# Patient Record
Sex: Male | Born: 1945 | Race: White | Hispanic: No | Marital: Married | Smoking: Former smoker
Health system: Southern US, Community
[De-identification: ages and names within clinical notes are randomized; demographics above are authoritative.]

## PROBLEM LIST (undated history)

## (undated) DIAGNOSIS — N529 Male erectile dysfunction, unspecified: Secondary | ICD-10-CM

## (undated) DIAGNOSIS — G4730 Sleep apnea, unspecified: Secondary | ICD-10-CM

## (undated) DIAGNOSIS — F329 Major depressive disorder, single episode, unspecified: Secondary | ICD-10-CM

## (undated) DIAGNOSIS — C44300 Unspecified malignant neoplasm of skin of unspecified part of face: Secondary | ICD-10-CM

## (undated) DIAGNOSIS — R972 Elevated prostate specific antigen [PSA]: Secondary | ICD-10-CM

## (undated) DIAGNOSIS — F32A Depression, unspecified: Secondary | ICD-10-CM

## (undated) DIAGNOSIS — C801 Malignant (primary) neoplasm, unspecified: Secondary | ICD-10-CM

## (undated) DIAGNOSIS — E119 Type 2 diabetes mellitus without complications: Secondary | ICD-10-CM

## (undated) DIAGNOSIS — E7800 Pure hypercholesterolemia, unspecified: Secondary | ICD-10-CM

## (undated) DIAGNOSIS — F419 Anxiety disorder, unspecified: Secondary | ICD-10-CM

## (undated) DIAGNOSIS — M1990 Unspecified osteoarthritis, unspecified site: Secondary | ICD-10-CM

## (undated) HISTORY — PX: COLONOSCOPY: SHX174

## (undated) HISTORY — DX: Sleep apnea, unspecified: G47.30

## (undated) HISTORY — PX: CHOLECYSTECTOMY: SHX55

## (undated) HISTORY — PX: EYE SURGERY: SHX253

## (undated) HISTORY — PX: APPENDECTOMY: SHX54

## (undated) HISTORY — PX: ROTATOR CUFF REPAIR: SHX139

## (undated) HISTORY — PX: TONSILLECTOMY: SUR1361

---

## 1998-04-24 ENCOUNTER — Encounter: Admission: RE | Admit: 1998-04-24 | Discharge: 1998-07-23 | Payer: Self-pay | Admitting: Endocrinology

## 2000-07-27 ENCOUNTER — Ambulatory Visit (HOSPITAL_BASED_OUTPATIENT_CLINIC_OR_DEPARTMENT_OTHER): Admission: RE | Admit: 2000-07-27 | Discharge: 2000-07-27 | Payer: Self-pay | Admitting: Surgical Oncology

## 2000-07-27 ENCOUNTER — Encounter: Payer: Self-pay | Admitting: Pulmonary Disease

## 2000-09-07 ENCOUNTER — Encounter: Payer: Self-pay | Admitting: Pulmonary Disease

## 2000-09-07 ENCOUNTER — Ambulatory Visit (HOSPITAL_BASED_OUTPATIENT_CLINIC_OR_DEPARTMENT_OTHER): Admission: RE | Admit: 2000-09-07 | Discharge: 2000-09-07 | Payer: Self-pay | Admitting: Pulmonary Disease

## 2000-09-08 ENCOUNTER — Encounter: Payer: Self-pay | Admitting: Pulmonary Disease

## 2000-10-14 ENCOUNTER — Encounter: Payer: Self-pay | Admitting: Pulmonary Disease

## 2001-01-12 ENCOUNTER — Encounter: Payer: Self-pay | Admitting: Pulmonary Disease

## 2001-07-05 ENCOUNTER — Encounter: Payer: Self-pay | Admitting: Pulmonary Disease

## 2008-06-19 ENCOUNTER — Ambulatory Visit: Payer: Self-pay | Admitting: Pulmonary Disease

## 2008-06-19 DIAGNOSIS — E119 Type 2 diabetes mellitus without complications: Secondary | ICD-10-CM

## 2008-06-19 DIAGNOSIS — G4733 Obstructive sleep apnea (adult) (pediatric): Secondary | ICD-10-CM

## 2008-07-21 ENCOUNTER — Encounter: Payer: Self-pay | Admitting: Pulmonary Disease

## 2009-06-19 ENCOUNTER — Ambulatory Visit: Payer: Self-pay | Admitting: Internal Medicine

## 2009-06-29 ENCOUNTER — Encounter: Payer: Self-pay | Admitting: Pulmonary Disease

## 2010-05-15 ENCOUNTER — Encounter (INDEPENDENT_AMBULATORY_CARE_PROVIDER_SITE_OTHER): Payer: Self-pay | Admitting: *Deleted

## 2010-06-18 ENCOUNTER — Ambulatory Visit: Payer: Self-pay | Admitting: Internal Medicine

## 2011-01-05 NOTE — Letter (Signed)
Summary: Colonoscopy Date Change Letter  [facility]  [address] [place], [place] [address]   Phone: [phone]  Fax: [phone]      May 15, 2010 MRN: [id number]   Jermaine Waters [address] [place], [place]  [address]   Dear [name],   Previously you were recommended to have a repeat colonoscopy around this time. Your chart was recently reviewed by Dr. Judie Petit T. Russella Dar of [facility]. Follow up colonoscopy is now recommended in July 2014. This revised recommendation is based on current, nationally recognized guidelines for colorectal cancer screening and polyp surveillance. These guidelines are endorsed by the American Cancer Society, [employer] on Colorectal Cancer as well as numerous other major medical organizations.  Please understand that our recommendation assumes that you do not have any new symptoms such as bleeding, a change in bowel habits, anemia, or significant abdominal discomfort. If you do have any concerning GI symptoms or want to discuss the guideline recommendations, please call to arrange an office visit at your earliest convenience. Otherwise we will keep you in our reminder system and contact you 1-2 months prior to the date listed above to schedule your next colonoscopy.  Thank you,  Judie Petit T. Russella Dar, [name]  [facility] [phone]

## 2011-01-05 NOTE — Assessment & Plan Note (Signed)
Summary: f/u 1 yr///kp   Primary Provider/Referring Provider:  Eloise Harman  CC:  1 year follow up visit-sleep.  History of Present Illness:  06-24-09- OSA- for Dr Shelle Iron Replacement machine was noisier- bothers wife and also more dry mouth. nasal pilows. Same mask on old machine is less associated with open dry mouth. Humidifiers work. We believe pressure is 8 cwp/ [place]. Feels he needs ritalin 10mg  qid every day for residual daytime somnolence.  June 18, 2010- OSA- Again seen in Dr Teddy Spike absence Med list gives 2 different pressures and DME companies so I will have this checked. He says his wife is upset if he takes the mask off- a sign that it makes a difference. He would like not to need it, but does use his CPAP all night every night and denies significant daytime sleepiness. Denies discomfort, but it does restrict his movement some. Pressure is at 9 cwp.     Preventive Screening-Counseling & Management  Alcohol-Tobacco     Smoking Status: quit     Year Quit: 1981     Pack years: 1965  Allergies (verified): No Known Drug Allergies  Past History:  Past Medical History: Last updated: 06-24-09 DM (ICD-250.00) SLEEP APNEA (ICD-780.57)    Past Surgical History: Last updated: 06-24-09  appendectomy  cholecystectomy Tonsillectomy  Family History: Last updated: 2009-06-24 Father- died esophageal/ gastric cancer Mother- died metastatic cancer  Social History: Last updated: June 24, 2009 Patient states former smoker.  Married Real estate for [employer]  Risk Factors: Smoking Status: quit (06/18/2010)  Social History: Pack years:  1965  Review of Systems      See HPI  The patient denies shortness of breath with activity, shortness of breath at rest, productive cough, non-productive cough, coughing up blood, chest pain, irregular heartbeats, acid heartburn, indigestion, loss of appetite, weight change, abdominal pain, difficulty swallowing, sore throat,  tooth/dental problems, headaches, nasal congestion/difficulty breathing through nose, and sneezing.    Vital Signs:  Patient profile:   65 year old male Height:      65 inches Weight:      176.38 pounds BMI:     29.46 O2 Sat:      96 % on Room air Pulse rate:   90 / minute BP sitting:   160 / 52  (right arm) Cuff size:   regular  Vitals Entered By: Reynaldo Minium CMA (June 18, 2010 9:04 AM)  O2 Flow:  Room air CC: 1 year follow up visit-sleep   Physical Exam  Additional Exam:  General: A/Ox3; pleasant and cooperative, NAD, , calm, pleasant affect SKIN: no rash, lesions NODES: no lymphadenopathy HEENT: [place]/AT, EOM- WNL, Conjuctivae- clear, PERRLA, TM-WNL, Nose- clear, Throat- clear and wnl, Mallampati III NECK: Supple w/ fair ROM, JVD- none, normal carotid impulses w/o bruits Thyroid- normal to palpation, no stridor CHEST: Clear to P&A HEART: RRR, no m/g/r heard ABDOMEN: Soft and nl;  UJW:JXBJ, nl pulses, no edema  NEURO: Grossly intact to observation      Impression & Recommendations:  Problem # 1:  HYPERSOMNIA, ASSOCIATED WITH SLEEP APNEA (ICD-780.53)  Good control clinically, and continued good compliance with CPAP. He has 2 pressures and DME companies- I will have that cleared up. We discussed alternative treatments, but will stick with CPAP now. Verified DME + HCS ([facility] plus [facility])  Medications Added to Medication List This Visit: 1)  Humalog 100 Unit/ml Soln (Insulin lispro (human)) .... Sliding scale 2)  Cpap 9 Cwp [facility]   Other Orders: Est.  Patient Level III ([address]) DME Referral (DME)  Patient Instructions: 1)  Please schedule a follow-up appointment in 1 year. 2)  Continue CPAP    Appended Document: f/u 1 yr///kp-med list update-faxed from pharmacy    Clinical Lists Changes  Medications: Changed medication from * VYTORIN (unknown dosage) Take 1 tablet by mouth once a day to VYTORIN 10-20 MG TABS (EZETIMIBE-SIMVASTATIN) take 1  by mouth once daily Removed medication of * FLEXERIL (unknown dosage) Take 1 tablet by mouth once a day Changed medication from * METHYLPHENIDATE  10 MG Take 1 tablet by mouth four times a day to METHYLIN 10 MG TABS (METHYLPHENIDATE HCL) take 4 by mouth once daily as needed Changed medication from * ALTACE (unknown dosage) Take 1 tablet by mouth once a day to RAMIPRIL 2.5 MG CAPS (RAMIPRIL) take 1 by mouth once daily Added new medication of WELLBUTRIN SR 100 MG XR12H-TAB (BUPROPION HCL) take 1 by mouth once daily Added new medication of NEURONTIN 300 MG CAPS (GABAPENTIN) take 1 by mouth two times a day Added new medication of CIALIS 5 MG TABS (TADALAFIL) Use as directed as needed

## 2011-02-11 ENCOUNTER — Ambulatory Visit: Payer: Self-pay | Admitting: Sports Medicine

## 2011-03-02 ENCOUNTER — Ambulatory Visit (INDEPENDENT_AMBULATORY_CARE_PROVIDER_SITE_OTHER): Payer: BC Managed Care – PPO | Admitting: Sports Medicine

## 2011-03-02 ENCOUNTER — Encounter: Payer: Self-pay | Admitting: Sports Medicine

## 2011-03-02 VITALS — BP 130/60 | Ht 66.0 in | Wt 175.0 lb

## 2011-03-02 DIAGNOSIS — M1990 Unspecified osteoarthritis, unspecified site: Secondary | ICD-10-CM | POA: Insufficient documentation

## 2011-03-02 DIAGNOSIS — M25519 Pain in unspecified shoulder: Secondary | ICD-10-CM | POA: Insufficient documentation

## 2011-03-02 DIAGNOSIS — M19019 Primary osteoarthritis, unspecified shoulder: Secondary | ICD-10-CM

## 2011-03-02 MED ORDER — TRAMADOL HCL 50 MG PO TABS: 50.0000 mg | ORAL_TABLET | Freq: Two times a day (BID) | ORAL | Status: DC | PRN

## 2011-03-02 NOTE — Assessment & Plan Note (Addendum)
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 He is to work on his posture. He should do these exercises and try using tramadol 50 mg as needed for pain.   I would like to recheck him in 6 weeks.

## 2011-03-02 NOTE — Progress Notes (Signed)
  Subjective:    Patient ID: Jermaine Waters, male    DOB: 10/19/1946, 65 y.o.   MRN: [id number]  HPI Initially had difficulty with shoulder 30 years ago unsure of particular injury        Recently in past 6 months has noticed increased pain , pain described as burning sensation and sharp pain when his shoulder catches     No swelling,no brusing         Aggravated by with lateral elevation and and internal rotation         Used NSAIDS, now taking 650 mg of aspirin at bedtime for pain        Seen by Dr. Amanda Pea initially in Jan 2012- told he had spurs and inflammation in the shoulder- given CSI, Voltaren gel, given home exercises did not do them often. Has some improvement with injection for approx 30 days          No specific exercise routine currently      Review of Systems Denies weakness in arms, no tingling or numbness in arms/hands      Objective:   Physical Exam   GEN- NAD, alert and oriented  Shoulder: Right Inspection reveals no abnormalities, atrophy or asymmetry. Note mild winging of scapula with elevation to 90 degrees Palpation is normal with no tenderness over AC joint or bicipital groove.  TTP posterior aspect of deltoid ROM is full in all planes. Rotator cuff strength normal throughout. negative Neer , positive  Hawkin's tests, positive  empty can. Speeds and Yergason's tests normal. No painful arc and no drop arm sign.      MSK Ultrasound- No impingment noted, normal supraspinatus, subscap, infraspinatus, biceps tendon in groove, DJD with spurring noted on inferior aspect of glenohumerol joint, no effusion noted  Assessment & Plan:

## 2011-03-02 NOTE — Assessment & Plan Note (Signed)
See instructions above under DJD. Note that at present time we have only really evaluated his right shoulder.

## 2011-03-02 NOTE — Patient Instructions (Signed)
Use tramadol for pain twice a day as needed Shoulder blade touches: start with 3lb weights  waist level   (3 sets 15 ) Fly level- 45 degrees  (3 sets 15 ) Arm straight- back to 90 degrees  (3 sets 15) We see spurs in the posterior shoulder which catches and causes pain Your rotator cuff is normal Follow up in 6 weeks

## 2011-03-02 NOTE — Assessment & Plan Note (Signed)
DJD right shoulder Start ultram Exercises given RTC 6 weeks   Milinda Antis MD, PGY-3

## 2011-03-30 ENCOUNTER — Other Ambulatory Visit: Payer: Self-pay | Admitting: Dermatology

## 2011-04-19 ENCOUNTER — Ambulatory Visit (INDEPENDENT_AMBULATORY_CARE_PROVIDER_SITE_OTHER): Payer: BC Managed Care – PPO | Admitting: Sports Medicine

## 2011-04-19 ENCOUNTER — Encounter: Payer: Self-pay | Admitting: Sports Medicine

## 2011-04-19 VITALS — BP 137/72

## 2011-04-19 DIAGNOSIS — M25569 Pain in unspecified knee: Secondary | ICD-10-CM

## 2011-04-19 DIAGNOSIS — M25562 Pain in left knee: Secondary | ICD-10-CM | POA: Insufficient documentation

## 2011-04-19 DIAGNOSIS — M19019 Primary osteoarthritis, unspecified shoulder: Secondary | ICD-10-CM

## 2011-04-19 NOTE — Progress Notes (Signed)
  Subjective:    Patient ID: Jermaine Waters, male    DOB: [birth year], 65 y.o.   MRN: [id number]  HPI Pt is here today to F/U his R shoulder pain which he feels is about 30% better than before. He feels the exercises have helped him a lot but has noticed the pain he does, from time-to-time, experience is more anteriorly and near the subscap/pec area.   Has done exercises as suggested.  Has done one to two times daily.  Tramadol - takes once daily. Not clear if very helpful.  Pain is bad in morning and improves with activity.  Left Knee pain After sitting; hurts going down stairs. No acute injury Does not get swelling locking or giving way.   Review of Systems     Objective:   Physical Exam pleasant, NAD   Shoulder: Inspection reveals no abnormalities, atrophy or asymmetry. Palpation is normal with no tenderness over AC joint or bicipital groove. ROM is full in all planes. Rotator cuff strength normal throughout except in positions of impingement  + signs of impingement with Neer and Hawkin's tests, empty can is weak Speeds and Yergason's tests normal. No labral pathology noted with negative Obrien's, negative clunk and good stability. Normal scapular function observed. No painful arc and no drop arm sign. No apprehension sign  Knee: Normal to inspection with no erythema or effusion or obvious bony abnormalities. Palpation normal with no warmth or joint line tenderness or patellar tenderness or condyle tenderness. ROM normal in flexion and extension and lower leg rotation. Ligaments with solid consistent endpoints including ACL, PCL, LCL, MCL. Negative Mcmurray's and provocative meniscal tests.  He has painful patellar compression. Moderate crepitation in superior out quad patella Weak VMO and mildly weak at hip abduction  Patellar and quadriceps tendons unremarkable. Hamstring and quadriceps strength is normal.      Assessment & Plan:

## 2011-04-19 NOTE — Assessment & Plan Note (Signed)
He continues to make progress with his shoulder. I asked him to continue the exercises at least once daily. He can continue to use the tramadol and in fact could take more than one a day he currently uses. He did not need injection today based on his lower level of pain. He can recheck regarding this problem in 3-4 months

## 2011-04-19 NOTE — Assessment & Plan Note (Signed)
I reassured him that I did not find significant arthritis in the knee. I do think he needs to work on quadriceps strengthening. He was given some rehabilitation exercises to use for the knee and encouraged to do some biking. This seems like classic patellofemoral pain but if no resolution after 3 months I would be happy to recheck and see if he needs other treatment.

## 2011-06-15 ENCOUNTER — Other Ambulatory Visit: Payer: Self-pay | Admitting: Family Medicine

## 2011-06-16 ENCOUNTER — Encounter: Payer: Self-pay | Admitting: *Deleted

## 2011-06-16 ENCOUNTER — Encounter: Payer: Self-pay | Admitting: Internal Medicine

## 2011-06-18 ENCOUNTER — Encounter: Payer: Self-pay | Admitting: Internal Medicine

## 2011-06-18 ENCOUNTER — Ambulatory Visit (INDEPENDENT_AMBULATORY_CARE_PROVIDER_SITE_OTHER): Payer: BC Managed Care – PPO | Admitting: Internal Medicine

## 2011-06-18 VITALS — BP 114/54 | HR 80 | Ht 66.0 in | Wt 175.2 lb

## 2011-06-18 DIAGNOSIS — G4730 Sleep apnea, unspecified: Secondary | ICD-10-CM

## 2011-06-18 DIAGNOSIS — G4710 Hypersomnia, unspecified: Secondary | ICD-10-CM

## 2011-06-18 DIAGNOSIS — G4733 Obstructive sleep apnea (adult) (pediatric): Secondary | ICD-10-CM

## 2011-06-18 NOTE — Progress Notes (Signed)
  Subjective:    Patient ID: Jermaine Waters, male    DOB: [birth year], 65 y.o.   MRN: [id number]  HPI 06/18/11- 64 yoM followed for OSA complicated by DM and arthritis Last here 06/19/09- note reviewed Was fully compliant with CPAP 9 at last visit.  Now says CPAP not being used regularly. He got tired of not being able to sleep unencumbered. He tried an oral appliance for awhile, then tried just sleeping off flat of back. Wife wants him in the other room- snoring. He notes he stops breathing if he sits quietly. Admits daytime drowsiness.   Aware of deviated septum, but not bothered by it and not considering surgery.  Review of Systems Constitutional:   No weight loss, night sweats,  Fevers, chills, fatigue, lassitude. HEENT:   No headaches,  Difficulty swallowing,  Tooth/dental problems,  Sore throat,                No sneezing, itching, ear ache, nasal congestion, post nasal drip,   CV:  No chest pain,  Orthopnea, PND, swelling in lower extremities, anasarca, dizziness, palpitations  GI  No heartburn, indigestion, abdominal pain, nausea, vomiting, diarrhea, change in bowel habits, loss of appetite  Resp: No shortness of breath with exertion or at rest.  No excess mucus, no productive cough,  No non-productive cough,  No coughing up of blood.  No change in color of mucus.  No wheezing.    Skin: no rash or lesions.  GU: no dysuria, change in color of urine, no urgency or frequency.  No flank pain.  MS:  No joint pain or swelling.  No decreased range of motion.  No back pain.  Psych:  No change in mood or affect. No depression or anxiety.  No memory loss.      Objective:   Physical Exam General- Alert, Oriented, Affect-appropriate, Distress- none acute   Handlebar moustache  Skin- rash-none, lesions- none, excoriation- none  Lymphadenopathy- none  Head- atraumatic  Eyes- Gross vision intact, PERRLA, conjunctivae clear, secretions  Ears- Hearing, canals, Tm- normal  Nose- Clear,  No- , mucus, polyps, erosion, perforation    Septal deviation, narrower on left  Throat- Mallampati III , mucosa clear , drainage- none, tonsils- atrophic  Neck- flexible , trachea midline, no stridor , thyroid nl, carotid no bruit  Chest - symmetrical excursion , unlabored     Heart/CV- RRR , no murmur , no gallop  , no rub, nl s1 s2                     - JVD- none , edema- none, stasis changes- none, varices- none     Lung- clear to P&A, wheeze- none, cough- none , dullness-none, rub- none     Chest wall-   Abd- tender-no, distended-no, bowel sounds-present, HSM- no  Br/ Gen/ Rectal- Not done, not indicated  Extrem- cyanosis- none, clubbing, none, atrophy- none, strength- nl  Neuro- grossly intact to observation          Assessment & Plan:

## 2011-06-18 NOTE — Patient Instructions (Signed)
Order- [facility] schedule split protocol NPSG    Dx OSA

## 2011-06-18 NOTE — Assessment & Plan Note (Addendum)
He is intelligent and making his own choices. CPAP worked well, he just tired of using it.  We discussed the medical issues of untreated OSA and  his awareness of what may be central respiratory events at times, rather than obstructive.. We are going to update his sleep study.

## 2011-06-22 ENCOUNTER — Encounter: Payer: Self-pay | Admitting: Internal Medicine

## 2011-06-30 ENCOUNTER — Encounter: Payer: Self-pay | Admitting: Internal Medicine

## 2011-07-01 ENCOUNTER — Ambulatory Visit (HOSPITAL_BASED_OUTPATIENT_CLINIC_OR_DEPARTMENT_OTHER): Payer: BC Managed Care – PPO | Attending: Internal Medicine

## 2011-07-01 DIAGNOSIS — G4737 Central sleep apnea in conditions classified elsewhere: Secondary | ICD-10-CM | POA: Insufficient documentation

## 2011-07-01 DIAGNOSIS — G4761 Periodic limb movement disorder: Secondary | ICD-10-CM | POA: Insufficient documentation

## 2011-07-01 DIAGNOSIS — G4733 Obstructive sleep apnea (adult) (pediatric): Secondary | ICD-10-CM | POA: Insufficient documentation

## 2011-07-04 DIAGNOSIS — G4761 Periodic limb movement disorder: Secondary | ICD-10-CM

## 2011-07-04 DIAGNOSIS — G4737 Central sleep apnea in conditions classified elsewhere: Secondary | ICD-10-CM

## 2011-07-04 DIAGNOSIS — G4733 Obstructive sleep apnea (adult) (pediatric): Secondary | ICD-10-CM

## 2011-07-04 NOTE — Procedures (Signed)
Jermaine Waters, Jermaine Waters              ACCOUNT NO.:  [id number]  MEDICAL RECORD NO.:  [id number]          PATIENT TYPE:  OUT  LOCATION:  SLEEP CENTER                 FACILITY:  [facility], [place]  PHYSICIAN:  Terez Freimark D. Maple Hudson, MD, FCCP, FACPDATE OF BIRTH:  Mar 22, 1946  DATE OF STUDY:  07/01/2011                           NOCTURNAL POLYSOMNOGRAM  REFERRING PHYSICIAN:  Garhett Bernhard D Miyana Mordecai  REFERRING PHYSICIAN:  Berkeley Veldman D. Dayle Mcnerney, MD, FCCP, FACP  INDICATIONS FOR STUDY:  Hypersomnia with sleep apnea.  EPWORTH SLEEPINESS SCORE:  18/24, BMI 28.2.  Weight 175 pounds, height 66 inches.  Neck 16 inches.  Home medications are charted and reviewed.  SLEEP ARCHITECTURE:  Split study protocol.  During the diagnostic phase, total sleep time 137 minutes with sleep efficiency 89.3%.  Stage I was 12%, stage II 81.4%, stage III absent, REM 6.6% of total sleep time. Sleep latency 2.5 minutes, REM latency 140.5 minutes, awake after sleep onset 14 minutes, arousal index 70.9.  BEDTIME MEDICATION:  None.  RESPIRATORY DATA:  Split study protocol.  Apnea/hypopnea index (AHI) 48.6 per hour.  A total of 111 events was scored including 8 obstructive apneas, 6 central apneas, 1 mixed apnea, 96 hypopneas.  Events were recorded with supine sleep position.  REM AHI 46.7 per hour.  CPAP was then titrated to 7 cwp, AHI 8.1 per hour.  Residual events were central apneas.  He wore a medium ResMed Mirage FX mask with heated humidifier.  OXYGEN DATA:  Moderately loud snoring before CPAP with oxygen desaturation to a nadir of 80% on room air.  With CPAP titration, mean oxygen saturation held 96% on room air and snoring was prevented.  CARDIAC DATA:  Normal sinus rhythm.  MOVEMENT/PARASOMNIA:  Periodic limb movement with arousal.  During the diagnostic phase, 24 limb jerks were associated with 10 arousal events for an index of 4.4 per hour.  During the titration phase, 117 limb jerks were associated with 41 arousals for an index of 12  per hour.  No bathroom trips.  IMPRESSION/RECOMMENDATIONS: 1. 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 2. CPAP titration to 7 cwp, AHI 8.1 per hour.  Residual events were     central apneas.  He wore a medium ResMed Mirage FX mask with heated     humidifier.  Snoring was eliminated and oxygen saturation improved. 3. Periodic limb movement with arousal.  Limb movement with arousal     index of 4.4 per hour during the     diagnostic phase and 12 per hour during the titration phase.     Consider whether this is clinically significant in the home     environment.     Jermaine Waters D. Maple Hudson, MD, [facility], FACP Diplomate, Biomedical engineer of Sleep Medicine Electronically Signed    CDY/MEDQ  D:  07/04/2011 09:56:21  T:  07/04/2011 20:22:43  Job:  [id number]

## 2011-07-10 ENCOUNTER — Other Ambulatory Visit: Payer: Self-pay | Admitting: Family Medicine

## 2011-07-12 NOTE — Telephone Encounter (Signed)
Wrong provider. Please send to [facility]	[place]

## 2011-07-13 ENCOUNTER — Other Ambulatory Visit: Payer: Self-pay | Admitting: *Deleted

## 2011-07-13 MED ORDER — TRAMADOL HCL 50 MG PO TABS: 50.0000 mg | ORAL_TABLET | Freq: Two times a day (BID) | ORAL | Status: DC | PRN

## 2011-07-19 ENCOUNTER — Encounter: Payer: Self-pay | Admitting: Internal Medicine

## 2011-07-19 ENCOUNTER — Ambulatory Visit (INDEPENDENT_AMBULATORY_CARE_PROVIDER_SITE_OTHER): Payer: BC Managed Care – PPO | Admitting: Internal Medicine

## 2011-07-19 VITALS — BP 110/60 | HR 85 | Ht 66.0 in | Wt 178.2 lb

## 2011-07-19 DIAGNOSIS — G4710 Hypersomnia, unspecified: Secondary | ICD-10-CM

## 2011-07-19 DIAGNOSIS — G4733 Obstructive sleep apnea (adult) (pediatric): Secondary | ICD-10-CM

## 2011-07-19 NOTE — Assessment & Plan Note (Signed)
Obstructive and central apnea. The CPAP control for OSA seemed adequate at 5. For the comfort of lower pressure, we will try that, down from his current 11.  He used CPAP for 8 years and disliked the fact he couldn't sleep on stomach as preferred.  He tried oral appliance and abandoned that when it got uncomfortable  Considered and discussed Vivacvtil He chooses to try CPAP again

## 2011-07-19 NOTE — Progress Notes (Signed)
Subjective:    Patient ID: Jermaine Waters, male    DOB: 1946-11-11, 65 y.o.   MRN: [id number]  HPI  Subjective:    Patient ID: Jermaine Waters, male    DOB: [birth year], 65 y.o.   MRN: [id number]  HPI 06/18/11- 64 yoM followed for OSA complicated by DM and arthritis Last here 06/19/09- note reviewed Was fully compliant with CPAP 9 at last visit.  Now says CPAP not being used regularly. He got tired of not being able to sleep unencumbered. He tried an oral appliance for awhile, then tried just sleeping off flat of back. Wife wants him in the other room- snoring. He notes he stops breathing if he sits quietly. Admits daytime drowsiness.   Aware of deviated septum, but not bothered by it and not considering surgery.   07/19/11-  64 yoM followed for OSA complicated by DM and arthritis NPSG 07/01/11 with residual apneas. 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 CPAP titration to 7 gave an AHI of 8.1 per hour -report reviewed with him in detail.  Extra time was spent discussing the medical implications and available treatments. We discussed his experience with CPAP masks and oral appliances previously.  Review of Systems Constitutional:   No weight loss, night sweats,  Fevers, chills, fatigue, lassitude. HEENT:   No headaches,  Difficulty swallowing,  Tooth/dental problems,  Sore throat,                No sneezing, itching, ear ache, nasal congestion, post nasal drip,  CV:  No chest pain,  Orthopnea, PND, swelling in lower extremities, anasarca, dizziness, palpitations GI  No heartburn, indigestion, abdominal pain, nausea, vomiting, diarrhea, change in bowel habits, loss of appetite Resp: No shortness of breath with exertion or at rest.  No excess mucus, no productive cough,  No non-productive cough,  No coughing up of blood.  No change in color of mucus.  No wheezing.   Skin: no rash or lesions. GU: no dysuria, change in color of urine, no urgency or frequency.   No flank pain. MS:  No joint pain or swelling.  No decreased range of motion.  No back pain. Psych:  No change in mood or affect. No depression or anxiety.  No memory loss.   Objective:   Physical Exam General- Alert, Oriented, Affect-appropriate, Distress- none acute   handlebar mustache Skin- rash-none, lesions- none, excoriation- none Lymphadenopathy- none Head- atraumatic            Eyes- Gross vision intact, PERRLA, conjunctivae clear secretions            Ears- Hearing, canals normal            Nose- Clear, no-Septal dev, mucus, polyps, erosion, perforation             Throat- Mallampati III , mucosa clear , drainage- none, tonsils- atrophic Neck- flexible , trachea midline, no stridor , thyroid nl, carotid no bruit Chest - symmetrical excursion , unlabored           Heart/CV- RRR , no murmur , no gallop  , no rub, nl s1 s2                           - JVD- none , edema- none, stasis changes- none, varices- none           Lung- clear to P&A, wheeze- none, cough- none , dullness-none, rub- none  Chest wall-  Abd- tender-no, distended-no, bowel sounds-present, HSM- no Br/ Gen/ Rectal- Not done, not indicated Extrem- cyanosis- none, clubbing, none, atrophy- none, strength- nl Neuro- grossly intact to observation           Assessment & Plan:     Review of Systems     Objective:   Physical Exam        Assessment & Plan:

## 2011-07-19 NOTE — Patient Instructions (Signed)
Order- [facility]- new CPAP 5 cwp, mask of choice, humidifier dx OSA                                          (Look at nasal pillow masks as one option)

## 2011-08-25 ENCOUNTER — Ambulatory Visit (INDEPENDENT_AMBULATORY_CARE_PROVIDER_SITE_OTHER): Payer: Self-pay | Admitting: Ophthalmology

## 2011-08-25 DIAGNOSIS — E11319 Type 2 diabetes mellitus with unspecified diabetic retinopathy without macular edema: Secondary | ICD-10-CM

## 2011-08-25 DIAGNOSIS — H2510 Age-related nuclear cataract, unspecified eye: Secondary | ICD-10-CM

## 2011-08-25 DIAGNOSIS — E11359 Type 2 diabetes mellitus with proliferative diabetic retinopathy without macular edema: Secondary | ICD-10-CM

## 2011-08-25 DIAGNOSIS — H43819 Vitreous degeneration, unspecified eye: Secondary | ICD-10-CM

## 2011-08-27 ENCOUNTER — Other Ambulatory Visit: Payer: Self-pay | Admitting: *Deleted

## 2011-08-27 DIAGNOSIS — M25562 Pain in left knee: Secondary | ICD-10-CM

## 2011-08-31 ENCOUNTER — Encounter: Payer: Self-pay | Admitting: *Deleted

## 2011-08-31 ENCOUNTER — Telehealth: Payer: Self-pay | Admitting: *Deleted

## 2011-08-31 NOTE — Telephone Encounter (Signed)
Message copied by Mora Bellman on Tue Aug 31, 2011  4:58 PM ------      Message from: Enid Baas      Created: Mon Aug 30, 2011  8:47 AM      Regarding: RE: patient is requesting getting an mri before his appt here 10/1      Contact: [phone]       If he really isn't much better I think the next step may be to refer him to see a shoulder surgeon who may want to repeat plain Xrays or do special views before an MRI.  We found an area of DJD that MRI does not show this that well.  So maybe we should refer rather than do MRI and follow up visit on Oct 1      ----- Message -----         From: Resa Miner, RN         Sent: 08/27/2011  10:49 AM           To: Enid Baas, MD      Subject: FW: patient is requesting getting an mri bef#            Pt states his shoulder has gotten any better, and at last visit you said next step was MRI.  If you want him to have the MRI, he wants to have it before his appt Oct. 1.             ----- Message -----         From: Lizbeth Bark         Sent: 08/26/2011   4:39 PM           To: Rolfe Hartsell Phillis Haggis, RN      Subject: patient is requesting getting an mri before #

## 2011-08-31 NOTE — Telephone Encounter (Signed)
Pt discussed with Dr. Darrick Penna and decided he wants to try acupuncture before seeing the surgeon.  He had already seen Dr. Amanda Pea before coming to our office for 2nd opinion.  Referral [facility] faxed to [facility].

## 2011-09-06 ENCOUNTER — Ambulatory Visit: Payer: BC Managed Care – PPO | Admitting: Sports Medicine

## 2011-09-08 ENCOUNTER — Encounter (INDEPENDENT_AMBULATORY_CARE_PROVIDER_SITE_OTHER): Payer: BC Managed Care – PPO | Admitting: Ophthalmology

## 2011-09-08 DIAGNOSIS — E11359 Type 2 diabetes mellitus with proliferative diabetic retinopathy without macular edema: Secondary | ICD-10-CM

## 2011-09-09 ENCOUNTER — Ambulatory Visit: Payer: BC Managed Care – PPO | Admitting: Internal Medicine

## 2011-10-12 ENCOUNTER — Ambulatory Visit: Payer: BC Managed Care – PPO | Admitting: Sports Medicine

## 2011-10-13 ENCOUNTER — Telehealth: Payer: Self-pay | Admitting: *Deleted

## 2011-10-13 NOTE — Telephone Encounter (Signed)
Dr. Darrick Penna reviewed pt's shoulder x-ray and said he had spurring, and next step for treatment would be surgery consultation.

## 2011-10-13 NOTE — Telephone Encounter (Signed)
Message copied by Mora Bellman on Wed Oct 13, 2011 12:14 PM ------      Message from: Enid Baas      Created: Tue Oct 12, 2011 10:09 AM       This pt cancelled w ? Of MRI.      We need to do plane XRay of shoulder first and then recheck.  If diagnosis not confirmed on plane XRay the MRI would probably get approved.      However since we last saw him in May he would have to be rechecked before we could order MRI but not plain Xray

## 2011-10-13 NOTE — Telephone Encounter (Signed)
Pt states he would still like to try accupuncture before going back to surgeon.  Referral has already been faxed to [facility].  Also, asked pt if he was using tramadol for pain- he states he takes it usually in the mornings.  Advised this is a good medication for arthritis, and to call if he needs a refill.  Pt agreeable.

## 2011-10-13 NOTE — Telephone Encounter (Signed)
Message copied by Mora Bellman on Wed Oct 13, 2011 12:28 PM ------      Message from: Enid Baas      Created: Tue Oct 12, 2011 10:09 AM       This pt cancelled w ? Of MRI.      We need to do plane XRay of shoulder first and then recheck.  If diagnosis not confirmed on plane XRay the MRI would probably get approved.      However since we last saw him in May he would have to be rechecked before we could order MRI but not plain Xray

## 2011-12-21 ENCOUNTER — Other Ambulatory Visit: Payer: Self-pay | Admitting: *Deleted

## 2011-12-21 MED ORDER — TRAMADOL HCL 50 MG PO TABS: 50.0000 mg | ORAL_TABLET | Freq: Two times a day (BID) | ORAL | Status: AC | PRN

## 2012-01-10 ENCOUNTER — Ambulatory Visit (INDEPENDENT_AMBULATORY_CARE_PROVIDER_SITE_OTHER): Payer: BC Managed Care – PPO | Admitting: Ophthalmology

## 2012-01-10 DIAGNOSIS — E11359 Type 2 diabetes mellitus with proliferative diabetic retinopathy without macular edema: Secondary | ICD-10-CM

## 2012-01-10 DIAGNOSIS — H2510 Age-related nuclear cataract, unspecified eye: Secondary | ICD-10-CM

## 2012-01-10 DIAGNOSIS — H43819 Vitreous degeneration, unspecified eye: Secondary | ICD-10-CM

## 2012-01-10 DIAGNOSIS — E1065 Type 1 diabetes mellitus with hyperglycemia: Secondary | ICD-10-CM

## 2012-06-09 ENCOUNTER — Telehealth: Payer: Self-pay | Admitting: Internal Medicine

## 2012-06-09 DIAGNOSIS — G4733 Obstructive sleep apnea (adult) (pediatric): Secondary | ICD-10-CM

## 2012-06-09 NOTE — Telephone Encounter (Signed)
Called, spoke with pt.  I informed him of below per Dr. Maple Hudson.  Pt states he had a sleep study done last year and wants to know if this data would be sufficient.  Dr. Maple Hudson, pls advise.  Thank you.

## 2012-06-09 NOTE — Telephone Encounter (Signed)
Per CY-order ONOX on ROOM AIR and his CPAP dx OSA.

## 2012-06-09 NOTE — Telephone Encounter (Signed)
Per CY-no this is not sufficient enough for the issue at hand; patient needs to have ONOX of ROOM AIR and his CPAP dx OSA.

## 2012-06-09 NOTE — Telephone Encounter (Signed)
Called, spoke with pt.  Informed him of below per Dr. Maple Hudson.  He verbalized understanding of this and is aware order will be sent to [facility] who will contact him to set this up.  He verbalized understanding of this and voiced no further questions/concerns at this time.

## 2012-06-09 NOTE — Telephone Encounter (Signed)
Spoke with pt. He states that he has recently had multiple laser surgeries to help fix his retina problems and after consulting with ophthalmologist and doing research on retinal problems, he feels that maybe he is not getting enough o2 and this is the cause of retinal problems. He wants to know if he needs to add o2 to CPAP. Please advise recs thanks!

## 2012-06-13 ENCOUNTER — Ambulatory Visit: Payer: BC Managed Care – PPO | Admitting: Internal Medicine

## 2012-07-06 ENCOUNTER — Encounter: Payer: Self-pay | Admitting: Internal Medicine

## 2012-07-10 ENCOUNTER — Ambulatory Visit (INDEPENDENT_AMBULATORY_CARE_PROVIDER_SITE_OTHER): Payer: BC Managed Care – PPO | Admitting: Ophthalmology

## 2012-07-26 ENCOUNTER — Encounter (INDEPENDENT_AMBULATORY_CARE_PROVIDER_SITE_OTHER): Payer: BC Managed Care – PPO | Admitting: Ophthalmology

## 2012-07-26 DIAGNOSIS — H2510 Age-related nuclear cataract, unspecified eye: Secondary | ICD-10-CM

## 2012-07-26 DIAGNOSIS — E11359 Type 2 diabetes mellitus with proliferative diabetic retinopathy without macular edema: Secondary | ICD-10-CM

## 2012-07-26 DIAGNOSIS — H43819 Vitreous degeneration, unspecified eye: Secondary | ICD-10-CM

## 2012-07-26 DIAGNOSIS — E1065 Type 1 diabetes mellitus with hyperglycemia: Secondary | ICD-10-CM

## 2013-01-26 ENCOUNTER — Ambulatory Visit (INDEPENDENT_AMBULATORY_CARE_PROVIDER_SITE_OTHER): Payer: BC Managed Care – PPO | Admitting: Ophthalmology

## 2013-01-26 DIAGNOSIS — H43819 Vitreous degeneration, unspecified eye: Secondary | ICD-10-CM

## 2013-01-26 DIAGNOSIS — H2510 Age-related nuclear cataract, unspecified eye: Secondary | ICD-10-CM

## 2013-01-26 DIAGNOSIS — E1039 Type 1 diabetes mellitus with other diabetic ophthalmic complication: Secondary | ICD-10-CM

## 2013-01-26 DIAGNOSIS — E11359 Type 2 diabetes mellitus with proliferative diabetic retinopathy without macular edema: Secondary | ICD-10-CM

## 2013-03-27 ENCOUNTER — Encounter (HOSPITAL_COMMUNITY): Payer: Self-pay | Admitting: Pharmacy Technician

## 2013-03-28 NOTE — Pre-Procedure Instructions (Signed)
Jermaine Waters  03/28/2013   Your procedure is scheduled on:  Thursday Apr 05, 2013.  Report to [facility] at 10:30 AM.  Call this number if you have problems the morning of surgery: [phone]   Remember:   Do not eat food or drink liquids after midnight.   Take these medicines the morning of surgery with A SIP OF WATER: Bupropion (Wellbutrin), Methylphenidate (Ritalin)  Do not take any diabetic medications the morning of surgery   Do not wear jewelry  Do not wear lotions or colognes.  Men may shave face and neck.  Do not bring valuables to the hospital.  Contacts, dentures or bridgework may not be worn into surgery.  Leave suitcase in the car. After surgery it may be brought to your room.  For patients admitted to the hospital, checkout time is 11:00 AM the day of  discharge.   Patients discharged the day of surgery will not be allowed to drive  home.  Name and phone number of your driver: Family/Friend  Special Instructions: Shower using CHG 2 nights before surgery and the night before surgery.  If you shower the day of surgery use CHG.  Use special wash - you have one bottle of CHG for all showers.  You should use approximately 1/3 of the bottle for each shower.   Please read over the following fact sheets that you were given: Pain Booklet, Coughing and Deep Breathing, MRSA Information and Surgical Site Infection Prevention

## 2013-03-29 ENCOUNTER — Encounter (HOSPITAL_COMMUNITY)
Admission: RE | Admit: 2013-03-29 | Discharge: 2013-03-29 | Disposition: A | Discharge status: Home / Self Care | Payer: Federal, State, Local not specified - PPO | Source: Ambulatory Visit | Attending: Orthopedic Surgery | Admitting: Orthopedic Surgery

## 2013-03-29 ENCOUNTER — Ambulatory Visit (HOSPITAL_COMMUNITY)
Admission: RE | Admit: 2013-03-29 | Discharge: 2013-03-29 | Disposition: A | Discharge status: Home / Self Care | Payer: Federal, State, Local not specified - PPO | Source: Ambulatory Visit | Attending: Orthopedic Surgery | Admitting: Orthopedic Surgery

## 2013-03-29 ENCOUNTER — Encounter (HOSPITAL_COMMUNITY): Payer: Self-pay

## 2013-03-29 DIAGNOSIS — E119 Type 2 diabetes mellitus without complications: Secondary | ICD-10-CM | POA: Insufficient documentation

## 2013-03-29 DIAGNOSIS — Z01812 Encounter for preprocedural laboratory examination: Secondary | ICD-10-CM | POA: Insufficient documentation

## 2013-03-29 DIAGNOSIS — G4733 Obstructive sleep apnea (adult) (pediatric): Secondary | ICD-10-CM | POA: Insufficient documentation

## 2013-03-29 DIAGNOSIS — Z01818 Encounter for other preprocedural examination: Secondary | ICD-10-CM | POA: Insufficient documentation

## 2013-03-29 DIAGNOSIS — M129 Arthropathy, unspecified: Secondary | ICD-10-CM | POA: Insufficient documentation

## 2013-03-29 DIAGNOSIS — F3289 Other specified depressive episodes: Secondary | ICD-10-CM | POA: Insufficient documentation

## 2013-03-29 DIAGNOSIS — Z01810 Encounter for preprocedural cardiovascular examination: Secondary | ICD-10-CM | POA: Insufficient documentation

## 2013-03-29 DIAGNOSIS — F329 Major depressive disorder, single episode, unspecified: Secondary | ICD-10-CM | POA: Insufficient documentation

## 2013-03-29 DIAGNOSIS — Z87891 Personal history of nicotine dependence: Secondary | ICD-10-CM | POA: Insufficient documentation

## 2013-03-29 DIAGNOSIS — Z85828 Personal history of other malignant neoplasm of skin: Secondary | ICD-10-CM | POA: Insufficient documentation

## 2013-03-29 DIAGNOSIS — E7800 Pure hypercholesterolemia, unspecified: Secondary | ICD-10-CM | POA: Insufficient documentation

## 2013-03-29 HISTORY — DX: Malignant (primary) neoplasm, unspecified: C80.1

## 2013-03-29 HISTORY — DX: Major depressive disorder, single episode, unspecified: F32.9

## 2013-03-29 HISTORY — DX: Unspecified osteoarthritis, unspecified site: M19.90

## 2013-03-29 HISTORY — DX: Depression, unspecified: F32.A

## 2013-03-29 HISTORY — DX: Pure hypercholesterolemia, unspecified: E78.00

## 2013-03-29 HISTORY — DX: Male erectile dysfunction, unspecified: N52.9

## 2013-03-29 LAB — BASIC METABOLIC PANEL
Calcium: 8.8 mg/dL (ref 8.4–10.5)
GFR calc Af Amer: >90 mL/min (ref >90)
GFR calc non Af Amer: >90 mL/min (ref >90)
Glucose, Bld: 289 mg/dL — ABNORMAL HIGH (ref 70–99)
Sodium: 138 mEq/L (ref 135–145)

## 2013-03-29 LAB — CBC
MCH: 30.5 pg (ref 26.0–34.0)
MCHC: 34.9 g/dL (ref 30.0–36.0)
Platelets: 216 10*3/uL (ref 150–400)
RBC: 5.05 MIL/uL (ref 4.22–5.81)

## 2013-03-29 LAB — SURGICAL PCR SCREEN: MRSA, PCR: NEGATIVE

## 2013-03-29 NOTE — Progress Notes (Signed)
Patient denied having any cardiac or pulmonary problems. PCP is Dr. Brunilda Payor at [facility]. Patient instructed not to take any diabetic medications the morning of surgery. Patient verbalized understanding.

## 2013-03-30 NOTE — Progress Notes (Signed)
Chart review: Patient is a 67 year old male scheduled for right shoulder arthroscopy with subacromial decompression, distal clavicle resection, and rotator cuff repair by Dr. Rennis Chris on 04/05/2013. History includes diabetes mellitus type 2, obstructive sleep apnea with CPAP use, hypercholesterolemia, former smoker, ED, depression, arthritis , skin cancer, tonsillectomy, appendectomy, cholecystectomy. PCP is listed as Dr. Jarome Matin.  EKG on 03/29/2013 showed sinus rhythm with PACs.  Chest x-ray on 03/29/2013 showed no edema or consolidation.  Preoperative labs noted. Nonfasting glucose of 289. He will get a fasting CBG on arrival.  If his fasting glucose is not significantly elevated then would anticipate he could proceed as planned.  Velna Ochs [facility]/Anesthesiology Phone [phone] 03/30/2013 3:16 PM

## 2013-04-04 MED ORDER — DEXTROSE 5 % IV SOLN
3.0000 g | INTRAVENOUS | Status: AC
  Administered 2013-04-05: 3 g via INTRAVENOUS
  Filled 2013-04-04: qty 3000

## 2013-04-05 ENCOUNTER — Ambulatory Visit (HOSPITAL_COMMUNITY)
Admission: RE | Admit: 2013-04-05 | Discharge: 2013-04-05 | Disposition: A | Discharge status: Home / Self Care | Payer: Federal, State, Local not specified - PPO | Source: Ambulatory Visit | Attending: Orthopedic Surgery | Admitting: Orthopedic Surgery

## 2013-04-05 ENCOUNTER — Encounter (HOSPITAL_COMMUNITY)
Admission: RE | Disposition: A | Discharge status: Home / Self Care | Payer: Self-pay | Source: Ambulatory Visit | Attending: Orthopedic Surgery

## 2013-04-05 ENCOUNTER — Encounter (HOSPITAL_COMMUNITY): Payer: Self-pay

## 2013-04-05 ENCOUNTER — Encounter (HOSPITAL_COMMUNITY): Payer: Self-pay | Admitting: Vascular Surgery

## 2013-04-05 ENCOUNTER — Ambulatory Visit (HOSPITAL_COMMUNITY): Payer: Federal, State, Local not specified - PPO | Admitting: Anesthesiology

## 2013-04-05 DIAGNOSIS — M19019 Primary osteoarthritis, unspecified shoulder: Secondary | ICD-10-CM | POA: Insufficient documentation

## 2013-04-05 DIAGNOSIS — E119 Type 2 diabetes mellitus without complications: Secondary | ICD-10-CM | POA: Insufficient documentation

## 2013-04-05 DIAGNOSIS — F3289 Other specified depressive episodes: Secondary | ICD-10-CM | POA: Insufficient documentation

## 2013-04-05 DIAGNOSIS — M25819 Other specified joint disorders, unspecified shoulder: Secondary | ICD-10-CM | POA: Insufficient documentation

## 2013-04-05 DIAGNOSIS — M129 Arthropathy, unspecified: Secondary | ICD-10-CM | POA: Insufficient documentation

## 2013-04-05 DIAGNOSIS — F329 Major depressive disorder, single episode, unspecified: Secondary | ICD-10-CM | POA: Insufficient documentation

## 2013-04-05 DIAGNOSIS — E7800 Pure hypercholesterolemia, unspecified: Secondary | ICD-10-CM | POA: Insufficient documentation

## 2013-04-05 DIAGNOSIS — Z794 Long term (current) use of insulin: Secondary | ICD-10-CM | POA: Insufficient documentation

## 2013-04-05 DIAGNOSIS — M719 Bursopathy, unspecified: Secondary | ICD-10-CM | POA: Insufficient documentation

## 2013-04-05 DIAGNOSIS — M67919 Unspecified disorder of synovium and tendon, unspecified shoulder: Secondary | ICD-10-CM | POA: Insufficient documentation

## 2013-04-05 DIAGNOSIS — M24819 Other specific joint derangements of unspecified shoulder, not elsewhere classified: Secondary | ICD-10-CM | POA: Insufficient documentation

## 2013-04-05 DIAGNOSIS — G4730 Sleep apnea, unspecified: Secondary | ICD-10-CM | POA: Insufficient documentation

## 2013-04-05 HISTORY — PX: SHOULDER ARTHROSCOPY WITH ROTATOR CUFF REPAIR AND SUBACROMIAL DECOMPRESSION: SHX5686

## 2013-04-05 LAB — GLUCOSE, CAPILLARY: Glucose-Capillary: 96 mg/dL (ref 70–99)

## 2013-04-05 SURGERY — SHOULDER ARTHROSCOPY WITH ROTATOR CUFF REPAIR AND SUBACROMIAL DECOMPRESSION
Anesthesia: Regional | Site: Shoulder | Laterality: Right | Wound class: Clean

## 2013-04-05 MED ORDER — OXYCODONE HCL 5 MG/5ML PO SOLN: 5.0000 mg | Freq: Once | ORAL | Status: DC | PRN

## 2013-04-05 MED ORDER — PROPOFOL 10 MG/ML IV BOLUS
INTRAVENOUS | Status: DC | PRN
  Administered 2013-04-05: 200 mg via INTRAVENOUS

## 2013-04-05 MED ORDER — OXYCODONE-ACETAMINOPHEN 5-325 MG PO TABS: 1.0000 | ORAL_TABLET | ORAL | Status: DC | PRN

## 2013-04-05 MED ORDER — SODIUM CHLORIDE 0.9 % IR SOLN
Status: DC | PRN
  Administered 2013-04-05: 3000 mL

## 2013-04-05 MED ORDER — FENTANYL CITRATE 0.05 MG/ML IJ SOLN
INTRAMUSCULAR | Status: DC | PRN
  Administered 2013-04-05: 50 ug via INTRAVENOUS

## 2013-04-05 MED ORDER — FENTANYL CITRATE 0.05 MG/ML IJ SOLN: 100.0000 ug | Freq: Once | INTRAMUSCULAR | Status: AC

## 2013-04-05 MED ORDER — ONDANSETRON HCL 4 MG/2ML IJ SOLN
INTRAMUSCULAR | Status: DC | PRN
  Administered 2013-04-05: 4 mg via INTRAVENOUS

## 2013-04-05 MED ORDER — OXYCODONE HCL 5 MG PO TABS: 5.0000 mg | ORAL_TABLET | Freq: Once | ORAL | Status: DC | PRN

## 2013-04-05 MED ORDER — NAPROXEN 500 MG PO TABS: 500.0000 mg | ORAL_TABLET | Freq: Two times a day (BID) | ORAL | Status: DC

## 2013-04-05 MED ORDER — TEMAZEPAM 30 MG PO CAPS: 30.0000 mg | ORAL_CAPSULE | Freq: Every evening | ORAL | Status: DC | PRN

## 2013-04-05 MED ORDER — GLYCOPYRROLATE 0.2 MG/ML IJ SOLN
INTRAMUSCULAR | Status: DC | PRN
  Administered 2013-04-05: 0.4 mg via INTRAVENOUS

## 2013-04-05 MED ORDER — ROCURONIUM BROMIDE 100 MG/10ML IV SOLN
INTRAVENOUS | Status: DC | PRN
  Administered 2013-04-05: 50 mg via INTRAVENOUS

## 2013-04-05 MED ORDER — SODIUM CHLORIDE 0.9 % IV SOLN
10.0000 mg | INTRAVENOUS | Status: DC | PRN
  Administered 2013-04-05: 20 ug/min via INTRAVENOUS

## 2013-04-05 MED ORDER — LACTATED RINGERS IV SOLN
INTRAVENOUS | Status: DC
  Administered 2013-04-05 (×2): via INTRAVENOUS

## 2013-04-05 MED ORDER — BUPIVACAINE-EPINEPHRINE PF 0.5-1:200000 % IJ SOLN
INTRAMUSCULAR | Status: DC | PRN
  Administered 2013-04-05: 30 mL

## 2013-04-05 MED ORDER — FENTANYL CITRATE 0.05 MG/ML IJ SOLN
INTRAMUSCULAR | Status: AC
  Administered 2013-04-05: 100 ug via INTRAVENOUS
  Filled 2013-04-05: qty 2

## 2013-04-05 MED ORDER — MIDAZOLAM HCL 2 MG/2ML IJ SOLN
INTRAMUSCULAR | Status: AC
  Administered 2013-04-05: 2 mg
  Filled 2013-04-05: qty 2

## 2013-04-05 MED ORDER — PHENYLEPHRINE HCL 10 MG/ML IJ SOLN
INTRAMUSCULAR | Status: DC | PRN
  Administered 2013-04-05 (×3): 80 ug via INTRAVENOUS

## 2013-04-05 MED ORDER — CYCLOBENZAPRINE HCL 10 MG PO TABS
10.0000 mg | ORAL_TABLET | Freq: Three times a day (TID) | ORAL | Status: DC | PRN
Start: 2013-04-05 — End: 2014-02-27

## 2013-04-05 MED ORDER — FENTANYL CITRATE 0.05 MG/ML IJ SOLN: 50.0000 ug | INTRAMUSCULAR | Status: DC | PRN

## 2013-04-05 MED ORDER — ACETAMINOPHEN 10 MG/ML IV SOLN
1000.0000 mg | Freq: Four times a day (QID) | INTRAVENOUS | Status: DC
  Administered 2013-04-05: 1000 mg via INTRAVENOUS

## 2013-04-05 MED ORDER — HYDROMORPHONE HCL PF 1 MG/ML IJ SOLN: 0.2500 mg | INTRAMUSCULAR | Status: DC | PRN

## 2013-04-05 MED ORDER — MIDAZOLAM HCL 2 MG/2ML IJ SOLN: 1.0000 mg | INTRAMUSCULAR | Status: DC | PRN

## 2013-04-05 MED ORDER — NEOSTIGMINE METHYLSULFATE 1 MG/ML IJ SOLN
INTRAMUSCULAR | Status: DC | PRN
  Administered 2013-04-05: 3 mg via INTRAVENOUS

## 2013-04-05 MED ORDER — CHLORHEXIDINE GLUCONATE 4 % EX LIQD: 60.0000 mL | Freq: Once | CUTANEOUS | Status: DC

## 2013-04-05 MED ORDER — ACETAMINOPHEN 10 MG/ML IV SOLN
INTRAVENOUS | Status: AC
  Filled 2013-04-05: qty 100

## 2013-04-05 MED ORDER — LIDOCAINE HCL (CARDIAC) 20 MG/ML IV SOLN
INTRAVENOUS | Status: DC | PRN
  Administered 2013-04-05: 75 mg via INTRAVENOUS

## 2013-04-05 MED ORDER — ONDANSETRON HCL 4 MG/2ML IJ SOLN: 4.0000 mg | Freq: Four times a day (QID) | INTRAMUSCULAR | Status: DC | PRN

## 2013-04-05 SURGICAL SUPPLY — 64 items
ANCH SUT 2 CRKSRW FT 14X4.5 (Anchor) ×1 IMPLANT
ANCH SUT SWLK 19.1X4.75 VT (Anchor) ×2 IMPLANT
ANCHOR PEEK 4.75X19.1 SWLK C (Anchor) ×2 IMPLANT
ANCHOR PEEK CORKSCREW 4.5 (Anchor) ×1 IMPLANT
BLADE CUTTER GATOR 3.5 (BLADE) ×2 IMPLANT
BLADE GREAT WHITE 4.2 (BLADE) ×2 IMPLANT
BLADE SURG 11 STRL SS (BLADE) ×2 IMPLANT
BOOTCOVER CLEANROOM LRG (PROTECTIVE WEAR) ×4 IMPLANT
BUR 3.5 LG SPHERICAL (BURR) IMPLANT
BUR OVAL 4.0 (BURR) ×2 IMPLANT
BURR 3.5 LG SPHERICAL (BURR)
CANISTER SUCT LVC 12 LTR MEDI- (MISCELLANEOUS) ×2 IMPLANT
CANNULA ACUFLEX KIT 5X76 (CANNULA) ×2 IMPLANT
CANNULA DRILOCK 5.0X75 (CANNULA) ×3 IMPLANT
CLOTH BEACON ORANGE TIMEOUT ST (SAFETY) ×2 IMPLANT
CLSR STERI-STRIP ANTIMIC 1/2X4 (GAUZE/BANDAGES/DRESSINGS) ×1 IMPLANT
CONNECTOR 5 IN 1 STRAIGHT STRL (MISCELLANEOUS) ×2 IMPLANT
DRAPE INCISE 23X17 IOBAN STRL (DRAPES) ×1
DRAPE INCISE 23X17 STRL (DRAPES) ×1 IMPLANT
DRAPE INCISE IOBAN 23X17 STRL (DRAPES) ×1 IMPLANT
DRAPE INCISE IOBAN 66X45 STRL (DRAPES) ×2 IMPLANT
DRAPE STERI 35X30 U-POUCH (DRAPES) ×2 IMPLANT
DRAPE SURG 17X11 SM STRL (DRAPES) ×2 IMPLANT
DRAPE U-SHAPE 47X51 STRL (DRAPES) ×2 IMPLANT
DRSG PAD ABDOMINAL 8X10 ST (GAUZE/BANDAGES/DRESSINGS) ×3 IMPLANT
DURAPREP 26ML APPLICATOR (WOUND CARE) ×4 IMPLANT
ELECT REM PT RETURN 9FT ADLT (ELECTROSURGICAL)
ELECTRODE REM PT RTRN 9FT ADLT (ELECTROSURGICAL) ×1 IMPLANT
GLOVE BIO SURGEON STRL SZ7.5 (GLOVE) ×2 IMPLANT
GLOVE BIO SURGEON STRL SZ8 (GLOVE) ×2 IMPLANT
GLOVE EUDERMIC 7 POWDERFREE (GLOVE) ×2 IMPLANT
GLOVE SS BIOGEL STRL SZ 7.5 (GLOVE) ×1 IMPLANT
GLOVE SUPERSENSE BIOGEL SZ 7.5 (GLOVE) ×1
GOWN STRL NON-REIN LRG LVL3 (GOWN DISPOSABLE) ×2 IMPLANT
GOWN STRL REIN XL XLG (GOWN DISPOSABLE) ×8 IMPLANT
KIT BASIN OR (CUSTOM PROCEDURE TRAY) ×2 IMPLANT
KIT ROOM TURNOVER OR (KITS) ×2 IMPLANT
KIT SHOULDER TRACTION (DRAPES) ×2 IMPLANT
MANIFOLD NEPTUNE II (INSTRUMENTS) ×2 IMPLANT
NDL SPNL 18GX3.5 QUINCKE PK (NEEDLE) ×1 IMPLANT
NDL SUT 6 .5 CRC .975X.05 MAYO (NEEDLE) IMPLANT
NEEDLE MAYO TAPER (NEEDLE) ×2
NEEDLE SPNL 18GX3.5 QUINCKE PK (NEEDLE) ×2 IMPLANT
NS IRRIG 1000ML POUR BTL (IV SOLUTION) ×2 IMPLANT
PACK SHOULDER (CUSTOM PROCEDURE TRAY) ×2 IMPLANT
PAD ARMBOARD 7.5X6 YLW CONV (MISCELLANEOUS) ×4 IMPLANT
SET ARTHROSCOPY TUBING (MISCELLANEOUS) ×2
SET ARTHROSCOPY TUBING LN (MISCELLANEOUS) ×1 IMPLANT
SLING ARM FOAM STRAP LRG (SOFTGOODS) ×1 IMPLANT
SLING ARM FOAM STRAP MED (SOFTGOODS) ×1 IMPLANT
SLING SWATHE LARGE (SOFTGOODS) ×1 IMPLANT
SPONGE GAUZE 4X4 12PLY (GAUZE/BANDAGES/DRESSINGS) ×2 IMPLANT
SPONGE LAP 4X18 X RAY DECT (DISPOSABLE) ×2 IMPLANT
STRIP CLOSURE SKIN 1/2X4 (GAUZE/BANDAGES/DRESSINGS) ×2 IMPLANT
SUT MNCRL AB 3-0 PS2 18 (SUTURE) ×2 IMPLANT
SUT PDS AB 0 CT 36 (SUTURE) IMPLANT
SUT RETRIEVER GRASP 30 DEG (SUTURE) ×1 IMPLANT
SYR 20CC LL (SYRINGE) ×2 IMPLANT
TAPE PAPER 2X10 WHT MICROPORE (GAUZE/BANDAGES/DRESSINGS) ×1 IMPLANT
TAPE PAPER 3X10 WHT MICROPORE (GAUZE/BANDAGES/DRESSINGS) ×2 IMPLANT
TOWEL OR 17X24 6PK STRL BLUE (TOWEL DISPOSABLE) ×2 IMPLANT
TOWEL OR 17X26 10 PK STRL BLUE (TOWEL DISPOSABLE) ×2 IMPLANT
WAND SUCTION MAX 4MM 90S (SURGICAL WAND) ×2 IMPLANT
WATER STERILE IRR 1000ML POUR (IV SOLUTION) ×2 IMPLANT

## 2013-04-05 NOTE — Anesthesia Postprocedure Evaluation (Signed)
  Anesthesia Post-op Note  Patient: Jermaine Waters  Procedure(s) Performed: Procedure(s): RIGHT SHOULDER ARTHROSCOPY SUBACROMIAL DECOMPRESSION DISTAL CLAVICLE RESECTION AND ROTATOR CUFF REPAIR  (Right)  Patient Location: PACU  Anesthesia Type:GA combined with regional for post-op pain  Level of Consciousness: awake, alert , oriented and patient cooperative  Airway and Oxygen Therapy: Patient Spontanous Breathing  Post-op Pain: none  Post-op Assessment: Post-op Vital signs reviewed, Patient's Cardiovascular Status Stable, Respiratory Function Stable, Patent Airway, No signs of Nausea or vomiting and Pain level controlled  Post-op Vital Signs: stable  Complications: No apparent anesthesia complications

## 2013-04-05 NOTE — H&P (Signed)
Jermaine Waters    Chief Complaint: RIGHT SHOULDER IMPINGEMENT  HPI: The patient is a 67 y.o. male with chronic right shoulder pain with impingement and MRI evidence of rotator cuff tear  Past Medical History  Diagnosis Date  . Diabetes mellitus   . Depression   . Arthritis   . Cancer     skin CA removed  . Sleep apnea     wears CPAP nightly  . Hypercholesteremia   . Erectile dysfunction     Past Surgical History  Procedure Laterality Date  . Appendectomy    . Cholecystectomy    . Tonsillectomy    . Colonoscopy      Family History  Problem Relation Age of Onset  . Cancer Father     Esophageal/gastric cancer  . Cancer Mother     Metastatic cancer    Social History:  reports that he quit smoking about 33 years ago. His smoking use included Cigarettes. He has a 36 pack-year smoking history. He does not have any smokeless tobacco history on file. He reports that he does not drink alcohol or use illicit drugs.  Allergies: No Known Allergies  Medications Prior to Admission  Medication Sig Dispense Refill  . BD INSULIN SYRINGE ULTRAFINE 31G X 5/16" 0.3 ML MISC       . buPROPion (WELLBUTRIN SR) 100 MG 12 hr tablet Take 100 mg by mouth every other day.      . ezetimibe-simvastatin (VYTORIN) 10-20 MG per tablet Take 1 tablet by mouth daily.       . insulin glargine (LANTUS) 100 UNIT/ML injection Inject 24 Units into the skin at bedtime.        . insulin lispro (HUMALOG) 100 UNIT/ML injection Inject 6-16 Units into the skin 3 (three) times daily before meals. Dose varies per PCP      . methylphenidate (RITALIN) 10 MG tablet Take 10 mg by mouth. 1 tab four times a day      . naproxen sodium (ANAPROX) 220 MG tablet Take 440 mg by mouth daily.      . ramipril (ALTACE) 5 MG tablet Take 5 mg by mouth daily.      . tadalafil (CIALIS) 5 MG tablet Take 5 mg by mouth daily as needed.           Physical Exam: right shoulder with painful and restricted motion as noted at recent office  visit  Vitals  Temp:  [98.3 F (36.8 C)] 98.3 F (36.8 C) (05/01 0915) Pulse Rate:  [69-84] 73 (05/01 1125) Resp:  [8-25] 11 (05/01 1125) BP: (117-158)/(52-79) 124/53 mmHg (05/01 1125) SpO2:  [95 %-100 %] 99 % (05/01 1125)  Assessment/Plan  Impression: RIGHT SHOULDER IMPINGEMENT WITH ROTATOR CUFF TEAR  Plan of Action: Procedure(s): RIGHT SHOULDER ARTHROSCOPY SUBACROMIAL DECOMPRESSION DISTAL CLAVICLE RESECTION AND ROTATOR CUFF REPAIR   Sakura Denis M 04/05/2013, 12:19 PM

## 2013-04-05 NOTE — Preoperative (Signed)
Beta Blockers   Reason not to administer Beta Blockers:Not Applicable 

## 2013-04-05 NOTE — Anesthesia Procedure Notes (Addendum)
Anesthesia Regional Block:  Interscalene brachial plexus block  Pre-Anesthetic Checklist: ,, timeout performed, Correct Patient, Correct Site, Correct Laterality, Correct Procedure, Correct Position, site marked, Risks and benefits discussed,  Surgical consent,  Pre-op evaluation,  At surgeon's request and post-op pain management  Laterality: Right  Prep: chloraprep       Needles:  Injection technique: Single-shot  Needle Type: Echogenic Stimulator Needle     Needle Length: 5cm 5 cm Needle Gauge: 22 and 22 G    Additional Needles:  Procedures: ultrasound guided (picture in chart) and nerve stimulator Interscalene brachial plexus block  Nerve Stimulator or Paresthesia:  Response: biceps flexion, 0.45 mA,   Additional Responses:   Narrative:  Start time: 04/05/2013 11:01 AM End time: 04/05/2013 11:13 AM Injection made incrementally with aspirations every 5 mL.  Performed by: Personally  Anesthesiologist: Dr Chaney Malling  Additional Notes: Functioning IV was confirmed and monitors were applied.  A 50mm 22ga Arrow echogenic stimulator needle was used. Sterile prep and drape,hand hygiene and sterile gloves were used.  Negative aspiration and negative test dose prior to incremental administration of local anesthetic. The patient tolerated the procedure well.  Ultrasound guidance: relevent anatomy identified, needle position confirmed, local anesthetic spread visualized around nerve(s), vascular puncture avoided.  Image printed for medical record.   Interscalene brachial plexus block Procedure Name: Intubation Date/Time: 04/05/2013 12:44 PM Performed by: Sharlene Dory E Pre-anesthesia Checklist: Patient identified, Emergency Drugs available, Suction available, Patient being monitored and Timeout performed Patient Re-evaluated:Patient Re-evaluated prior to inductionOxygen Delivery Method: Circle system utilized Preoxygenation: Pre-oxygenation with 100% oxygen Intubation Type: IV  induction Ventilation: Mask ventilation without difficulty Laryngoscope Size: Mac and 3 Grade View: Grade II Tube type: Oral Tube size: 7.5 mm Number of attempts: 1 Airway Equipment and Method: Stylet Placement Confirmation: ETT inserted through vocal cords under direct vision,  positive ETCO2 and breath sounds checked- equal and bilateral Secured at: 22 cm Tube secured with: Tape Dental Injury: Teeth and Oropharynx as per pre-operative assessment

## 2013-04-05 NOTE — Anesthesia Preprocedure Evaluation (Signed)
Anesthesia Evaluation  Patient identified by MRN, date of birth, ID band Patient awake    Reviewed: Allergy & Precautions, H&P , NPO status , Patient's Chart, lab work & pertinent test results  Airway Mallampati: II  Neck ROM: full    Dental   Pulmonary sleep apnea and Continuous Positive Airway Pressure Ventilation , former smoker,          Cardiovascular     Neuro/Psych Depression    GI/Hepatic   Endo/Other  diabetes, Type 2  Renal/GU      Musculoskeletal  (+) Arthritis -,   Abdominal   Peds  Hematology   Anesthesia Other Findings   Reproductive/Obstetrics                           Anesthesia Physical Anesthesia Plan  ASA: II  Anesthesia Plan: General and Regional   Post-op Pain Management: MAC Combined w/ Regional for Post-op pain   Induction: Intravenous  Airway Management Planned: Oral ETT  Additional Equipment:   Intra-op Plan:   Post-operative Plan: Extubation in OR  Informed Consent: I have reviewed the patients History and Physical, chart, labs and discussed the procedure including the risks, benefits and alternatives for the proposed anesthesia with the patient or authorized representative who has indicated his/her understanding and acceptance.     Plan Discussed with: CRNA, Anesthesiologist and Surgeon  Anesthesia Plan Comments:         Anesthesia Quick Evaluation

## 2013-04-05 NOTE — Op Note (Signed)
04/05/2013  2:25 PM  PATIENT:   Jermaine Waters  67 y.o. male  PRE-OPERATIVE DIAGNOSIS:  RIGHT SHOULDER IMPINGEMENT   POST-OPERATIVE DIAGNOSIS:  Same with AC joint OA and labral tear and rotator cuff tear  PROCEDURE:  RSA, labral debridement, SAD, DCR, RCR  SURGEON:  Tashiana Lamarca, Vania Rea [name]  ASSISTANTS: Shuford pac   ANESTHESIA:   GET + ISB  EBL: min  SPECIMEN:  none  Drains: none   PATIENT DISPOSITION:  PACU - hemodynamically stable.    PLAN OF CARE: Discharge to home after PACU  Dictation# [id number]

## 2013-04-05 NOTE — Transfer of Care (Signed)
Immediate Anesthesia Transfer of Care Note  Patient: Jermaine Waters  Procedure(s) Performed: Procedure(s): RIGHT SHOULDER ARTHROSCOPY SUBACROMIAL DECOMPRESSION DISTAL CLAVICLE RESECTION AND ROTATOR CUFF REPAIR  (Right)  Patient Location: PACU  Anesthesia Type:General  Level of Consciousness: awake, alert  and oriented  Airway & Oxygen Therapy: Patient Spontanous Breathing and Patient connected to nasal cannula oxygen  Post-op Assessment: Report given to PACU RN, Post -op Vital signs reviewed and stable and Patient moving all extremities  Post vital signs: Reviewed and stable  Complications: No apparent anesthesia complications

## 2013-04-06 NOTE — Op Note (Signed)
NAMEMAYANK, TEUSCHER              ACCOUNT NO.:  [id number]  MEDICAL RECORD NO.:  [id number]  LOCATION:  [facility]                         FACILITY:  [facility]  PHYSICIAN:  Vania Rea. Guila Owensby, [name]  DATE OF BIRTH:  [birth year]  DATE OF PROCEDURE:  04/05/2013 DATE OF DISCHARGE:  04/05/2013                              OPERATIVE REPORT   PREOPERATIVE DIAGNOSES: 1. Chronic right shoulder impingement syndrome 2. Right shoulder MRI evidence for rotator cuff tear. 3. Right shoulder acromioclavicular joint arthropathy.  POSTOPERATIVE DIAGNOSES: 1. Chronic right shoulder impingement syndrome. 2. Right shoulder rotator cuff tear. 3. Right shoulder labral tear. 4. Right shoulder acromioclavicular joint arthrosis.  PROCEDURE: 1. Right shoulder examination under anesthesia. 2. Right shoulder diagnostic arthroscopy. 3. Debridement of degenerative labral tear. 4. Arthroscopic subacromial decompression bursectomy. 5. Arthroscopic distal clavicle resection. 6. Arthroscopic rotator cuff repair using a double row suture bridge     repair construct.  SURGEON:  Vania Rea. Tailer Volkert, MD  ASSISTANT:  Lucita Lora. Shuford, PA-C  ANESTHESIA:  General endotracheal as well as an interscalene block.  ESTIMATED BLOOD LOSS:  Minimal.  DRAINS:  None.  HISTORY:  Mr. Spragg is a 67 year old gentleman, who has had chronic and progressive increasing right shoulder pain with functional limitations related to an impingement syndrome with MRI scan evidence for full- thickness rotator cuff tear.  Due to his ongoing pain and functional limitations, he is brought to the operating room at this time for planned right shoulder arthroscopy as described below.  Preoperatively, counseled Mr. Kollmann on treatment options as well as risks versus benefits thereof.  Possible surgical complications were reviewed including potential for bleeding, infection, neurovascular injury, persistent pain, loss of motion, anesthetic  complication, recurrent rotator cuff, possible need for additional surgery.  He understands and accepts and agrees with our planned procedure.  PROCEDURE IN DETAIL:  After undergoing routine preop evaluation, the patient received prophylactic antibiotics and an interscalene block was established in the holding area by the Anesthesia Department.  Placed supine on the operating table, underwent smooth induction of a general endotracheal anesthesia.  Turned to the left lateral decubitus position on a beanbag and appropriately padded and protected.  Right shoulder examination under anesthesia revealed some mild restriction mobility and gentle manipulation was performed with palpable __________ release of adhesions achieving 170 degrees for elevation and full rotation.  Right arm was then suspended at 70 degrees of abduction with 10 pounds of traction.  The right shoulder girdle region was sterilely prepped and draped in standard fashion.  Time-out was called.  The glenohumeral joint and anterior portal was established under direct visualization. The glenohumeral articular surfaces were all in excellent condition. There was extensive degenerative tearing of the superior labrum, which was debrided with shaver back to stable margin.  Biceps tendon was of normal caliber with no proximal or distal instability.  Rotator cuff showed obvious tear involving the distal supraspinatus, approximately 3 cm in width.  No instability patterns were noted.  Moderate synovitis. At this point, fluid and instruments were then removed from the glenohumeral joint and the arm dropped down to 30 degrees of abduction with the arthroscope introduced in the subacromial space of the posterior portal and  a direct lateral portal established in the subacromial space.  Abundant dense bursal tissue and multiple adhesions were encountered and these were all divided and excised with a combination of shaver and Stryker wand.   The wand was then used to remove the periosteum from the undersurface of the anterior half of the acromion.  There was a subacromial space that was markedly stenotic. Bur was introduced, used to perform subacromial decompression, creating a type 1 morphology, nicely decompressing the subacromial space.  Portal was then established directly anterior to the distal clavicle and distal clavicle resection was performed with a bur.  Care was taken to confirm visualization of the entire circumference of the distal clavicle to ensure adequate removal of bone.  We then completed the subacromial/subdeltoid bursectomy.  The rotator cuff tear was readily identified.  The rotator cuff was debrided back to healthy tissue.  The greater tuberosity was prepared removing soft tissue, gently abrading the bone __________ defect approximately 3 cm in width.  Accessory portal device was established and through a stab wound of lateral margin of the acromion, we placed an Arthrex peek corkscrew suture anchor.  The limbs of the suture anchor were then passed equidistant across the width of the rotator cuff tear in a horizontal mattress pattern.  These were then tied with sliding locking knots followed by multiple overhand throws and alternating posts.  We then created "suture bridge" with 2 swivel lock suture anchors.  Compression of the inferior margins, __________ tuberosity and overall construct was much to our satisfaction.  Suture limbs were clipped.  The bursectomy was completed. Hemostasis was obtained.  Fluid and instruments were removed.  Ralene Bathe, PA-C was used as an Geophysicist/field seismologist throughout this case essential for help with positioning extremity, management of the arthroscopic equipment, tissue manipulation, suture management, wound closure and intraoperative decision making.  The portals were closed with Monocryl and Steri-Strips.  Dry dressing taped at the right shoulder.  Right arm was placed in a  sling.  The patient was awakened, extubated, and taken to recovery room in stable condition.     Vania Rea. Tarry Fountain, [name]     KMS/MEDQ  D:  04/05/2013  T:  04/06/2013  Job:  [id number]

## 2013-04-09 ENCOUNTER — Encounter (HOSPITAL_COMMUNITY): Payer: Self-pay | Admitting: Orthopedic Surgery

## 2013-06-28 ENCOUNTER — Encounter: Payer: Self-pay | Admitting: Gastroenterology

## 2013-07-27 ENCOUNTER — Ambulatory Visit (INDEPENDENT_AMBULATORY_CARE_PROVIDER_SITE_OTHER): Payer: BC Managed Care – PPO | Admitting: Ophthalmology

## 2013-08-23 ENCOUNTER — Ambulatory Visit (INDEPENDENT_AMBULATORY_CARE_PROVIDER_SITE_OTHER): Payer: Self-pay | Admitting: Ophthalmology

## 2013-09-12 ENCOUNTER — Ambulatory Visit (INDEPENDENT_AMBULATORY_CARE_PROVIDER_SITE_OTHER): Payer: Federal, State, Local not specified - PPO | Admitting: Ophthalmology

## 2013-09-12 DIAGNOSIS — H43819 Vitreous degeneration, unspecified eye: Secondary | ICD-10-CM

## 2013-09-12 DIAGNOSIS — E1139 Type 2 diabetes mellitus with other diabetic ophthalmic complication: Secondary | ICD-10-CM

## 2013-09-12 DIAGNOSIS — E11359 Type 2 diabetes mellitus with proliferative diabetic retinopathy without macular edema: Secondary | ICD-10-CM

## 2013-09-12 DIAGNOSIS — H2510 Age-related nuclear cataract, unspecified eye: Secondary | ICD-10-CM

## 2013-09-12 DIAGNOSIS — I10 Essential (primary) hypertension: Secondary | ICD-10-CM

## 2013-09-12 DIAGNOSIS — H35039 Hypertensive retinopathy, unspecified eye: Secondary | ICD-10-CM

## 2013-09-12 DIAGNOSIS — H35379 Puckering of macula, unspecified eye: Secondary | ICD-10-CM

## 2013-10-11 ENCOUNTER — Other Ambulatory Visit: Payer: Self-pay

## 2013-10-15 ENCOUNTER — Encounter: Payer: Self-pay | Admitting: Gastroenterology

## 2014-01-14 ENCOUNTER — Encounter: Payer: Self-pay | Admitting: Gastroenterology

## 2014-02-27 ENCOUNTER — Ambulatory Visit (AMBULATORY_SURGERY_CENTER): Payer: Self-pay

## 2014-02-27 VITALS — Ht 65.0 in | Wt 163.8 lb

## 2014-02-27 DIAGNOSIS — Z860100 Personal history of colon polyps, unspecified: Secondary | ICD-10-CM

## 2014-02-27 MED ORDER — SOD PICOSULFATE-MAG OX-CIT ACD 10-3.5-12 MG-GM-GM PO PACK: 1.0000 | PACK | Freq: Once | ORAL | Status: DC

## 2014-03-12 ENCOUNTER — Encounter: Payer: Federal, State, Local not specified - PPO | Admitting: Gastroenterology

## 2014-04-01 ENCOUNTER — Ambulatory Visit (INDEPENDENT_AMBULATORY_CARE_PROVIDER_SITE_OTHER): Payer: Federal, State, Local not specified - PPO | Admitting: Ophthalmology

## 2014-04-16 ENCOUNTER — Ambulatory Visit (INDEPENDENT_AMBULATORY_CARE_PROVIDER_SITE_OTHER): Payer: Federal, State, Local not specified - PPO | Admitting: Ophthalmology

## 2014-04-16 DIAGNOSIS — E1139 Type 2 diabetes mellitus with other diabetic ophthalmic complication: Secondary | ICD-10-CM

## 2014-04-16 DIAGNOSIS — E11359 Type 2 diabetes mellitus with proliferative diabetic retinopathy without macular edema: Secondary | ICD-10-CM

## 2014-04-16 DIAGNOSIS — H43819 Vitreous degeneration, unspecified eye: Secondary | ICD-10-CM

## 2014-04-16 DIAGNOSIS — H35379 Puckering of macula, unspecified eye: Secondary | ICD-10-CM

## 2014-04-16 DIAGNOSIS — H2510 Age-related nuclear cataract, unspecified eye: Secondary | ICD-10-CM

## 2014-04-16 DIAGNOSIS — E1165 Type 2 diabetes mellitus with hyperglycemia: Secondary | ICD-10-CM

## 2014-05-10 ENCOUNTER — Encounter: Payer: Self-pay | Admitting: Gastroenterology

## 2014-05-10 ENCOUNTER — Ambulatory Visit (AMBULATORY_SURGERY_CENTER): Payer: Federal, State, Local not specified - PPO | Admitting: Gastroenterology

## 2014-05-10 VITALS — BP 121/67 | HR 62 | Temp 98.8°F | Resp 21 | Ht 65.5 in | Wt 163.0 lb

## 2014-05-10 DIAGNOSIS — D126 Benign neoplasm of colon, unspecified: Secondary | ICD-10-CM

## 2014-05-10 DIAGNOSIS — Z1211 Encounter for screening for malignant neoplasm of colon: Secondary | ICD-10-CM

## 2014-05-10 LAB — GLUCOSE, CAPILLARY
GLUCOSE-CAPILLARY: 185 mg/dL — AB (ref 70–99)
GLUCOSE-CAPILLARY: 147 mg/dL — AB (ref 70–99)

## 2014-05-10 MED ORDER — SODIUM CHLORIDE 0.9 % IV SOLN: 500.0000 mL | INTRAVENOUS | Status: DC

## 2014-05-10 NOTE — Progress Notes (Signed)
Procedure ends, to recovery, report given and VSS. 

## 2014-05-10 NOTE — Patient Instructions (Signed)
YOU HAD AN ENDOSCOPIC PROCEDURE TODAY AT THE [facility]: Refer to the procedure report that was given to you for any specific questions about what was found during the examination.  If the procedure report does not answer your questions, please call your gastroenterologist to clarify.  If you requested that your care partner not be given the details of your procedure findings, then the procedure report has been included in a sealed envelope for you to review at your convenience later.  YOU SHOULD EXPECT: Some feelings of bloating in the abdomen. Passage of more gas than usual.  Walking can help get rid of the air that was put into your GI tract during the procedure and reduce the bloating. If you had a lower endoscopy (such as a colonoscopy or flexible sigmoidoscopy) you may notice spotting of blood in your stool or on the toilet paper. If you underwent a bowel prep for your procedure, then you may not have a normal bowel movement for a few days.  DIET: Your first meal following the procedure should be a light meal and then it is ok to progress to your normal diet.  A half-sandwich or bowl of soup is an example of a good first meal.  Heavy or fried foods are harder to digest and may make you feel nauseous or bloated.  Likewise meals heavy in dairy and vegetables can cause extra gas to form and this can also increase the bloating.  Drink plenty of fluids but you should avoid alcoholic beverages for 24 hours.  ACTIVITY: Your care partner should take you home directly after the procedure.  You should plan to take it easy, moving slowly for the rest of the day.  You can resume normal activity the day after the procedure however you should NOT DRIVE or use heavy machinery for 24 hours (because of the sedation medicines used during the test).    SYMPTOMS TO REPORT IMMEDIATELY: A gastroenterologist can be reached at any hour.  During normal business hours, 8:30 AM to 5:00 PM Monday through Friday,  call [phone].  After hours and on weekends, please call the GI answering service at [phone] who will take a message and have the physician on call contact you.   Following lower endoscopy (colonoscopy or flexible sigmoidoscopy):  Excessive amounts of blood in the stool  Significant tenderness or worsening of abdominal pains  Swelling of the abdomen that is new, acute  Fever of 100F or higher  FOLLOW UP: If any biopsies were taken you will be contacted by phone or by letter within the next 1-3 weeks.  Call your gastroenterologist if you have not heard about the biopsies in 3 weeks.  Our staff will call the home number listed on your records the next business day following your procedure to check on you and address any questions or concerns that you may have at that time regarding the information given to you following your procedure. This is a courtesy call and so if there is no answer at the home number and we have not heard from you through the emergency physician on call, we will assume that you have returned to your regular daily activities without incident.  SIGNATURES/CONFIDENTIALITY: You and/or your care partner have signed paperwork which will be entered into your electronic medical record.  These signatures attest to the fact that that the information above on your After Visit Summary has been reviewed and is understood.  Full responsibility of the confidentiality of this   discharge information lies with you and/or your care-partner.  Recommendations Next colonoscopy determined by pathology results, 5 or 10 years.

## 2014-05-10 NOTE — Op Note (Signed)
[place]  [address] [place], [id number]   COLONOSCOPY PROCEDURE REPORT  PATIENT: Jermaine Waters, Jermaine Waters  MR#: [id number] BIRTHDATE: [birth year] , 89  yrs. old GENDER: Male ENDOSCOPIST: Ladene Artist, MD, [facility] REFERRED BY: PROCEDURE DATE:  05/10/2014 PROCEDURE:   Colonoscopy with biopsy First Screening Colonoscopy - Avg.  risk and is 50 yrs.  old or older - No.  Prior Negative Screening - Now for repeat screening. 10 or more years since last screening  History of Adenoma - Now for follow-up colonoscopy & has been > or = to 3 yrs.  N/A  Polyps Removed Today? Yes. ASA CLASS:   Class II INDICATIONS:average risk screening. MEDICATIONS: MAC sedation, administered by CRNA and propofol (Diprivan) 230mg  IV DESCRIPTION OF PROCEDURE:   After the risks benefits and alternatives of the procedure were thoroughly explained, informed consent was obtained.  A digital rectal exam revealed no abnormalities of the rectum.   The LB [id number]  endoscope was introduced through the anus and advanced to the cecum, which was identified by both the appendix and ileocecal valve. No adverse events experienced.   The quality of the prep was [place] good The instrument was then slowly withdrawn as the colon was fully examined.  COLON FINDINGS: A sessile polyp measuring 5 mm in size was found in the transverse colon.  A polypectomy was performed with cold forceps.  The resection was complete and the polyp tissue was completely retrieved.   The colon was otherwise normal.  There was no diverticulosis, inflammation, polyps or cancers unless previously stated.  Retroflexed views revealed no abnormalities. The time to cecum=3 minutes 15 seconds.  Withdrawal time=10 minutes 45 seconds.  The scope was withdrawn and the procedure completed. COMPLICATIONS: There were no complications.  ENDOSCOPIC IMPRESSION: 1.   Sessile polyp measuring 5 mm in the transverse colon; polypectomy performed  with cold forceps 2.   The colon was otherwise normal  RECOMMENDATIONS: 1.  Await pathology results 2.  Repeat colonoscopy in 5 years if polyp adenomatous; otherwise 10 years  eSigned:  Ladene Artist, MD, [facility] 05/10/2014 8:27 AM   Tomma Lightning Copy]

## 2014-05-10 NOTE — Progress Notes (Signed)
Called to room to assist during endoscopic procedure.  Patient ID and intended procedure confirmed with present staff. Received instructions for my participation in the procedure from the performing physician.  

## 2014-05-13 ENCOUNTER — Telehealth: Payer: Self-pay | Admitting: *Deleted

## 2014-05-13 NOTE — Telephone Encounter (Signed)
  Follow up Call-  Call back number 05/10/2014  Post procedure Call Back phone  # [phone]  Permission to leave phone message Yes     Patient questions:  Do you have a fever, pain , or abdominal swelling? no Pain Score  0 *  Have you tolerated food without any problems? yes  Have you been able to return to your normal activities? yes  Do you have any questions about your discharge instructions: Diet   no Medications  no Follow up visit  no  Do you have questions or concerns about your Care? no  Actions: * If pain score is 4 or above: No action needed, pain <4.

## 2014-05-15 ENCOUNTER — Encounter: Payer: Self-pay | Admitting: Gastroenterology

## 2014-09-24 ENCOUNTER — Encounter: Payer: Self-pay | Admitting: Internal Medicine

## 2014-09-24 ENCOUNTER — Ambulatory Visit (INDEPENDENT_AMBULATORY_CARE_PROVIDER_SITE_OTHER): Payer: Federal, State, Local not specified - PPO | Admitting: Internal Medicine

## 2014-09-24 VITALS — BP 118/64 | HR 74 | Ht 65.0 in | Wt 167.2 lb

## 2014-09-24 DIAGNOSIS — J342 Deviated nasal septum: Secondary | ICD-10-CM

## 2014-09-24 DIAGNOSIS — G4733 Obstructive sleep apnea (adult) (pediatric): Secondary | ICD-10-CM

## 2014-09-24 NOTE — Assessment & Plan Note (Signed)
He says he's been told in the past by an ENT that he has a deviated septum. He notices difficulty breathing comfortably through his left nostril. I think he is also interested in what surgery could do for his sleep apnea. Plan-ENT referral

## 2014-09-24 NOTE — Assessment & Plan Note (Signed)
He has been compliant with CPAP and has lost a few pounds. He wants to change home care companies. Plan-work with him to change DME company, update CPAP machine if eligible, Autotitrate for pressure and compliance assessment

## 2014-09-24 NOTE — Progress Notes (Signed)
HPI  06/18/11- 64 yoM followed for OSA complicated by DM and arthritis  Last here 06/19/09- note reviewed  Was fully compliant with CPAP 9 at last visit. Now says CPAP not being used regularly. He got tired of not being able to sleep unencumbered. He tried an oral appliance for awhile, then tried just sleeping off flat of back. Wife wants him in the other room- snoring. He notes he stops breathing if he sits quietly. Admits daytime drowsiness.  Aware of deviated septum, but not bothered by it and not considering surgery.  07/19/11- 43 yoM followed for OSA complicated by DM and arthritis  NPSG 07/01/11 with residual apneas. 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 CPAP titration to 7 gave an AHI of 8.1 per hour -report reviewed with him in detail.  Extra time was spent discussing the medical implications and available treatments. We discussed his experience with CPAP masks and oral appliances previously.  09/24/14- 68 yoM former smoker coming to re-establish for obstructive sleep apnea Former patient-on CPAP 7/ through [place] He has continued using CPAP every night. He is not happy with his home care company. He is interested in trying autotitration which he read about. Bedtime between 10 and 11 PM, latency 2 minutes, waking only once or twice before up at 5:45 AM. Has lost 10 pounds in the last 2 years. History tonsillectomy. He cannot breathe easily through his left nostril and has been told he has a deviated septum.  Prior to Admission medications   Medication Sig Start Date End Date Taking? Authorizing Provider  BD INSULIN SYRINGE ULTRAFINE 31G X 5/16" 0.3 ML MISC  06/20/11  Yes Historical Provider, MD  escitalopram (LEXAPRO) 20 MG tablet  04/08/14  Yes Historical Provider, MD  insulin glargine (LANTUS) 100 UNIT/ML injection Inject 24 Units into the skin at bedtime.     Yes Historical Provider, MD  insulin lispro (HUMALOG) 100 UNIT/ML injection Inject 6-16  Units into the skin 3 (three) times daily before meals. Dose varies per PCP   Yes Historical Provider, MD  INVOKANA 100 MG TABS  04/30/14  Yes Historical Provider, MD  methylphenidate (RITALIN) 10 MG tablet Take 10 mg by mouth. 1 tab four times a day   Yes Historical Provider, MD  ONE TOUCH ULTRA TEST test strip  05/07/14  Yes Historical Provider, MD  ramipril (ALTACE) 5 MG tablet Take 5 mg by mouth daily.   Yes Historical Provider, MD  tadalafil (CIALIS) 5 MG tablet Take 5 mg by mouth daily as needed.     Yes Historical Provider, MD   Past Medical History  Diagnosis Date  . Diabetes mellitus   . Depression   . Arthritis   . Cancer     skin CA removed  . Sleep apnea     wears CPAP nightly  . Hypercholesteremia   . Erectile dysfunction    Past Surgical History  Procedure Laterality Date  . Appendectomy    . Cholecystectomy    . Tonsillectomy    . Colonoscopy    . Shoulder arthroscopy with rotator cuff repair and subacromial decompression Right 04/05/2013    Procedure: RIGHT SHOULDER ARTHROSCOPY SUBACROMIAL DECOMPRESSION DISTAL CLAVICLE RESECTION AND ROTATOR CUFF REPAIR ;  Surgeon: Marin Shutter, MD;  Location: [facility];  Service: Orthopedics;  Laterality: Right;   Family History  Problem Relation Age of Onset  . Cancer Father     Esophageal/gastric cancer  . Cancer Mother     Metastatic  cancer  . Colon cancer Neg Hx   . Pancreatic cancer Neg Hx   . Stomach cancer Neg Hx   . Rheum arthritis Mother    History   Social History  . Marital Status: Married    Spouse Name: N/A    Number of Children: 2  . Years of Education: N/A   Occupational History  . Retired [employer]   Social History Main Topics  . Smoking status: Former Smoker -- 2.00 packs/day for 18 years    Types: Cigarettes    Quit date: 12/18/1978  . Smokeless tobacco: Not on file  . Alcohol Use: No     Comment: 1986  . Drug Use: No  . Sexual Activity: Not on file   Other Topics Concern  . Not on file    Social History Narrative  . No narrative on file   ROS-see HPI Constitutional:   No-   weight loss, night sweats, fevers, chills, fatigue, lassitude. HEENT:   No-  headaches, difficulty swallowing, tooth/dental problems, sore throat,       No-  sneezing, itching, ear ache, nasal congestion, post nasal drip,  CV:  No-   chest pain, orthopnea, PND, swelling in lower extremities, anasarca,                                  dizziness, palpitations Resp: No-   shortness of breath with exertion or at rest.              No-   productive cough,  No non-productive cough,  No- coughing up of blood.              No-   change in color of mucus.  No- wheezing.   Skin: No-   rash or lesions. GI:  No-   heartburn, indigestion, abdominal pain, nausea, vomiting, diarrhea,                 change in bowel habits, loss of appetite GU: No-   dysuria, change in color of urine, no urgency or frequency.  No- flank pain. MS:  No-   joint pain or swelling.  No- decreased range of motion.  No- back pain. Neuro-     nothing unusual Psych:  No- change in mood or affect. No depression or anxiety.  No memory loss.  OBJ- Physical Exam General- Alert, Oriented, Affect-appropriate, Distress- none acute, Medium build Skin- rash-none, lesions- none, excoriation- none Lymphadenopathy- none Head- atraumatic            Eyes- Gross vision intact, PERRLA, conjunctivae and secretions clear            Ears- Hearing, canals-normal            Nose- Clear, Septal dev -not obvious anteriorly, no-mucus, polyps, erosion, perforation             Throat- Mallampati II , mucosa clear , drainage- none, tonsils- atrophic Neck- flexible , trachea midline, no stridor , thyroid nl, carotid no bruit Chest - symmetrical excursion , unlabored           Heart/CV- RRR , no murmur , no gallop  , no rub, nl s1 s2                           - JVD- none , edema- none, stasis changes- none, varices- none  Lung- clear to P&A, wheeze- none,  cough- none , dullness-none, rub- none           Chest wall-  Abd- tender-no, distended-no, bowel sounds-present, HSM- no Br/ Gen/ Rectal- Not done, not indicated Extrem- cyanosis- none, clubbing, none, atrophy- none, strength- nl Neuro- grossly intact to observation

## 2014-09-24 NOTE — Patient Instructions (Signed)
Order- referral to ENT [facility]   Dx deviated septum, obstructive sleep apnea  Order- [facility]- Patient would like to change from [place] to another DME.  Dx OSA                  Autotitrate CPAP 5-20 for pressure recommendation, mask of choice, humidifier, supplies

## 2014-10-01 ENCOUNTER — Other Ambulatory Visit: Payer: Self-pay | Admitting: Dermatology

## 2014-10-21 ENCOUNTER — Ambulatory Visit (INDEPENDENT_AMBULATORY_CARE_PROVIDER_SITE_OTHER): Payer: Federal, State, Local not specified - PPO | Admitting: Ophthalmology

## 2014-11-05 DIAGNOSIS — R972 Elevated prostate specific antigen [PSA]: Secondary | ICD-10-CM

## 2014-11-05 HISTORY — DX: Elevated prostate specific antigen (PSA): R97.20

## 2014-11-07 ENCOUNTER — Telehealth: Payer: Self-pay | Admitting: *Deleted

## 2014-11-07 ENCOUNTER — Encounter: Payer: Self-pay | Admitting: Internal Medicine

## 2014-11-07 DIAGNOSIS — G4733 Obstructive sleep apnea (adult) (pediatric): Secondary | ICD-10-CM

## 2014-11-07 NOTE — Telephone Encounter (Signed)
Order- DME [facility]- replacement CPAP machine, autotitrate 5-20 x 7 days for pressure recommendation, mask of choice, humidifier, supplies    Dx OSA

## 2014-11-07 NOTE — Telephone Encounter (Signed)
Order has been placed and nothing further is needed.  

## 2014-11-07 NOTE — Telephone Encounter (Signed)
Per pt email sent 11/07/14:  After our last visit, I spoke with Butch Penny (?) about getting a new CPAP. I mistakenly told her that [facility] was my equipment supplier. In fact, my current machine came through [place]. Please ask her to try to get the history from them, to see if my insurance will cover a new auto-titration machine.  Sorry for sending you down the wrong path.    Thanks,  Jermaine Waters  Please advise PCC's thanks

## 2014-11-07 NOTE — Telephone Encounter (Signed)
Called & spoke with Rodena Piety @[facility] .  She states that it looks like pt would be eligible for a new machine.  I called pt and let him know.  He would like to proceed with an Order being sent to [facility] for him to get a new CPAP.  Will send to Dr. Vassie Loll

## 2014-11-25 ENCOUNTER — Ambulatory Visit (INDEPENDENT_AMBULATORY_CARE_PROVIDER_SITE_OTHER): Payer: Federal, State, Local not specified - PPO | Admitting: Internal Medicine

## 2014-11-25 ENCOUNTER — Encounter: Payer: Self-pay | Admitting: Internal Medicine

## 2014-11-25 VITALS — BP 120/58 | HR 64 | Ht 65.0 in | Wt 166.4 lb

## 2014-11-25 DIAGNOSIS — G4733 Obstructive sleep apnea (adult) (pediatric): Secondary | ICD-10-CM

## 2014-11-25 NOTE — Patient Instructions (Signed)
Order- [facility] change CPAP to auto 8-15 cwp, dx OSA  Please call as needed

## 2014-11-25 NOTE — Progress Notes (Signed)
HPI  06/18/11- 64 yoM followed for OSA complicated by DM and arthritis  Last here 06/19/09- note reviewed  Was fully compliant with CPAP 9 at last visit. Now says CPAP not being used regularly. He got tired of not being able to sleep unencumbered. He tried an oral appliance for awhile, then tried just sleeping off flat of back. Wife wants him in the other room- snoring. He notes he stops breathing if he sits quietly. Admits daytime drowsiness.  Aware of deviated septum, but not bothered by it and not considering surgery.  07/19/11- 37 yoM followed for OSA complicated by DM and arthritis  NPSG 07/01/11 with residual apneas. 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 CPAP titration to 7 gave an AHI of 8.1 per hour -report reviewed with him in detail.  Extra time was spent discussing the medical implications and available treatments. We discussed his experience with CPAP masks and oral appliances previously.  09/24/14- 68 yoM former smoker coming to re-establish for obstructive sleep apnea Former patient-on CPAP 7/ through [place] He has continued using CPAP every night. He is not happy with his home care company. He is interested in trying autotitration which he read about. Bedtime between 10 and 11 PM, latency 2 minutes, waking only once or twice before up at 5:45 AM. Has lost 10 pounds in the last 2 years. History tonsillectomy. He cannot breathe easily through his left nostril and has been told he has a deviated septum.  11/25/14- 68 yoM former smoker followed for obstructive sleep apnea  CPAP 9/ through  Place changed to auto for titration FOLLOWS FOR: Wears CPAP auto 5-20 every night for about 7 hours. DME is [facility]. Pending septoplasty by Dr. Beecher Mcardle HPI Constitutional:   No-   weight loss, night sweats, fevers, chills, fatigue, lassitude. HEENT:   No-  headaches, difficulty swallowing, tooth/dental problems, sore throat,       No-  sneezing,  itching, ear ache, nasal congestion, post nasal drip,  CV:  No-   chest pain, orthopnea, PND, swelling in lower extremities, anasarca,                                  dizziness, palpitations Resp: No-   shortness of breath with exertion or at rest.              No-   productive cough,  No non-productive cough,  No- coughing up of blood.              No-   change in color of mucus.  No- wheezing.   Skin: No-   rash or lesions. GI:  No-   heartburn, indigestion, abdominal pain, nausea, vomiting,  GU:  MS:  No-   joint pain or swelling.   Neuro-     nothing unusual Psych:  No- change in mood or affect. No depression or anxiety.  No memory loss.  OBJ- Physical Exam General- Alert, Oriented, Affect-appropriate, Distress- none acute, Medium build Skin- rash-none, lesions- none, excoriation- none Lymphadenopathy- none Head- atraumatic            Eyes- Gross vision intact, PERRLA, conjunctivae and secretions clear            Ears- Hearing, canals-normal            Nose- Clear, Septal dev -not obvious anteriorly, no-mucus, polyps, erosion, perforation  Throat- Mallampati II-III , mucosa clear , drainage- none, tonsils- atrophic Neck- flexible , trachea midline, no stridor , thyroid nl, carotid no bruit Chest - symmetrical excursion , unlabored           Heart/CV- RRR , no murmur , no gallop  , no rub, nl s1 s2                           - JVD- none , edema- none, stasis changes- none, varices- none           Lung- clear to P&A, wheeze- none, cough- none , dullness-none, rub- none           Chest wall-  Abd-  Br/ Gen/ Rectal- Not done, not indicated Extrem- cyanosis- none, clubbing, none, atrophy- none, strength- nl Neuro- grossly intact to observation

## 2014-11-26 NOTE — Assessment & Plan Note (Signed)
We discussed potential impact of septoplasty surgery.Discussed optional settings for CPAP pressure. Compliance and control has been very good and he is comfortable with CPAP. Plan-Change AutoSet pressure range to 8-15/ [facility]

## 2014-11-28 NOTE — Pre-Procedure Instructions (Signed)
Jermaine Waters  11/28/2014   Your procedure is scheduled on:  Wednesday December 11, 2014 at 9:45 AM.  Report to [facility] Admitting at 7:45 AM.  Call this number if you have problems the morning of surgery: [phone]   For any other questions Monday-Friday from 8am-4pm call: [phone]   Remember:   Do not eat food or drink liquids after midnight.   Take these medicines the morning of surgery with A SIP OF WATER: Lexapro   Do not take any diabetic medication the morning of your surgery   Do not wear jewelry.  Do not wear lotions, powders, or cologne.   Men may shave face and neck.  Do not bring valuables to the hospital.  [facility] is not responsible for any belongings or valuables.               Contacts, dentures or bridgework may not be worn into surgery.  Leave suitcase in the car. After surgery it may be brought to your room.  For patients admitted to the hospital, discharge time is determined by your treatment team.               Patients discharged the day of surgery will not be allowed to drive home.  Name and phone number of your driver:   Special Instructions: Shower using CHG soap the night before and the morning of your surgery   Please read over the following fact sheets that you were given: Pain Booklet, Coughing and Deep Breathing and Surgical Site Infection Prevention

## 2014-12-02 ENCOUNTER — Encounter (HOSPITAL_COMMUNITY): Payer: Self-pay

## 2014-12-02 ENCOUNTER — Encounter (HOSPITAL_COMMUNITY)
Admission: RE | Admit: 2014-12-02 | Discharge: 2014-12-02 | Disposition: A | Discharge status: Home / Self Care | Payer: Federal, State, Local not specified - PPO | Source: Ambulatory Visit | Attending: Otolaryngology | Admitting: Otolaryngology

## 2014-12-02 DIAGNOSIS — E109 Type 1 diabetes mellitus without complications: Secondary | ICD-10-CM | POA: Insufficient documentation

## 2014-12-02 DIAGNOSIS — Z01818 Encounter for other preprocedural examination: Secondary | ICD-10-CM | POA: Insufficient documentation

## 2014-12-02 DIAGNOSIS — Z794 Long term (current) use of insulin: Secondary | ICD-10-CM | POA: Diagnosis not present

## 2014-12-02 HISTORY — DX: Type 2 diabetes mellitus without complications: E11.9

## 2014-12-02 HISTORY — DX: Elevated prostate specific antigen (PSA): R97.20

## 2014-12-02 HISTORY — DX: Unspecified malignant neoplasm of skin of unspecified part of face: C44.300

## 2014-12-02 HISTORY — DX: Anxiety disorder, unspecified: F41.9

## 2014-12-02 LAB — CBC
HEMATOCRIT: 44.1 % (ref 39.0–52.0)
Hemoglobin: 14.8 g/dL (ref 13.0–17.0)
MCH: 30.2 pg (ref 26.0–34.0)
MCHC: 33.6 g/dL (ref 30.0–36.0)
MCV: 90 fL (ref 78.0–100.0)
Platelets: 226 10*3/uL (ref 150–400)
RBC: 4.9 MIL/uL (ref 4.22–5.81)
RDW: 13.1 % (ref 11.5–15.5)
WBC: 4.9 10*3/uL (ref 4.0–10.5)

## 2014-12-02 LAB — BASIC METABOLIC PANEL
Anion gap: 8 (ref 5–15)
BUN: 24 mg/dL — ABNORMAL HIGH (ref 6–23)
CO2: 25 mmol/L (ref 19–32)
Calcium: 8.8 mg/dL (ref 8.4–10.5)
Chloride: 106 mEq/L (ref 96–112)
Creatinine, Ser: 0.87 mg/dL (ref 0.50–1.35)
GFR calc Af Amer: >90 mL/min (ref >90)
GFR calc non Af Amer: 88 mL/min — ABNORMAL LOW (ref >90)
Glucose, Bld: 223 mg/dL — ABNORMAL HIGH (ref 70–99)
Potassium: 4.3 mmol/L (ref 3.5–5.1)
SODIUM: 139 mmol/L (ref 135–145)

## 2014-12-02 NOTE — Progress Notes (Signed)
Patient still had not called Nurse to confirm he received the prior message about patient taking 19 units of Lantus the night before surgery, therefore Nurse called and left another voicemail for patient. Call back number and hours of operation for PAT were also left on voicemail.

## 2014-12-02 NOTE — Progress Notes (Signed)
Nurse called and left a voicemail instructing patient to take only 19 units of Lantus insulin the night before surgery (per Type one diabetic protocol, confirmed by Allison,PA). Direct callback number left for patient to return call to ensure he received voicemail.

## 2014-12-02 NOTE — Progress Notes (Signed)
PCP is [name]. Patient informed Nurse that he had a stress test within the last 5 years, but was unsure as to where or with whom it was performed. Will request records from PCP. Patient is a Type one diabetic and stated his fasting glucose this morning was 156. Patient denied having any cardiac or pulmonary issues.

## 2014-12-03 ENCOUNTER — Encounter: Payer: Self-pay | Admitting: Internal Medicine

## 2014-12-10 NOTE — Anesthesia Preprocedure Evaluation (Addendum)
Anesthesia Evaluation  Patient identified by MRN, date of birth, ID band Patient awake    Reviewed: Allergy & Precautions, NPO status , Patient's Chart, lab work & pertinent test results, reviewed documented beta blocker date and time   Airway Mallampati: II   Neck ROM: Full    Dental  (+) Teeth Intact   Pulmonary sleep apnea , former smoker,          Cardiovascular negative cardio ROS  Rhythm:Regular     Neuro/Psych Anxiety Depression    GI/Hepatic   Endo/Other  diabetes, Poorly Controlled, Insulin Dependent  Renal/GU GFR 90     Musculoskeletal   Abdominal   Peds  Hematology   Anesthesia Other Findings   Reproductive/Obstetrics                            Anesthesia Physical Anesthesia Plan  ASA: II  Anesthesia Plan: General   Post-op Pain Management:    Induction: Intravenous  Airway Management Planned: Oral ETT  Additional Equipment:   Intra-op Plan:   Post-operative Plan: Extubation in OR  Informed Consent: I have reviewed the patients History and Physical, chart, labs and discussed the procedure including the risks, benefits and alternatives for the proposed anesthesia with the patient or authorized representative who has indicated his/her understanding and acceptance.     Plan Discussed with:   Anesthesia Plan Comments:         Anesthesia Quick Evaluation

## 2014-12-11 ENCOUNTER — Ambulatory Visit (HOSPITAL_COMMUNITY): Payer: Federal, State, Local not specified - PPO | Admitting: Certified Registered Nurse Anesthetist

## 2014-12-11 ENCOUNTER — Encounter (HOSPITAL_COMMUNITY)
Admission: RE | Disposition: A | Discharge status: Home / Self Care | Payer: Self-pay | Source: Ambulatory Visit | Attending: Otolaryngology

## 2014-12-11 ENCOUNTER — Ambulatory Visit (HOSPITAL_COMMUNITY)
Admission: RE | Admit: 2014-12-11 | Discharge: 2014-12-11 | Disposition: A | Discharge status: Home / Self Care | Payer: Federal, State, Local not specified - PPO | Source: Ambulatory Visit | Attending: Otolaryngology | Admitting: Otolaryngology

## 2014-12-11 ENCOUNTER — Ambulatory Visit (HOSPITAL_COMMUNITY): Payer: Federal, State, Local not specified - PPO | Admitting: Vascular Surgery

## 2014-12-11 ENCOUNTER — Encounter (HOSPITAL_COMMUNITY): Payer: Self-pay | Admitting: *Deleted

## 2014-12-11 DIAGNOSIS — F329 Major depressive disorder, single episode, unspecified: Secondary | ICD-10-CM | POA: Diagnosis not present

## 2014-12-11 DIAGNOSIS — G4733 Obstructive sleep apnea (adult) (pediatric): Secondary | ICD-10-CM | POA: Diagnosis not present

## 2014-12-11 DIAGNOSIS — E780 Pure hypercholesterolemia: Secondary | ICD-10-CM | POA: Diagnosis not present

## 2014-12-11 DIAGNOSIS — F419 Anxiety disorder, unspecified: Secondary | ICD-10-CM | POA: Insufficient documentation

## 2014-12-11 DIAGNOSIS — E119 Type 2 diabetes mellitus without complications: Secondary | ICD-10-CM | POA: Diagnosis not present

## 2014-12-11 DIAGNOSIS — Z85828 Personal history of other malignant neoplasm of skin: Secondary | ICD-10-CM | POA: Insufficient documentation

## 2014-12-11 DIAGNOSIS — J342 Deviated nasal septum: Secondary | ICD-10-CM | POA: Diagnosis present

## 2014-12-11 DIAGNOSIS — Z79899 Other long term (current) drug therapy: Secondary | ICD-10-CM | POA: Insufficient documentation

## 2014-12-11 DIAGNOSIS — N529 Male erectile dysfunction, unspecified: Secondary | ICD-10-CM | POA: Insufficient documentation

## 2014-12-11 DIAGNOSIS — Z794 Long term (current) use of insulin: Secondary | ICD-10-CM | POA: Diagnosis not present

## 2014-12-11 DIAGNOSIS — M1990 Unspecified osteoarthritis, unspecified site: Secondary | ICD-10-CM | POA: Diagnosis not present

## 2014-12-11 HISTORY — PX: SEPTOPLASTY: SHX2393

## 2014-12-11 HISTORY — PX: TURBINATE REDUCTION: SHX6157

## 2014-12-11 LAB — GLUCOSE, CAPILLARY
GLUCOSE-CAPILLARY: 157 mg/dL — AB (ref 70–99)
Glucose-Capillary: 133 mg/dL — ABNORMAL HIGH (ref 70–99)

## 2014-12-11 SURGERY — SEPTOPLASTY, NOSE
Anesthesia: General | Site: Nose

## 2014-12-11 MED ORDER — 0.9 % SODIUM CHLORIDE (POUR BTL) OPTIME
TOPICAL | Status: DC | PRN
  Administered 2014-12-11: 1000 mL

## 2014-12-11 MED ORDER — DEXAMETHASONE SODIUM PHOSPHATE 10 MG/ML IJ SOLN
INTRAMUSCULAR | Status: DC | PRN
  Administered 2014-12-11: 4 mg via INTRAVENOUS

## 2014-12-11 MED ORDER — PROPOFOL 10 MG/ML IV BOLUS
INTRAVENOUS | Status: DC | PRN
  Administered 2014-12-11: 150 mg via INTRAVENOUS

## 2014-12-11 MED ORDER — MIDAZOLAM HCL 5 MG/5ML IJ SOLN
INTRAMUSCULAR | Status: DC | PRN
  Administered 2014-12-11: 2 mg via INTRAVENOUS

## 2014-12-11 MED ORDER — MUPIROCIN CALCIUM 2 % EX CREA
TOPICAL_CREAM | CUTANEOUS | Status: DC | PRN
  Administered 2014-12-11: 1 via TOPICAL

## 2014-12-11 MED ORDER — AMOXICILLIN-POT CLAVULANATE 500-125 MG PO TABS
1.0000 | ORAL_TABLET | Freq: Two times a day (BID) | ORAL | Status: DC
Start: 2014-12-11 — End: 2015-02-24

## 2014-12-11 MED ORDER — PROMETHAZINE HCL 25 MG/ML IJ SOLN: 6.2500 mg | INTRAMUSCULAR | Status: DC | PRN

## 2014-12-11 MED ORDER — ONDANSETRON HCL 4 MG/2ML IJ SOLN
INTRAMUSCULAR | Status: DC | PRN
  Administered 2014-12-11: 4 mg via INTRAVENOUS

## 2014-12-11 MED ORDER — LIDOCAINE HCL (CARDIAC) 20 MG/ML IV SOLN
INTRAVENOUS | Status: DC | PRN
  Administered 2014-12-11: 100 mg via INTRAVENOUS

## 2014-12-11 MED ORDER — LACTATED RINGERS IV SOLN
INTRAVENOUS | Status: DC | PRN
  Administered 2014-12-11 (×2): via INTRAVENOUS

## 2014-12-11 MED ORDER — PHENYLEPHRINE HCL 10 MG/ML IJ SOLN
INTRAMUSCULAR | Status: DC | PRN
  Administered 2014-12-11: 40 ug via INTRAVENOUS
  Administered 2014-12-11 (×4): 80 ug via INTRAVENOUS

## 2014-12-11 MED ORDER — FENTANYL CITRATE 0.05 MG/ML IJ SOLN
INTRAMUSCULAR | Status: DC | PRN
  Administered 2014-12-11: 100 ug via INTRAVENOUS

## 2014-12-11 MED ORDER — CEFAZOLIN SODIUM-DEXTROSE 2-3 GM-% IV SOLR
INTRAVENOUS | Status: DC | PRN
  Administered 2014-12-11: 2 g via INTRAVENOUS

## 2014-12-11 MED ORDER — GLYCOPYRROLATE 0.2 MG/ML IJ SOLN
INTRAMUSCULAR | Status: DC | PRN
  Administered 2014-12-11: 0.6 mg via INTRAVENOUS

## 2014-12-11 MED ORDER — FENTANYL CITRATE 0.05 MG/ML IJ SOLN: 25.0000 ug | INTRAMUSCULAR | Status: DC | PRN

## 2014-12-11 MED ORDER — LIDOCAINE-EPINEPHRINE 1 %-1:100000 IJ SOLN
INTRAMUSCULAR | Status: AC
  Filled 2014-12-11: qty 1

## 2014-12-11 MED ORDER — OXYMETAZOLINE HCL 0.05 % NA SOLN
NASAL | Status: DC | PRN
  Administered 2014-12-11: 1 via NASAL

## 2014-12-11 MED ORDER — LIDOCAINE-EPINEPHRINE 1 %-1:100000 IJ SOLN
INTRAMUSCULAR | Status: DC | PRN
  Administered 2014-12-11: 20 mL

## 2014-12-11 MED ORDER — MUPIROCIN CALCIUM 2 % EX CREA
TOPICAL_CREAM | CUTANEOUS | Status: AC
  Filled 2014-12-11: qty 15

## 2014-12-11 MED ORDER — NEOSTIGMINE METHYLSULFATE 10 MG/10ML IV SOLN
INTRAVENOUS | Status: DC | PRN
  Administered 2014-12-11: 4 mg via INTRAVENOUS

## 2014-12-11 MED ORDER — MEPERIDINE HCL 25 MG/ML IJ SOLN: 6.2500 mg | INTRAMUSCULAR | Status: DC | PRN

## 2014-12-11 MED ORDER — HYDROCODONE-ACETAMINOPHEN 5-325 MG PO TABS: 1.0000 | ORAL_TABLET | Freq: Four times a day (QID) | ORAL | Status: DC | PRN

## 2014-12-11 MED ORDER — LACTATED RINGERS IV SOLN
INTRAVENOUS | Status: DC
  Administered 2014-12-11: 08:00:00 via INTRAVENOUS

## 2014-12-11 MED ORDER — OXYMETAZOLINE HCL 0.05 % NA SOLN
NASAL | Status: AC
  Filled 2014-12-11: qty 15

## 2014-12-11 MED ORDER — ROCURONIUM BROMIDE 100 MG/10ML IV SOLN
INTRAVENOUS | Status: DC | PRN
  Administered 2014-12-11: 40 mg via INTRAVENOUS

## 2014-12-11 SURGICAL SUPPLY — 26 items
CANISTER SUCTION 2500CC (MISCELLANEOUS) ×4 IMPLANT
COAGULATOR SUCT 6 FR SWTCH (ELECTROSURGICAL)
COAGULATOR SUCT SWTCH 10FR 6 (ELECTROSURGICAL) IMPLANT
ELECT REM PT RETURN 9FT ADLT (ELECTROSURGICAL) ×4
ELECTRODE REM PT RTRN 9FT ADLT (ELECTROSURGICAL) IMPLANT
GAUZE SPONGE 2X2 8PLY STRL LF (GAUZE/BANDAGES/DRESSINGS) ×2 IMPLANT
GLOVE BIOGEL M 7.0 STRL (GLOVE) ×8 IMPLANT
GLOVE BIOGEL PI IND STRL 7.0 (GLOVE) IMPLANT
GLOVE BIOGEL PI INDICATOR 7.0 (GLOVE) ×2
GLOVE SURG SS PI 7.0 STRL IVOR (GLOVE) ×2 IMPLANT
GOWN STRL REUS W/ TWL LRG LVL3 (GOWN DISPOSABLE) ×4 IMPLANT
GOWN STRL REUS W/TWL LRG LVL3 (GOWN DISPOSABLE) ×8
KIT BASIN OR (CUSTOM PROCEDURE TRAY) ×4 IMPLANT
KIT ROOM TURNOVER OR (KITS) ×4 IMPLANT
NS IRRIG 1000ML POUR BTL (IV SOLUTION) ×4 IMPLANT
PAD ARMBOARD 7.5X6 YLW CONV (MISCELLANEOUS) ×8 IMPLANT
SPLINT NASAL DOYLE BI-VL (GAUZE/BANDAGES/DRESSINGS) ×4 IMPLANT
SPONGE GAUZE 2X2 STER 10/PKG (GAUZE/BANDAGES/DRESSINGS) ×2
SPONGE NEURO XRAY DETECT 1X3 (DISPOSABLE) ×4 IMPLANT
SUT ETHILON 3 0 PS 1 (SUTURE) ×4 IMPLANT
SUT PLAIN 4 0 ~~LOC~~ 1 (SUTURE) ×4 IMPLANT
TOWEL OR 17X24 6PK STRL BLUE (TOWEL DISPOSABLE) ×4 IMPLANT
TOWEL OR 17X26 10 PK STRL BLUE (TOWEL DISPOSABLE) ×4 IMPLANT
TRAY ENT MC OR (CUSTOM PROCEDURE TRAY) ×4 IMPLANT
TUBE SALEM SUMP 16 FR W/ARV (TUBING) ×6 IMPLANT
TUBING EXTENTION W/L.L. (IV SETS) ×4 IMPLANT

## 2014-12-11 NOTE — H&P (Signed)
Jermaine Waters is an 69 y.o. male.   Chief Complaint: Nasal airway obstruction HPI: Hx of progressive nasal obstruction, hx of OSA  Past Medical History  Diagnosis Date  . Diabetes mellitus   . Cancer     skin CA removed  . Hypercholesteremia   . Erectile dysfunction   . Diabetes mellitus without complication     Type 1  . Sleep apnea     wears CPAP nightly  . Depression   . Anxiety   . Arthritis   . Skin cancer of face   . Elevated PSA 11/2014    PCP ordered for patient to take Cipro for 21 days    Past Surgical History  Procedure Laterality Date  . Shoulder arthroscopy with rotator cuff repair and subacromial decompression Right 04/05/2013    Procedure: RIGHT SHOULDER ARTHROSCOPY SUBACROMIAL DECOMPRESSION DISTAL CLAVICLE RESECTION AND ROTATOR CUFF REPAIR ;  Surgeon: Marin Shutter, MD;  Location: [facility];  Service: Orthopedics;  Laterality: Right;  . Rotator cuff repair Right   . Appendectomy    . Cholecystectomy    . Tonsillectomy    . Eye surgery Bilateral     laser  . Colonoscopy      Family History  Problem Relation Age of Onset  . Cancer Father     Esophageal/gastric cancer  . Cancer Mother     Metastatic cancer  . Colon cancer Neg Hx   . Pancreatic cancer Neg Hx   . Stomach cancer Neg Hx   . Rheum arthritis Mother    Social History:  reports that he has quit smoking. His smoking use included Cigarettes. He has a 30 pack-year smoking history. He does not have any smokeless tobacco history on file. He reports that he does not drink alcohol or use illicit drugs.  Allergies: No Known Allergies  Medications Prior to Admission  Medication Sig Dispense Refill  . canagliflozin (INVOKANA) 100 MG TABS tablet Take 100 mg by mouth.    . ciprofloxacin (CIPRO) 500 MG tablet Take as directed    . escitalopram (LEXAPRO) 20 MG tablet     . insulin glargine (LANTUS) 100 UNIT/ML injection Inject 24 Units into the skin at bedtime.      . insulin lispro (HUMALOG) 100 UNIT/ML  injection Inject 6-16 Units into the skin 3 (three) times daily before meals. Dose varies per PCP    . methylphenidate (RITALIN) 10 MG tablet 1 tab four times a day    . ramipril (ALTACE) 5 MG capsule Take 5 mg by mouth daily.    . tadalafil (CIALIS) 5 MG tablet Take 5 mg by mouth daily as needed.      . BD INSULIN SYRINGE ULTRAFINE 31G X 5/16" 0.3 ML MISC     . ciprofloxacin (CIPRO) 500 MG tablet Take 500 mg by mouth 2 (two) times daily. Takes for 21 days.    Marland Kitchen escitalopram (LEXAPRO) 20 MG tablet Take 20 mg by mouth daily.    . insulin glargine (LANTUS) 100 UNIT/ML injection Inject 24 Units into the skin at bedtime.    . insulin lispro (HUMALOG) 100 UNIT/ML injection Inject 6-16 Units into the skin 3 (three) times daily before meals. Per sliding scale.    . INVOKANA 100 MG TABS     . methylphenidate (RITALIN) 10 MG tablet Take 10 mg by mouth 4 (four) times daily.    . ONE TOUCH ULTRA TEST test strip     . ramipril (ALTACE) 5 MG tablet Take 5  mg by mouth daily.    . tadalafil (CIALIS) 5 MG tablet Take 5 mg by mouth daily as needed for erectile dysfunction.      Results for orders placed or performed during the hospital encounter of 12/11/14 (from the past 48 hour(s))  Glucose, capillary     Status: Abnormal   Collection Time: 12/11/14  8:24 AM  Result Value Ref Range   Glucose-Capillary 157 (H) 70 - 99 mg/dL   No results found.  Review of Systems  Constitutional: Negative.   HENT: Negative.   Respiratory: Negative.   Cardiovascular: Negative.   Gastrointestinal: Negative.     Blood pressure 132/49, pulse 72, temperature 97.5 F (36.4 C), temperature source Oral, resp. rate 18, height 5\' 5"  (1.651 m), weight 74.844 kg (165 lb), SpO2 99 %. Physical Exam  Constitutional: He is oriented to person, place, and time. He appears well-developed.  HENT:  Deviated nasal septum and IT hypertrophy  Neck: Normal range of motion. Neck supple.  Cardiovascular: Normal rate.   Respiratory:  Effort normal.  GI: Soft.  Musculoskeletal: Normal range of motion.  Neurological: He is alert and oriented to person, place, and time.     Assessment/Plan Adm for Septoplasty and IT Reduction for airway obstruction.  [place], [place]am Caetano 12/11/2014, 9:39 AM

## 2014-12-11 NOTE — Brief Op Note (Signed)
12/11/2014  11:02 AM  PATIENT:  Jermaine Waters  69 y.o. male  PRE-OPERATIVE DIAGNOSIS:  DEVIATED SEPTUM,OBSTUCTIVE SLEEP APNEA  POST-OPERATIVE DIAGNOSIS:  DEVIATED SEPTUM,OBSTUCTIVE SLEEP APNEA  PROCEDURE:  Procedure(s): NASAL BILATERAL SEPTOPLASTY (Bilateral) INFERIOR TURBINATE REDUCTION (N/A)  SURGEON:  Surgeon(s) and Role:    * Jerrell Belfast, MD - Primary  PHYSICIAN ASSISTANT:   ASSISTANTS: none   ANESTHESIA:   general  EBL:   <50cc  BLOOD ADMINISTERED:none  DRAINS: none   LOCAL MEDICATIONS USED:  Amount: 8 ml and NONE  SPECIMEN:  No Specimen  DISPOSITION OF SPECIMEN:  N/A  COUNTS:  YES  TOURNIQUET:  * No tourniquets in log *  DICTATION: .Other Dictation: Dictation Number [id number]  PLAN OF CARE: Discharge to home after PACU  PATIENT DISPOSITION:  PACU - hemodynamically stable.   Delay start of Pharmacological VTE agent (>24hrs) due to surgical blood loss or risk of bleeding: not applicable

## 2014-12-11 NOTE — Transfer of Care (Signed)
Immediate Anesthesia Transfer of Care Note  Patient: Jermaine Waters  Procedure(s) Performed: Procedure(s): NASAL BILATERAL SEPTOPLASTY (Bilateral) INFERIOR TURBINATE REDUCTION (N/A)  Patient Location: PACU    Anesthesia Type:General  Level of Consciousness: awake, alert , oriented and patient cooperative  Airway & Oxygen Therapy: Patient Spontanous Breathing and Patient connected to face mask oxygen  Post-op Assessment: Report given to PACU RN, Post -op Vital signs reviewed and stable, Patient moving all extremities and Patient moving all extremities X 4  Post vital signs: Reviewed and stable  Complications: No apparent anesthesia complications

## 2014-12-11 NOTE — Anesthesia Postprocedure Evaluation (Signed)
  Anesthesia Post-op Note  Patient: Jermaine Waters  Procedure(s) Performed: Procedure(s): NASAL BILATERAL SEPTOPLASTY (Bilateral) INFERIOR TURBINATE REDUCTION (N/A)  Patient Location: PACU  Anesthesia Type:General  Level of Consciousness: awake, alert  and oriented  Airway and Oxygen Therapy: Patient Spontanous Breathing and Patient connected to nasal cannula oxygen  Post-op Pain: mild  Post-op Assessment: Post-op Vital signs reviewed, Patient's Cardiovascular Status Stable, Respiratory Function Stable, Patent Airway and No signs of Nausea or vomiting  Post-op Vital Signs: Reviewed and stable  Last Vitals:  Filed Vitals:   12/11/14 1115  BP:   Pulse: 72  Temp:   Resp: 15    Complications: No apparent anesthesia complications

## 2014-12-11 NOTE — Progress Notes (Signed)
Pt. Reported he has a small york mint this am because his blood sugar was 96 and he felt that it would drop. Dr. Carrie Mew notified. No new orders.

## 2014-12-11 NOTE — Anesthesia Procedure Notes (Signed)
Procedure Name: Intubation Date/Time: 12/11/2014 10:06 AM Performed by: Shirlyn Goltz Pre-anesthesia Checklist: Patient identified, Emergency Drugs available, Suction available and Patient being monitored Patient Re-evaluated:Patient Re-evaluated prior to inductionOxygen Delivery Method: Circle system utilized Preoxygenation: Pre-oxygenation with 100% oxygen Intubation Type: IV induction Ventilation: Mask ventilation without difficulty and Oral airway inserted - appropriate to patient size Laryngoscope size: grade 4 view with miller 3; grade 3 view with mac 4 - tube placed. Grade View: Grade III Tube type: Oral Tube size: 7.5 mm Number of attempts: 1 Airway Equipment and Method: Stylet Placement Confirmation: ETT inserted through vocal cords under direct vision,  positive ETCO2 and breath sounds checked- equal and bilateral Secured at: 21 cm Tube secured with: Tape Dental Injury: Teeth and Oropharynx as per pre-operative assessment

## 2014-12-12 ENCOUNTER — Encounter (HOSPITAL_COMMUNITY): Payer: Self-pay | Admitting: Otolaryngology

## 2014-12-12 NOTE — Op Note (Signed)
Jermaine Waters, GREENOUGH              ACCOUNT NO.:  [id number]  MEDICAL RECORD NO.:  [id number]  LOCATION:  [facility]                         FACILITY:  [facility]  PHYSICIAN:  Early Chars. Wilburn [name], M.D.DATE OF BIRTH:  1946-02-07  DATE OF PROCEDURE:  12/11/2014 DATE OF DISCHARGE:  12/11/2014                              OPERATIVE REPORT   PREOPERATIVE DIAGNOSES: 1. Nasal septal deviation with airway obstruction. 2. Bilateral turbinate hypertrophy. 3. Obstructive sleep apnea.  POSTOPERATIVE DIAGNOSES: 1. Nasal septal deviation with airway obstruction. 2. Bilateral turbinate hypertrophy. 3. Obstructive sleep apnea.  INDICATION FOR SURGERY: 1. Nasal septal deviation with airway obstruction. 2. Bilateral turbinate hypertrophy. 3. Obstructive sleep apnea.  PROCEDURE: 1. Nasal septoplasty. 2. Bilateral inferior turbinate reduction.  ANESTHESIA:  General endotracheal.  SURGEON:  Early Chars. Wilburn [name], MD.  COMPLICATIONS:  None.  BLOOD LOSS:  Less than 50 mL.  The patient transferred from operating room to the recovery room in stable condition.  BRIEF HISTORY:  The patient is a 69 year old white male, who was referred by his pulmonary specialist, Dr. Annamaria Boots for evaluation of nasal airway obstruction.  The patient has been using nasal CPAP with good success but over the last several years as noted increasing symptoms of nasal airway congestion and nasal obstruction.  Examination revealed a severely deviated septum with greater than 75% airway obstruction and bilateral turbinate hypertrophy.  Given the patient's history and findings, I recommended nasal septoplasty and turbinate reduction.  The risks and benefits of procedure were discussed in detail with the patient's wife and they understood and concurred with our plan for surgery which was scheduled at [facility].  DESCRIPTION OF PROCEDURE:  The patient was brought to the operating room on December 11, 2014, and placed  supine position on the operating table. General endotracheal anesthesia was established without difficulty. When the patient was adequately anesthetized, he was positioned on the operating table and prepped and draped.  His nose was injected with a total of 8 mL of 1% lidocaine, 1:100,000 solution epinephrine was injected in a submucosal fashion along the nasal septum and turbinates bilaterally.  The nose was then packed with Afrin soaked cotton pledgets and were placed for approximately 10 minutes to allow for vasoconstriction hemostasis.  When the patient was positioned prepped and draped, surgical procedure begun by creating a left anterior hemitransfixion incision was carried through the mucosa and underlying submucosa and a mucoperichondrial flap was elevated along the left-hand side from anterior to posterior.  The anterior cartilaginous septum was crossed the midline and mucoperichondrial flap elevated on the right mid and anterior septal cartilage was then resected.  This was later morselized and returned to the mucoperichondrial pocket.  The patient had a severely deviated nasal septum with large bony septal spur to the left elevating the overlying mucosa.  The bony and cartilaginous septal deviation was removed with through-cutting forceps and a 4-mm osteotome.  The septum was brought to good midline position.  The septal cartilage was morselized and returned to the mucoperichondrial pocket and the flaps were reapproximated with a 4-0 gut suture on a Keith needle in a horizontal mattress fashion.  The anterior hemitransfixion incision was closed with the same  stitch.  A small blood vessel along the left inferior-anterior nasal septum was then cauterized with monopolar suction cautery.  The nasal septal splints were placed after the application of Bactroban ointment.  They were sutured in position with a 3-0 Ethilon suture.  Inferior turbinate reduction was then performed with  cautery set at 12 watts.  Two submucosal passes were made in each inferior turbinate. With the turbinates were adequately cauterized, anterior incisions were created, overlying soft tissue, elevated small amount of turbinate bone was resected and the turbinate was then outfractured to create a more patent nasal cavity.  The patient's nasopharynx was irrigated and suctioned.  The oral cavity was suctioned.  Orogastric tube passed and the contents aspirated.  The patient was then awakened from his anesthetic.  He was extubated and transferred from the operating room to the recovery room in stable condition.  There were no complications.  Blood loss was less than 50 mL.          ______________________________ Early Chars. Wilburn [name], [name]     DLS/MEDQ  D:  94/58/5929  T:  12/12/2014  Job:  [id number]

## 2015-01-02 ENCOUNTER — Ambulatory Visit (INDEPENDENT_AMBULATORY_CARE_PROVIDER_SITE_OTHER): Payer: Federal, State, Local not specified - PPO | Admitting: Ophthalmology

## 2015-01-02 DIAGNOSIS — H35371 Puckering of macula, right eye: Secondary | ICD-10-CM

## 2015-01-02 DIAGNOSIS — E11351 Type 2 diabetes mellitus with proliferative diabetic retinopathy with macular edema: Secondary | ICD-10-CM

## 2015-01-02 DIAGNOSIS — H43813 Vitreous degeneration, bilateral: Secondary | ICD-10-CM

## 2015-01-02 DIAGNOSIS — E11359 Type 2 diabetes mellitus with proliferative diabetic retinopathy without macular edema: Secondary | ICD-10-CM

## 2015-01-02 DIAGNOSIS — E11311 Type 2 diabetes mellitus with unspecified diabetic retinopathy with macular edema: Secondary | ICD-10-CM

## 2015-01-03 ENCOUNTER — Ambulatory Visit (INDEPENDENT_AMBULATORY_CARE_PROVIDER_SITE_OTHER): Payer: Federal, State, Local not specified - PPO | Admitting: Ophthalmology

## 2015-02-24 ENCOUNTER — Ambulatory Visit (INDEPENDENT_AMBULATORY_CARE_PROVIDER_SITE_OTHER): Payer: Federal, State, Local not specified - PPO | Admitting: Internal Medicine

## 2015-02-24 ENCOUNTER — Encounter: Payer: Self-pay | Admitting: Internal Medicine

## 2015-02-24 DIAGNOSIS — R0602 Shortness of breath: Secondary | ICD-10-CM

## 2015-02-24 NOTE — Patient Instructions (Signed)
We can continue CPAP auto 8-15/ [facility]- office spirometry   Dx tobacco use history

## 2015-02-24 NOTE — Progress Notes (Signed)
HPI  06/18/11- 64 yoM followed for OSA complicated by DM and arthritis  Last here 06/19/09- note reviewed  Was fully compliant with CPAP 9 at last visit. Now says CPAP not being used regularly. He got tired of not being able to sleep unencumbered. He tried an oral appliance for awhile, then tried just sleeping off flat of back. Wife wants him in the other room- snoring. He notes he stops breathing if he sits quietly. Admits daytime drowsiness.  Aware of deviated septum, but not bothered by it and not considering surgery.  07/19/11- 64 yoM followed for OSA complicated by DM and arthritis  NPSG 07/01/11 with residual apneas. 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 CPAP titration to 7 gave an AHI of 8.1 per hour -report reviewed with him in detail.  Extra time was spent discussing the medical implications and available treatments. We discussed his experience with CPAP masks and oral appliances previously.  09/24/14- 68 yoM former smoker coming to re-establish for obstructive sleep apnea Former patient-on CPAP 7/ through [facility] He has continued using CPAP every night. He is not happy with his home care company. He is interested in trying autotitration which he read about. Bedtime between 10 and 11 PM, latency 2 minutes, waking only once or twice before up at 5:45 AM. Has lost 10 pounds in the last 2 years. History tonsillectomy. He cannot breathe easily through his left nostril and has been told he has a deviated septum.  11/25/14- 68 yoM former smoker followed for obstructive sleep apnea  CPAP 9/ through [facility] changed to auto for titration FOLLOWS FOR: Wears CPAP auto 5-20 every night for about 7 hours. DME is [facility]. Pending septoplasty by Dr. Wilburn [name]  02/24/15- 68 yoM former smoker followed for obstructive sleep apnea  CPAP 9/ through [facility] changed to auto for titration FOLLOWS FOR: Wears CPAP auto 8-15 every night; DME is [facility]    ROS-see  HPI Constitutional:   No-   weight loss, night sweats, fevers, chills, fatigue, lassitude. HEENT:   No-  headaches, difficulty swallowing, tooth/dental problems, sore throat,       No-  sneezing, itching, ear ache, nasal congestion, post nasal drip,  CV:  No-   chest pain, orthopnea, PND, swelling in lower extremities, anasarca,                                  dizziness, palpitations Resp: No-   shortness of breath with exertion or at rest.              No-   productive cough,  No non-productive cough,  No- coughing up of blood.              No-   change in color of mucus.  No- wheezing.   Skin: No-   rash or lesions. GI:  No-   heartburn, indigestion, abdominal pain, nausea, vomiting,  GU:  MS:  No-   joint pain or swelling.   Neuro-     nothing unusual Psych:  No- change in mood or affect. No depression or anxiety.  No memory loss.  OBJ- Physical Exam General- Alert, Oriented, Affect-appropriate, Distress- none acute, Medium build Skin- rash-none, lesions- none, excoriation- none Lymphadenopathy- none Head- atraumatic            Eyes- Gross vision intact, PERRLA, conjunctivae and secretions clear  Ears- Hearing, canals-normal            Nose- Clear, Septal dev -not obvious anteriorly, no-mucus, polyps, erosion, perforation             Throat- Mallampati II-III , mucosa clear , drainage- none, tonsils- atrophic Neck- flexible , trachea midline, no stridor , thyroid nl, carotid no bruit Chest - symmetrical excursion , unlabored           Heart/CV- RRR , no murmur , no gallop  , no rub, nl s1 s2                           - JVD- none , edema- none, stasis changes- none, varices- none           Lung- clear to P&A, wheeze- none, cough- none , dullness-none, rub- none           Chest wall-  Abd-  Br/ Gen/ Rectal- Not done, not indicated Extrem- cyanosis- none, clubbing, none, atrophy- none, strength- nl Neuro- grossly intact to observation

## 2015-03-04 NOTE — Progress Notes (Signed)
Quick Note:  Called and spoke to pt. Informed pt of the results per CY. Pt verbalized understanding and denied any further questions or concerns at this time. ______ 

## 2015-06-02 ENCOUNTER — Other Ambulatory Visit: Payer: Self-pay

## 2015-07-03 ENCOUNTER — Ambulatory Visit (INDEPENDENT_AMBULATORY_CARE_PROVIDER_SITE_OTHER): Payer: Federal, State, Local not specified - PPO | Admitting: Ophthalmology

## 2015-07-03 DIAGNOSIS — H2513 Age-related nuclear cataract, bilateral: Secondary | ICD-10-CM

## 2015-07-03 DIAGNOSIS — H43813 Vitreous degeneration, bilateral: Secondary | ICD-10-CM

## 2015-07-03 DIAGNOSIS — E11351 Type 2 diabetes mellitus with proliferative diabetic retinopathy with macular edema: Secondary | ICD-10-CM

## 2015-07-03 DIAGNOSIS — E11319 Type 2 diabetes mellitus with unspecified diabetic retinopathy without macular edema: Secondary | ICD-10-CM

## 2015-07-03 DIAGNOSIS — H35371 Puckering of macula, right eye: Secondary | ICD-10-CM | POA: Diagnosis not present

## 2016-01-01 ENCOUNTER — Ambulatory Visit (INDEPENDENT_AMBULATORY_CARE_PROVIDER_SITE_OTHER): Payer: Federal, State, Local not specified - PPO | Admitting: Ophthalmology

## 2016-01-01 DIAGNOSIS — H43813 Vitreous degeneration, bilateral: Secondary | ICD-10-CM | POA: Diagnosis not present

## 2016-01-01 DIAGNOSIS — H2513 Age-related nuclear cataract, bilateral: Secondary | ICD-10-CM

## 2016-01-01 DIAGNOSIS — H35371 Puckering of macula, right eye: Secondary | ICD-10-CM | POA: Diagnosis not present

## 2016-01-01 DIAGNOSIS — E113593 Type 2 diabetes mellitus with proliferative diabetic retinopathy without macular edema, bilateral: Secondary | ICD-10-CM

## 2016-01-01 DIAGNOSIS — E11319 Type 2 diabetes mellitus with unspecified diabetic retinopathy without macular edema: Secondary | ICD-10-CM | POA: Diagnosis not present

## 2016-02-02 ENCOUNTER — Emergency Department (HOSPITAL_COMMUNITY)
Admission: EM | Admit: 2016-02-02 | Discharge: 2016-02-02 | Discharge status: Absent without leave | Payer: Federal, State, Local not specified - PPO

## 2016-02-24 ENCOUNTER — Ambulatory Visit: Payer: Self-pay | Admitting: Internal Medicine

## 2016-07-02 ENCOUNTER — Ambulatory Visit (INDEPENDENT_AMBULATORY_CARE_PROVIDER_SITE_OTHER): Payer: Federal, State, Local not specified - PPO | Admitting: Ophthalmology

## 2016-08-02 ENCOUNTER — Ambulatory Visit (INDEPENDENT_AMBULATORY_CARE_PROVIDER_SITE_OTHER): Payer: Federal, State, Local not specified - PPO | Admitting: Ophthalmology

## 2016-08-02 DIAGNOSIS — H43813 Vitreous degeneration, bilateral: Secondary | ICD-10-CM | POA: Diagnosis not present

## 2016-08-02 DIAGNOSIS — E11319 Type 2 diabetes mellitus with unspecified diabetic retinopathy without macular edema: Secondary | ICD-10-CM | POA: Diagnosis not present

## 2016-08-02 DIAGNOSIS — H2513 Age-related nuclear cataract, bilateral: Secondary | ICD-10-CM

## 2016-08-02 DIAGNOSIS — E113593 Type 2 diabetes mellitus with proliferative diabetic retinopathy without macular edema, bilateral: Secondary | ICD-10-CM

## 2017-02-07 ENCOUNTER — Ambulatory Visit (INDEPENDENT_AMBULATORY_CARE_PROVIDER_SITE_OTHER): Payer: Federal, State, Local not specified - PPO | Admitting: Ophthalmology

## 2017-02-07 DIAGNOSIS — E11319 Type 2 diabetes mellitus with unspecified diabetic retinopathy without macular edema: Secondary | ICD-10-CM

## 2017-02-07 DIAGNOSIS — H43813 Vitreous degeneration, bilateral: Secondary | ICD-10-CM

## 2017-02-07 DIAGNOSIS — H35371 Puckering of macula, right eye: Secondary | ICD-10-CM

## 2017-02-07 DIAGNOSIS — H2513 Age-related nuclear cataract, bilateral: Secondary | ICD-10-CM | POA: Diagnosis not present

## 2017-02-07 DIAGNOSIS — E113593 Type 2 diabetes mellitus with proliferative diabetic retinopathy without macular edema, bilateral: Secondary | ICD-10-CM | POA: Diagnosis not present

## 2017-06-02 ENCOUNTER — Encounter (INDEPENDENT_AMBULATORY_CARE_PROVIDER_SITE_OTHER): Payer: Federal, State, Local not specified - PPO | Admitting: Ophthalmology

## 2017-06-02 DIAGNOSIS — H35371 Puckering of macula, right eye: Secondary | ICD-10-CM | POA: Diagnosis not present

## 2017-06-02 DIAGNOSIS — H43813 Vitreous degeneration, bilateral: Secondary | ICD-10-CM | POA: Diagnosis not present

## 2017-06-02 DIAGNOSIS — H59032 Cystoid macular edema following cataract surgery, left eye: Secondary | ICD-10-CM

## 2017-06-02 DIAGNOSIS — E11311 Type 2 diabetes mellitus with unspecified diabetic retinopathy with macular edema: Secondary | ICD-10-CM

## 2017-06-02 DIAGNOSIS — E113513 Type 2 diabetes mellitus with proliferative diabetic retinopathy with macular edema, bilateral: Secondary | ICD-10-CM | POA: Diagnosis not present

## 2017-07-11 ENCOUNTER — Encounter (INDEPENDENT_AMBULATORY_CARE_PROVIDER_SITE_OTHER): Payer: Federal, State, Local not specified - PPO | Admitting: Ophthalmology

## 2017-07-11 DIAGNOSIS — E113591 Type 2 diabetes mellitus with proliferative diabetic retinopathy without macular edema, right eye: Secondary | ICD-10-CM | POA: Diagnosis not present

## 2017-07-11 DIAGNOSIS — H59032 Cystoid macular edema following cataract surgery, left eye: Secondary | ICD-10-CM | POA: Diagnosis not present

## 2017-07-11 DIAGNOSIS — H2511 Age-related nuclear cataract, right eye: Secondary | ICD-10-CM | POA: Diagnosis not present

## 2017-07-11 DIAGNOSIS — H43813 Vitreous degeneration, bilateral: Secondary | ICD-10-CM

## 2017-07-11 DIAGNOSIS — E11311 Type 2 diabetes mellitus with unspecified diabetic retinopathy with macular edema: Secondary | ICD-10-CM | POA: Diagnosis not present

## 2017-07-11 DIAGNOSIS — H35033 Hypertensive retinopathy, bilateral: Secondary | ICD-10-CM | POA: Diagnosis not present

## 2017-07-11 DIAGNOSIS — I10 Essential (primary) hypertension: Secondary | ICD-10-CM | POA: Diagnosis not present

## 2017-07-11 DIAGNOSIS — E113512 Type 2 diabetes mellitus with proliferative diabetic retinopathy with macular edema, left eye: Secondary | ICD-10-CM | POA: Diagnosis not present

## 2017-08-15 ENCOUNTER — Ambulatory Visit (INDEPENDENT_AMBULATORY_CARE_PROVIDER_SITE_OTHER): Payer: Federal, State, Local not specified - PPO | Admitting: Ophthalmology

## 2017-08-22 ENCOUNTER — Encounter (INDEPENDENT_AMBULATORY_CARE_PROVIDER_SITE_OTHER): Payer: Federal, State, Local not specified - PPO | Admitting: Ophthalmology

## 2017-08-22 DIAGNOSIS — E113512 Type 2 diabetes mellitus with proliferative diabetic retinopathy with macular edema, left eye: Secondary | ICD-10-CM

## 2017-08-22 DIAGNOSIS — H59032 Cystoid macular edema following cataract surgery, left eye: Secondary | ICD-10-CM | POA: Diagnosis not present

## 2017-08-22 DIAGNOSIS — E113591 Type 2 diabetes mellitus with proliferative diabetic retinopathy without macular edema, right eye: Secondary | ICD-10-CM

## 2017-08-22 DIAGNOSIS — E11311 Type 2 diabetes mellitus with unspecified diabetic retinopathy with macular edema: Secondary | ICD-10-CM | POA: Diagnosis not present

## 2017-08-22 DIAGNOSIS — H2511 Age-related nuclear cataract, right eye: Secondary | ICD-10-CM | POA: Diagnosis not present

## 2017-08-22 DIAGNOSIS — H43813 Vitreous degeneration, bilateral: Secondary | ICD-10-CM | POA: Diagnosis not present

## 2017-10-24 ENCOUNTER — Encounter (INDEPENDENT_AMBULATORY_CARE_PROVIDER_SITE_OTHER): Payer: Federal, State, Local not specified - PPO | Admitting: Ophthalmology

## 2017-10-24 DIAGNOSIS — E113513 Type 2 diabetes mellitus with proliferative diabetic retinopathy with macular edema, bilateral: Secondary | ICD-10-CM

## 2017-10-24 DIAGNOSIS — E11311 Type 2 diabetes mellitus with unspecified diabetic retinopathy with macular edema: Secondary | ICD-10-CM

## 2017-10-24 DIAGNOSIS — H59032 Cystoid macular edema following cataract surgery, left eye: Secondary | ICD-10-CM | POA: Diagnosis not present

## 2017-10-24 DIAGNOSIS — H43813 Vitreous degeneration, bilateral: Secondary | ICD-10-CM | POA: Diagnosis not present

## 2017-12-26 ENCOUNTER — Encounter (INDEPENDENT_AMBULATORY_CARE_PROVIDER_SITE_OTHER): Payer: Federal, State, Local not specified - PPO | Admitting: Ophthalmology

## 2017-12-26 DIAGNOSIS — E11319 Type 2 diabetes mellitus with unspecified diabetic retinopathy without macular edema: Secondary | ICD-10-CM | POA: Diagnosis not present

## 2017-12-26 DIAGNOSIS — H35371 Puckering of macula, right eye: Secondary | ICD-10-CM

## 2017-12-26 DIAGNOSIS — E113593 Type 2 diabetes mellitus with proliferative diabetic retinopathy without macular edema, bilateral: Secondary | ICD-10-CM | POA: Diagnosis not present

## 2017-12-26 DIAGNOSIS — H43813 Vitreous degeneration, bilateral: Secondary | ICD-10-CM

## 2017-12-26 DIAGNOSIS — H59032 Cystoid macular edema following cataract surgery, left eye: Secondary | ICD-10-CM | POA: Diagnosis not present

## 2018-01-03 ENCOUNTER — Encounter: Payer: Self-pay | Admitting: Neurology

## 2018-01-04 ENCOUNTER — Encounter: Payer: Self-pay | Admitting: Neurology

## 2018-01-04 ENCOUNTER — Ambulatory Visit: Payer: Federal, State, Local not specified - PPO | Admitting: Neurology

## 2018-01-04 VITALS — BP 120/54 | HR 68 | Ht 65.0 in | Wt 163.0 lb

## 2018-01-04 DIAGNOSIS — IMO0002 Reserved for concepts with insufficient information to code with codable children: Secondary | ICD-10-CM

## 2018-01-04 DIAGNOSIS — Z9989 Dependence on other enabling machines and devices: Secondary | ICD-10-CM | POA: Diagnosis not present

## 2018-01-04 DIAGNOSIS — G4731 Primary central sleep apnea: Secondary | ICD-10-CM

## 2018-01-04 DIAGNOSIS — E1065 Type 1 diabetes mellitus with hyperglycemia: Secondary | ICD-10-CM | POA: Diagnosis not present

## 2018-01-04 DIAGNOSIS — E1039 Type 1 diabetes mellitus with other diabetic ophthalmic complication: Secondary | ICD-10-CM

## 2018-01-04 DIAGNOSIS — G4733 Obstructive sleep apnea (adult) (pediatric): Secondary | ICD-10-CM

## 2018-01-04 NOTE — Patient Instructions (Signed)

## 2018-01-04 NOTE — Progress Notes (Signed)
SLEEP MEDICINE CLINIC   Provider:  Larey Waters, M D  Primary Care Physician:  Jermaine Waters (Inactive)   Referring Provider: Leanna Battles, MD    Chief Complaint  Patient presents with  . New Patient (Initial Visit)    pt alone, rm 10. pt states that the most recent sleep study was done in 2012. CPAP he has is working well for him. he is here to establish care in order to get supplies.     HPI:  Jermaine Waters is a 72 year old Caucasian, right-handed  male , seen here  in a referral from Dr. Philip Waters for transfer of sleep apnea care.  [name] had several sleep studies in his lifetime, and he seek to originally help because he fell asleep at a stoplight.  He was immediately diagnosed with obstructive sleep apnea once tested and has been on CPAP for at least 2 decades. [name] last sleep study took place in 2012 on July 26 and was interpreted by Dr. Baird Waters.  At the time he had endorsed the Epworth sleepiness score at 18 out of 24 points, his BMI was 28, and he was diagnosed with severe obstructive and central sleep apnea at an AHI of 48.6/hr.   He was also moderately loud snoring oxygen saturation dipped to a nadir of 80% SPO2.  He was titrated to 7 cmH2O on CPAP wearing a Mirage fullface mask with heated humidification, central apneas were emerging and the residual AHI was still 8.1/h of sleep but snoring was eliminated and oxygen saturation improved.  He also had a moderate degree of periodic limb movements 4.4 arousals per hour. [name] has been a highly compliant CPAP user and over the last 5 years may have missed 1 night, but his wife still reports that he has limb movements and seems to run in his sleep.  I was able to obtain a download.  The patient uses an AutoSet between 8 and 15 cmH2O was 2 cm expiratory pressure relief, is 100% compliant, average daily use is 6 hours and 40 minutes, for 1 week in mid January he used a new facemask and only in that week had  significant air leaks.  The 95th percentile pressure for this patient is 14.3 his residual AHI for this months was still 8.2/h most of these apneas according to the CPAP download were obstructive and not central in nature. Obstructive apneas respond to higher pressures, white central apneas are reduced with reduction in pressure.  Also his sleep study placed 6-1/2 years ago, he has a current CPAP machine that is only about 70 years old.  He needs to continue getting supplies for changing or transferring sleep care means that we need to obtain the patient's original sleep test and pretest office notes.  Sleep habits are as follows:  The patient aims for a bedtime between 1030 and 11 PM, and once in bed is promptly asleep.  Bedroom is described as cool, quiet and dark.  He shares the bed and bedroom with his wife, the patient sleeps on his back on one pillow in a non-adjustable bed.He does not have nocturia every night, he does no longer snore since he wears a chin strap now that he was fitted with a nasal pillow- he has a beard. He is not a dream a or at least does not recall dreaming, and reports no sleepwalking episodes of parasomnia. Since retirement he may sleep a little longer than he used to -used to rise at 6  AM, now between 6.30 and 7 AM. After a good night sleep he feels " fully charged and ready to go "-he feels best after at least 7 hours of nocturnal sleep. He does not take naps, as these have left him more groggy and wishing for more sleep rather than being power naps.  Sleep medical history and family sleep history: [name] parents were to his knowledge not by any sleep dysfunction, his siblings neither. There is no history of childhood sleep disorders, enuresis, terrors etc.   The patient's past medical history :  he has been diagnosed with diabetes type 1, control has been at times poorly, has developed diabetic neuropathy, retinopathy, carpal tunnel syndrome, ADD and depression,  hyperlipidemia, and [place] disease.  And atypical chest pain in 2006 was worked up to be of cardiac origin.  Depression has been well controlled on Zoloft, he has undergone cataract surgery left eye Avastin injections and laser treatments of both eyes for diabetic retinopathy. Further surgical treatments include a cholecystectomy and appendectomy in 1980, rotator cuff repair on the right in 2014, septoplasty nasi in 2016, various laser treatments throughout the years 2013 and 2016 as well as cataracts for the left eye 2018 and right eye 2018 about , 6 month apart.   Social history: married , daughter is 65 and son is 3.  The patient smoked for about 15 years until he quit at age 43. No tobacco use since, he quit smoking 5 years before he quit drinking alcohol.  He drinks about 6 cups of coffee a day, no energy drinks, no sodas and no iced tea or hot tea. He has no shift work history.  He likes to sing and bicycles.  Review of Systems: Out of a complete 14 system review, the patient complains of only the following symptoms, and all other reviewed systems are negative.  PLMs but no arousals, no EDS, Not fatigued.  He endorsed snoring, feeling cold, a tendency to bruise easily, dry skin with itching, and some restless legs at night.  Epworth score 10 , Fatigue severity score 20  , depression score na.   Social History   Socioeconomic History  . Marital status: Married    Spouse name: Not on file  . Number of children: 2  . Years of education: Not on file  . Highest education level: Not on file  Social Needs  . Financial resource strain: Not on file  . Food insecurity - worry: Not on file  . Food insecurity - inability: Not on file  . Transportation needs - medical: Not on file  . Transportation needs - non-medical: Not on file  Occupational History  . Occupation: Retired    Fish farm manager: [employer]  Tobacco Use  . Smoking status: Former Smoker    Packs/day: 2.00    Years: 15.00     Pack years: 30.00    Types: Cigarettes    Last attempt to quit: 1981    Years since quitting: 38.1  . Smokeless tobacco: Never Used  Substance and Sexual Activity  . Alcohol use: No    Comment: 1986  . Drug use: No  . Sexual activity: Not on file  Other Topics Concern  . Not on file  Social History Narrative   ** Merged History Encounter **        Family History  Problem Relation Age of Onset  . Cancer Father        Esophageal/gastric cancer  . Cancer Mother  Metastatic cancer  . Rheum arthritis Mother   . Colon cancer Neg Hx   . Pancreatic cancer Neg Hx   . Stomach cancer Neg Hx     Past Medical History:  Diagnosis Date  . Anxiety   . Arthritis   . Cancer ([place])    skin CA removed  . Depression   . Diabetes mellitus   . Diabetes mellitus without complication (HCC)    Type 1  . Elevated PSA 11/2014   PCP ordered for patient to take Cipro for 21 days  . Erectile dysfunction   . Hypercholesteremia   . Skin cancer of face   . Sleep apnea    wears CPAP nightly    Past Surgical History:  Procedure Laterality Date  . APPENDECTOMY    . CHOLECYSTECTOMY    . COLONOSCOPY    . EYE SURGERY Bilateral    laser  . ROTATOR CUFF REPAIR Right   . SEPTOPLASTY Bilateral 12/11/2014   Procedure: NASAL BILATERAL SEPTOPLASTY;  Surgeon: Jerrell Belfast, MD;  Location: [place];  Service: ENT;  Laterality: Bilateral;  . SHOULDER ARTHROSCOPY WITH ROTATOR CUFF REPAIR AND SUBACROMIAL DECOMPRESSION Right 04/05/2013   Procedure: RIGHT SHOULDER ARTHROSCOPY SUBACROMIAL DECOMPRESSION DISTAL CLAVICLE RESECTION AND ROTATOR CUFF REPAIR ;  Surgeon: Marin Shutter, MD;  Location: [place];  Service: Orthopedics;  Laterality: Right;  . TONSILLECTOMY    . TURBINATE REDUCTION N/A 12/11/2014   Procedure: INFERIOR TURBINATE REDUCTION;  Surgeon: Jerrell Belfast, MD;  Location: [place];  Service: ENT;  Laterality: N/A;    Current Outpatient Medications  Medication Sig Dispense Refill  . methylphenidate  (RITALIN) 10 MG tablet Take 10 mg by mouth 4 (four) times daily.    Marland Kitchen NOVOLOG 100 UNIT/ML injection U IN INSULIN PUMP .APPROXIMATELY 80 UNITS A DAY  6  . ramipril (ALTACE) 5 MG capsule Take 5 mg by mouth 2 (two) times daily.     Marland Kitchen SHINGRIX injection   0  . sildenafil (REVATIO) 20 MG tablet Take 20 mg by mouth as needed (take 2-5 tablets as needed for erections).    . tadalafil (CIALIS) 5 MG tablet Take 5 mg by mouth daily as needed for erectile dysfunction.     No current facility-administered medications for this visit.     Allergies as of 01/04/2018  . (No Known Allergies)    Vitals: BP (!) 120/54   Pulse 68   Ht 5\' 5"  (1.651 m)   Wt 163 lb (73.9 kg)   BMI 27.12 kg/m  Last Weight:  Wt Readings from Last 1 Encounters:  01/04/18 163 lb (73.9 kg)   ZOX:WRUE mass index is 27.12 kg/m.     Last Height:   Ht Readings from Last 1 Encounters:  01/04/18 5\' 5"  (1.651 m)    Physical exam:  General: The patient is awake, alert and appears not in acute distress. The patient is well groomed. Facial hair- mustache. Head: Normocephalic, atraumatic. Neck is supple. Mallampati 3- partial uvula lift. ,  neck circumference: 15.5 . Nasal airflow patent , TMJ is  not evident . Retrognathia is not seen.  Cardiovascular:  Regular rate and rhythm , without  murmurs or carotid bruit, and without distended neck veins. Respiratory: Lungs are clear to auscultation. Skin:  Without evidence of edema, or rash Trunk: BMI is 27. The patient's posture is normal.  Neurologic exam : The patient is awake and alert, oriented to place and time.   Attention span & concentration ability appears normal during  this visit.Marland Kitchen  Speech is fluent,  without dysarthria, dysphonia or aphasia.  Mood and affect are appropriate.  Cranial nerves:  intact sense of smell and taste. Pupils are equal and briskly reactive to light. Funduscopic exam with evidence of  Retinal laser scars- Extraocular movements  in vertical and  horizontal planes intact and without nystagmus. Visual fields by finger perimetry are intact. Hearing to finger rub intact. Facial sensation intact to fine touch.Facial motor strength is symmetric and tongue and uvula move midline. Shoulder shrug was symmetrical.   Motor exam:   Normal tone, muscle bulk and symmetric strength in all extremities. Sensory:  Fine touch, pinprick and vibration were tested in all extremities, proximally intact. Proprioception tested in the upper extremities was normal. Coordination: Rapid alternating movements in the fingers/hands was normal. Finger-to-nose maneuver normal without evidence of ataxia, dysmetria or tremor. Gait and station: Patient walks without assistive device .Tandem gait is unfragmented. Turns with 3 Steps. Romberg testing is negative. Deep tendon reflexes: in the  upper and lower extremities are symmetric and intact.    Assessment:  After physical and neurologic examination, review of laboratory studies,  Personal review of imaging studies, reports of other /same  Imaging studies, results of polysomnography and / or neurophysiology testing and pre-existing records as far as provided in visit., my assessment is   1) [name] has carried a diagnosis of obstructive sleep apnea for almost 2 decades, his last sleep study at [facility] in the year 2012 however spoke of a complex sleep apnea with central apneas arising under CPAP treatment.  He has always slept better with CPAP than without, he has made a significant difference in fatigue, excessive daytime sleepiness the quality of sleep overall. Transferring sleep care to our office is certainly possible, his durable medical equipment company is [employer]. I would like for him to continue using [employer] given that he has been a highly compliant patient,  I will order increase in CPAP pressure on his current AutoSet beginning at 7 cmH2O minimum pressure and maximum pressure to be elevated to 17 cmH2O, with 3 cm  EPR. I hope this will reduce the residual obstructive apneas described above.  His compliance is excellent.  I would like to see the patient in 3 months after these changes have been implemented.  Should then there is still be an AHI at/ above 5/hr. we may look at different Masks again and could also discussed using a BiPAP.   The patient was advised of the nature of the diagnosed disorder , the treatment options and the  risks for general health and wellness arising from not treating the condition.   I spent more than 45 minutes of face to face time with the patient.  Greater than 50% of time was spent in counseling and coordination of care. We have discussed the diagnosis and differential and I answered the patient's questions.    Plan:  Treatment plan and additional workup : [employer] order for supplies and rise in pressure on auto set CPAP 7-17 cm water, 3 cm EPR.  Patient would like to try sleeping prone.   Rv in 2-3 month , yearly there after   Jermaine Seat, MD 2/53/6644, 0:34 PM  Certified in Neurology by ABPN Certified in [place] by [facility] [address], [place], [place] [address]

## 2018-02-27 ENCOUNTER — Encounter (INDEPENDENT_AMBULATORY_CARE_PROVIDER_SITE_OTHER): Payer: Federal, State, Local not specified - PPO | Admitting: Ophthalmology

## 2018-02-27 DIAGNOSIS — E113593 Type 2 diabetes mellitus with proliferative diabetic retinopathy without macular edema, bilateral: Secondary | ICD-10-CM | POA: Diagnosis not present

## 2018-02-27 DIAGNOSIS — H59032 Cystoid macular edema following cataract surgery, left eye: Secondary | ICD-10-CM

## 2018-02-27 DIAGNOSIS — E11319 Type 2 diabetes mellitus with unspecified diabetic retinopathy without macular edema: Secondary | ICD-10-CM | POA: Diagnosis not present

## 2018-02-27 DIAGNOSIS — H43813 Vitreous degeneration, bilateral: Secondary | ICD-10-CM | POA: Diagnosis not present

## 2018-02-27 DIAGNOSIS — H35371 Puckering of macula, right eye: Secondary | ICD-10-CM | POA: Diagnosis not present

## 2018-04-10 ENCOUNTER — Telehealth: Payer: Self-pay | Admitting: Nurse Practitioner

## 2018-04-10 NOTE — Telephone Encounter (Signed)
This is a pt of Dr, appt is to look at AHI on new auto settings on 04/12/18  [facility]

## 2018-04-11 ENCOUNTER — Encounter: Payer: Self-pay | Admitting: Nurse Practitioner

## 2018-04-11 NOTE — Progress Notes (Signed)
[facility]  PATIENT: Jermaine Waters DOB: 1946-05-19   REASON FOR VISIT: Follow-up for obstructive sleep apnea with CPAP compliance HISTORY FROM: Patient    HISTORY OF PRESENT ILLNESS:UPDATE 04/12/2018 CM [name], 72 year old male returns for follow-up with history of obstructive sleep apnea here for CPAP compliance.  He is having no problems with CPAP machine he has no problems with mask.  Compliance data dated 03/13/2018-04/11/2018 shows compliance  greater than 4 hours at 90%.  Average usage 6 hours 15 minutes.  Set pressure 7/17 cm.  EPR level 2.  AHI 1.3 he returns for reevaluation  01/04/18 [name] is a 72 year old Caucasian, right-handed  male , seen here  in a referral from Dr. Philip Waters for transfer of sleep apnea care.  Jermaine Waters had several sleep studies in his lifetime, and he seek to originally help because he fell asleep at a stoplight.  He was immediately diagnosed with obstructive sleep apnea once tested and has been on CPAP for at least 2 decades. [name] last sleep study took place in 2012 on July 26 and was interpreted by Dr. Baird Waters.  At the time he had endorsed the Epworth sleepiness score at 18 out of 24 points, his BMI was 28, and he was diagnosed with severe obstructive and central sleep apnea at an AHI of 48.6/hr.   He was also moderately loud snoring oxygen saturation dipped to a nadir of 80% SPO2.  He was titrated to 7 cmH2O on CPAP wearing a Mirage fullface mask with heated humidification, central apneas were emerging and the residual AHI was still 8.1/h of sleep but snoring was eliminated and oxygen saturation improved.  He also had a moderate degree of periodic limb movements 4.4 arousals per hour. [name] has been a highly compliant CPAP user and over the last 5 years may have missed 1 night, but his wife still reports that he has limb movements and seems to run in his sleep.  I was able to obtain a download.  The patient uses an  AutoSet between 8 and 15 cmH2O was 2 cm expiratory pressure relief, is 100% compliant, average daily use is 6 hours and 40 minutes, for 1 week in mid January he used a new facemask and only in that week had significant air leaks.  The 95th percentile pressure for this patient is 14.3 his residual AHI for this months was still 8.2/h most of these apneas according to the CPAP download were obstructive and not central in nature. Obstructive apneas respond to higher pressures, white central apneas are reduced with reduction in pressure.  Also his sleep study placed 6-1/2 years ago, he has a current CPAP machine that is only about 70 years old.  He needs to continue getting supplies for changing or transferring sleep care means that we need to obtain the patient's original sleep test and pretest office notes.  Sleep habits are as follows:  The patient aims for a bedtime between 1030 and 11 PM, and once in bed is promptly asleep.  Bedroom is described as cool, quiet and dark.  He shares the bed and bedroom with his wife, the patient sleeps on his back on one pillow in a non-adjustable bed.He does not have nocturia every night, he does no longer snore since he wears a chin strap now that he was fitted with a nasal pillow- he has a beard. He is not a dream a or at least does not recall dreaming, and reports no sleepwalking  episodes of parasomnia. Since retirement he may sleep a little longer than he used to -used to rise at 6 AM, now between 6.30 and 7 AM. After a good night sleep he feels " fully charged and ready to go "-he feels best after at least 7 hours of nocturnal sleep. He does not take naps, as these have left him more groggy and wishing for more sleep rather than being power naps.   REVIEW OF SYSTEMS: Full 14 system review of systems performed and notable only for those listed, all others are neg:  Constitutional: neg  Cardiovascular: neg Ear/Nose/Throat: neg  Skin: neg Eyes: neg Respiratory:  neg Gastroitestinal: neg  Hematology/Lymphatic: neg  Endocrine: neg Musculoskeletal:neg Allergy/Immunology: neg Neurological: neg Psychiatric: neg Sleep : Obstructive sleep apnea with CPAP   ALLERGIES: No Known Allergies  HOME MEDICATIONS: Outpatient Medications Prior to Visit  Medication Sig Dispense Refill  . methylphenidate (RITALIN) 10 MG tablet Take 10 mg by mouth 4 (four) times daily.    Marland Kitchen NOVOLOG 100 UNIT/ML injection U IN INSULIN PUMP .APPROXIMATELY 80 UNITS A DAY  6  . ramipril (ALTACE) 5 MG capsule Take 5 mg by mouth 2 (two) times daily.     Marland Kitchen SHINGRIX injection   0  . sildenafil (REVATIO) 20 MG tablet Take 20 mg by mouth as needed (take 2-5 tablets as needed for erections).    . tadalafil (CIALIS) 5 MG tablet Take 5 mg by mouth daily as needed for erectile dysfunction.     No facility-administered medications prior to visit.     PAST MEDICAL HISTORY: Past Medical History:  Diagnosis Date  . Anxiety   . Arthritis   . Cancer ([place])    skin CA removed  . Depression   . Diabetes mellitus   . Diabetes mellitus without complication (HCC)    Type 1  . Elevated PSA 11/2014   PCP ordered for patient to take Cipro for 21 days  . Erectile dysfunction   . Hypercholesteremia   . Skin cancer of face   . Sleep apnea    wears CPAP nightly    PAST SURGICAL HISTORY: Past Surgical History:  Procedure Laterality Date  . APPENDECTOMY    . CHOLECYSTECTOMY    . COLONOSCOPY    . EYE SURGERY Bilateral    laser  . ROTATOR CUFF REPAIR Right   . SEPTOPLASTY Bilateral 12/11/2014   Procedure: NASAL BILATERAL SEPTOPLASTY;  Surgeon: Jerrell Belfast, MD;  Location: [place];  Service: ENT;  Laterality: Bilateral;  . SHOULDER ARTHROSCOPY WITH ROTATOR CUFF REPAIR AND SUBACROMIAL DECOMPRESSION Right 04/05/2013   Procedure: RIGHT SHOULDER ARTHROSCOPY SUBACROMIAL DECOMPRESSION DISTAL CLAVICLE RESECTION AND ROTATOR CUFF REPAIR ;  Surgeon: Marin Shutter, MD;  Location: [place];  Service:  Orthopedics;  Laterality: Right;  . TONSILLECTOMY    . TURBINATE REDUCTION N/A 12/11/2014   Procedure: INFERIOR TURBINATE REDUCTION;  Surgeon: Jerrell Belfast, MD;  Location: [facility] OR;  Service: ENT;  Laterality: N/A;    FAMILY HISTORY: Family History  Problem Relation Age of Onset  . Cancer Father        Esophageal/gastric cancer  . Cancer Mother        Metastatic cancer  . Rheum arthritis Mother   . Colon cancer Neg Hx   . Pancreatic cancer Neg Hx   . Stomach cancer Neg Hx     SOCIAL HISTORY: Social History   Socioeconomic History  . Marital status: Married    Spouse name: Not on file  .  Number of children: 2  . Years of education: Not on file  . Highest education level: Not on file  Occupational History  . Occupation: Retired    Fish farm manager: [employer]  Social Needs  . Financial resource strain: Not on file  . Food insecurity:    Worry: Not on file    Inability: Not on file  . Transportation needs:    Medical: Not on file    Non-medical: Not on file  Tobacco Use  . Smoking status: Former Smoker    Packs/day: 2.00    Years: 15.00    Pack years: 30.00    Types: Cigarettes    Last attempt to quit: 1981    Years since quitting: 38.3  . Smokeless tobacco: Never Used  Substance and Sexual Activity  . Alcohol use: No    Comment: 1986  . Drug use: No  . Sexual activity: Not on file  Lifestyle  . Physical activity:    Days per week: Not on file    Minutes per session: Not on file  . Stress: Not on file  Relationships  . Social connections:    Talks on phone: Not on file    Gets together: Not on file    Attends religious service: Not on file    Active member of club or organization: Not on file    Attends meetings of clubs or organizations: Not on file    Relationship status: Not on file  . Intimate partner violence:    Fear of current or ex partner: Not on file    Emotionally abused: Not on file    Physically abused: Not on file    Forced sexual activity:  Not on file  Other Topics Concern  . Not on file  Social History Narrative    Merged History Encounter          PHYSICAL EXAM  Vitals:   04/12/18 1412  BP: (!) 118/45  Pulse: 98  SpO2: 98%  Weight: 160 lb 6.4 oz (72.8 kg)  Height: 5\' 5"  (1.651 m)   Body mass index is 26.69 kg/m.  Generalized: Well developed, in no acute distress  Head: normocephalic and atraumatic,. Oropharynx benign mallopati 3 Neck: Supple, 15.5 circumference Musculoskeletal: No deformity   Neurological examination   Mentation: Alert oriented to time, place, history taking. Attention span and concentration appropriate. Recent and remote memory intact.  Follows all commands speech and language fluent.   Cranial nerve II-XII: Pupils were equal round reactive to light extraocular movements were full, visual field were full on confrontational test. Facial sensation and strength were normal. hearing was intact to finger rubbing bilaterally. Uvula tongue midline. head turning and shoulder shrug were normal and symmetric.Tongue protrusion into cheek strength was normal. Motor: normal bulk and tone, full strength in the BUE, BLE,  Sensory: normal and symmetric to light touch,  Coordination: finger-nose-finger, heel-to-shin bilaterally, no dysmetria Gait and Station: Rising up from seated position without assistance, normal stance,  moderate stride, good arm swing, smooth turning, able to perform tiptoe, and heel walking without difficulty. Tandem gait is steady  DIAGNOSTIC DATA (LABS, IMAGING, TESTING) - I reviewed patient records, labs, notes, testing and imaging myself where available.  Lab Results  Component Value Date   WBC 4.9 12/02/2014   HGB 14.8 12/02/2014   HCT 44.1 12/02/2014   MCV 90.0 12/02/2014   PLT 226 12/02/2014      Component Value Date/Time   NA 139 12/02/2014 0842  K 4.3 12/02/2014 0842   CL 106 12/02/2014 0842   CO2 25 12/02/2014 0842   GLUCOSE 223 (H) 12/02/2014 0842   BUN 24 (H)  12/02/2014 0842   CREATININE 0.87 12/02/2014 0842   CALCIUM 8.8 12/02/2014 0842   GFRNONAA 88 (L) 12/02/2014 0842   GFRAA >90 12/02/2014 0842    ASSESSMENT AND PLAN  72 y.o. year old male  has a past medical history of  Sleep apnea. here to follow-up for CPAP compliance. Data dated 03/13/2018-04/11/2018 shows compliance  greater than 4 hours at 90%.  Average usage 6 hours 15 minutes.  Set pressure 7/17 cm.  EPR level 2.  AHI 1.3      PLAN: CPAP compliance 90% Continue same setting Follow-up in 6 months then yearly Jermaine Waters, [facility], [facility], [facility] [address], [place], [place] [address] [phone]

## 2018-04-12 ENCOUNTER — Ambulatory Visit: Payer: Federal, State, Local not specified - PPO | Admitting: Nurse Practitioner

## 2018-04-12 ENCOUNTER — Encounter: Payer: Self-pay | Admitting: Nurse Practitioner

## 2018-04-12 DIAGNOSIS — G4733 Obstructive sleep apnea (adult) (pediatric): Secondary | ICD-10-CM

## 2018-04-12 DIAGNOSIS — Z9989 Dependence on other enabling machines and devices: Secondary | ICD-10-CM

## 2018-04-12 NOTE — Patient Instructions (Signed)
CPAP compliance 90% Continue same setting Follow-up in 6 months then yearly

## 2018-04-13 ENCOUNTER — Ambulatory Visit: Payer: Federal, State, Local not specified - PPO | Admitting: Neurology

## 2018-04-28 NOTE — Progress Notes (Signed)
I agree with the assessment and plan as directed by NP .The patient is known to me .   Iran Kievit, MD  

## 2018-05-30 ENCOUNTER — Encounter (INDEPENDENT_AMBULATORY_CARE_PROVIDER_SITE_OTHER): Payer: Federal, State, Local not specified - PPO | Admitting: Ophthalmology

## 2018-05-30 DIAGNOSIS — H59032 Cystoid macular edema following cataract surgery, left eye: Secondary | ICD-10-CM

## 2018-05-30 DIAGNOSIS — H43813 Vitreous degeneration, bilateral: Secondary | ICD-10-CM

## 2018-05-30 DIAGNOSIS — E113591 Type 2 diabetes mellitus with proliferative diabetic retinopathy without macular edema, right eye: Secondary | ICD-10-CM

## 2018-05-30 DIAGNOSIS — E11311 Type 2 diabetes mellitus with unspecified diabetic retinopathy with macular edema: Secondary | ICD-10-CM

## 2018-05-30 DIAGNOSIS — E113512 Type 2 diabetes mellitus with proliferative diabetic retinopathy with macular edema, left eye: Secondary | ICD-10-CM | POA: Diagnosis not present

## 2018-07-07 DIAGNOSIS — E10319 Type 1 diabetes mellitus with unspecified diabetic retinopathy without macular edema: Secondary | ICD-10-CM | POA: Diagnosis not present

## 2018-07-07 DIAGNOSIS — I10 Essential (primary) hypertension: Secondary | ICD-10-CM | POA: Diagnosis not present

## 2018-07-07 DIAGNOSIS — Z6827 Body mass index (BMI) 27.0-27.9, adult: Secondary | ICD-10-CM | POA: Diagnosis not present

## 2018-07-07 DIAGNOSIS — R972 Elevated prostate specific antigen [PSA]: Secondary | ICD-10-CM | POA: Diagnosis not present

## 2018-07-07 DIAGNOSIS — F900 Attention-deficit hyperactivity disorder, predominantly inattentive type: Secondary | ICD-10-CM | POA: Diagnosis not present

## 2018-07-07 DIAGNOSIS — E7849 Other hyperlipidemia: Secondary | ICD-10-CM | POA: Diagnosis not present

## 2018-07-07 DIAGNOSIS — G4733 Obstructive sleep apnea (adult) (pediatric): Secondary | ICD-10-CM | POA: Diagnosis not present

## 2018-07-11 ENCOUNTER — Encounter (INDEPENDENT_AMBULATORY_CARE_PROVIDER_SITE_OTHER): Payer: Medicare Other | Admitting: Ophthalmology

## 2018-07-11 DIAGNOSIS — H59032 Cystoid macular edema following cataract surgery, left eye: Secondary | ICD-10-CM

## 2018-07-11 DIAGNOSIS — E113591 Type 2 diabetes mellitus with proliferative diabetic retinopathy without macular edema, right eye: Secondary | ICD-10-CM

## 2018-07-11 DIAGNOSIS — E113512 Type 2 diabetes mellitus with proliferative diabetic retinopathy with macular edema, left eye: Secondary | ICD-10-CM

## 2018-07-11 DIAGNOSIS — H43813 Vitreous degeneration, bilateral: Secondary | ICD-10-CM | POA: Diagnosis not present

## 2018-07-11 DIAGNOSIS — E11311 Type 2 diabetes mellitus with unspecified diabetic retinopathy with macular edema: Secondary | ICD-10-CM

## 2018-07-12 ENCOUNTER — Encounter

## 2018-07-12 ENCOUNTER — Ambulatory Visit: Payer: Federal, State, Local not specified - PPO | Admitting: Neurology

## 2018-08-29 DIAGNOSIS — F419 Anxiety disorder, unspecified: Secondary | ICD-10-CM | POA: Diagnosis not present

## 2018-09-18 ENCOUNTER — Encounter (INDEPENDENT_AMBULATORY_CARE_PROVIDER_SITE_OTHER): Payer: Medicare Other | Admitting: Ophthalmology

## 2018-09-18 DIAGNOSIS — H59032 Cystoid macular edema following cataract surgery, left eye: Secondary | ICD-10-CM | POA: Diagnosis not present

## 2018-09-18 DIAGNOSIS — E113512 Type 2 diabetes mellitus with proliferative diabetic retinopathy with macular edema, left eye: Secondary | ICD-10-CM | POA: Diagnosis not present

## 2018-09-18 DIAGNOSIS — E113591 Type 2 diabetes mellitus with proliferative diabetic retinopathy without macular edema, right eye: Secondary | ICD-10-CM | POA: Diagnosis not present

## 2018-09-18 DIAGNOSIS — E11311 Type 2 diabetes mellitus with unspecified diabetic retinopathy with macular edema: Secondary | ICD-10-CM | POA: Diagnosis not present

## 2018-09-18 DIAGNOSIS — H26491 Other secondary cataract, right eye: Secondary | ICD-10-CM

## 2018-09-18 DIAGNOSIS — H43813 Vitreous degeneration, bilateral: Secondary | ICD-10-CM | POA: Diagnosis not present

## 2018-09-25 ENCOUNTER — Encounter (INDEPENDENT_AMBULATORY_CARE_PROVIDER_SITE_OTHER): Payer: Federal, State, Local not specified - PPO | Admitting: Ophthalmology

## 2018-09-25 DIAGNOSIS — H2701 Aphakia, right eye: Secondary | ICD-10-CM

## 2018-10-12 ENCOUNTER — Ambulatory Visit (INDEPENDENT_AMBULATORY_CARE_PROVIDER_SITE_OTHER): Payer: Medicare Other

## 2018-10-12 ENCOUNTER — Ambulatory Visit (INDEPENDENT_AMBULATORY_CARE_PROVIDER_SITE_OTHER): Payer: Medicare Other | Admitting: Podiatry

## 2018-10-12 ENCOUNTER — Other Ambulatory Visit: Payer: Self-pay | Admitting: Podiatry

## 2018-10-12 VITALS — BP 156/66 | HR 74

## 2018-10-12 DIAGNOSIS — B351 Tinea unguium: Secondary | ICD-10-CM

## 2018-10-12 DIAGNOSIS — E119 Type 2 diabetes mellitus without complications: Secondary | ICD-10-CM

## 2018-10-12 DIAGNOSIS — S90121A Contusion of right lesser toe(s) without damage to nail, initial encounter: Secondary | ICD-10-CM

## 2018-10-12 DIAGNOSIS — M79675 Pain in left toe(s): Secondary | ICD-10-CM

## 2018-10-12 DIAGNOSIS — S99921A Unspecified injury of right foot, initial encounter: Secondary | ICD-10-CM | POA: Diagnosis not present

## 2018-10-12 DIAGNOSIS — I998 Other disorder of circulatory system: Secondary | ICD-10-CM

## 2018-10-12 DIAGNOSIS — E139 Other specified diabetes mellitus without complications: Secondary | ICD-10-CM | POA: Diagnosis not present

## 2018-10-12 DIAGNOSIS — L97511 Non-pressure chronic ulcer of other part of right foot limited to breakdown of skin: Principal | ICD-10-CM

## 2018-10-12 DIAGNOSIS — E08621 Diabetes mellitus due to underlying condition with foot ulcer: Secondary | ICD-10-CM

## 2018-10-12 NOTE — Progress Notes (Signed)
Subjective:  Patient ID: Jermaine Waters, male    DOB: 1946-07-08,  MRN: [id number]  Chief Complaint  Patient presents with  . Diabetes    diabetic foot exam  . Toe Injury    jammed 5th right toe into a rolled up carpet 2 weeks ago - still hurts    72 y.o. male presents  for diabetic foot care. Last AMBS was unknown. Reports pain to the right 5th toe as above.  Denies numbness and tingling in their feet. Denies cramping in legs and thighs.  Review of Systems: Negative except as noted in the HPI. Denies N/V/F/Ch.  Past Medical History:  Diagnosis Date  . Anxiety   . Arthritis   . Cancer ([place])    skin CA removed  . Depression   . Diabetes mellitus   . Diabetes mellitus without complication (HCC)    Type 1  . Elevated PSA 11/2014   PCP ordered for patient to take Cipro for 21 days  . Erectile dysfunction   . Hypercholesteremia   . Skin cancer of face   . Sleep apnea    wears CPAP nightly    Current Outpatient Medications:  .  amLODipine (NORVASC) 2.5 MG tablet, TK 1 T PO QD, Disp: , Rfl: 12 .  methylphenidate (RITALIN) 10 MG tablet, Take 10 mg by mouth 4 (four) times daily., Disp: , Rfl:  .  NOVOLOG 100 UNIT/ML injection, U IN INSULIN PUMP .APPROXIMATELY 80 UNITS A DAY, Disp: , Rfl: 6 .  PROLENSA 0.07 % SOLN, INT 1 GTT IN OS HS, Disp: , Rfl: 6 .  ramipril (ALTACE) 5 MG capsule, Take 5 mg by mouth 2 (two) times daily. , Disp: , Rfl:  .  SHINGRIX injection, , Disp: , Rfl: 0 .  sildenafil (REVATIO) 20 MG tablet, Take 20 mg by mouth as needed (take 2-5 tablets as needed for erections)., Disp: , Rfl:   Social History   Tobacco Use  Smoking Status Former Smoker  . Packs/day: 2.00  . Years: 15.00  . Pack years: 30.00  . Types: Cigarettes  . Last attempt to quit: 1981  . Years since quitting: 38.8  Smokeless Tobacco Never Used    No Known Allergies Objective:   Vitals:   10/12/18 1013  BP: (!) 156/66  Pulse: 74   There is no height or weight on file to calculate  BMI. Constitutional Well developed. Well nourished.  Vascular Dorsalis pedis pulses present 1+ bilaterally  Posterior tibial pulses present 1+ bilaterally  Pedal hair growth absent. Capillary refill normal to all digits.  No cyanosis or clubbing noted.  Neurologic Normal speech. Oriented to person, place, and time. Epicritic sensation to light touch grossly present bilaterally. Protective sensation with 5.07 monofilament  present bilaterally. Vibratory sensation present bilaterally.  Dermatologic Nails elongated, thickened, dystrophic. No open wounds. No skin lesions.  Orthopedic: Normal joint ROM without pain or crepitus bilaterally. No visible deformities. POP R 5th toe   Assessment:   1. Contusion of fifth toe of right foot, initial encounter   2. Pain in toe of left foot   3. Encounter for diabetic foot exam ([place])   4. Secondary DM without complications ([place])   5. Onychomycosis   6. Vascular calcification    Plan:  Patient was evaluated and treated and all questions answered.  Diabetes without Complication -Educated on diabetic footcare. Diabetic risk level 0 -Discussed vascular calcification visible on x-ray  Onychomycosis -Educated on use of tea tree oil  Contusion Right 5th  Toe -XR reviewed no evidence of fracture.  Return in about 1 year (around 10/13/2019) for Annual DM foot exam.

## 2018-10-12 NOTE — Patient Instructions (Signed)

## 2018-10-13 NOTE — Progress Notes (Signed)
[facility]  PATIENT: Jermaine Waters DOB: [birth year]   REASON FOR VISIT: Follow-up for obstructive sleep apnea with CPAP compliance HISTORY FROM: Patient    HISTORY OF PRESENT ILLNESS:UPDATE 11/18/2019CM Jermaine Waters, 72 year old male returns for follow-up here for obstructive sleep apnea with CPAP compliance He is having no problems with his machine.  Compliance data dated 09/22/2018-10/21/2018 shows compliance greater than 4 hours at 100%.  Average usage 6 hours 53 minutes.  Set pressure 7 to 17 cm.  EPR level 2 AHI 1.  He returns for reevaluation  UPDATE 04/12/2018 CM Jermaine Waters, 72 year old male returns for follow-up with history of obstructive sleep apnea here for CPAP compliance.  He is having no problems with CPAP machine he has no problems with mask.  Compliance data dated 03/13/2018-04/11/2018 shows compliance  greater than 4 hours at 90%.  Average usage 6 hours 15 minutes.  Set pressure 7/17 cm.  EPR level 2.  AHI 1.3 he returns for reevaluation  01/04/18 [name] is a 72 year old Caucasian, right-handed  male , seen here  in a referral from Dr. Philip Aspen for transfer of sleep apnea care.  Jermaine Waters had several sleep studies in his lifetime, and he seek to originally help because he fell asleep at a stoplight.  He was immediately diagnosed with obstructive sleep apnea once tested and has been on CPAP for at least 2 decades. Jermaine Waters last sleep study took place in 2012 on July 26 and was interpreted by Dr. Baird Lyons.  At the time he had endorsed the Epworth sleepiness score at 18 out of 24 points, his BMI was 28, and he was diagnosed with severe obstructive and central sleep apnea at an AHI of 48.6/hr.   He was also moderately loud snoring oxygen saturation dipped to a nadir of 80% SPO2.  He was titrated to 7 cmH2O on CPAP wearing a Mirage fullface mask with heated humidification, central apneas were emerging and the residual AHI was still 8.1/h of sleep but  snoring was eliminated and oxygen saturation improved.  He also had a moderate degree of periodic limb movements 4.4 arousals per hour. Jermaine Waters has been a highly compliant CPAP user and over the last 5 years may have missed 1 night, but his wife still reports that he has limb movements and seems to run in his sleep.  I was able to obtain a download.  The patient uses an AutoSet between 8 and 15 cmH2O was 2 cm expiratory pressure relief, is 100% compliant, average daily use is 6 hours and 40 minutes, for 1 week in mid January he used a new facemask and only in that week had significant air leaks.  The 95th percentile pressure for this patient is 14.3 his residual AHI for this months was still 8.2/h most of these apneas according to the CPAP download were obstructive and not central in nature. Obstructive apneas respond to higher pressures, white central apneas are reduced with reduction in pressure.  Also his sleep study placed 6-1/2 years ago, he has a current CPAP machine that is only about 70 years old.  He needs to continue getting supplies for changing or transferring sleep care means that we need to obtain the patient's original sleep test and pretest office notes.  Sleep habits are as follows:  The patient aims for a bedtime between 1030 and 11 PM, and once in bed is promptly asleep.  Bedroom is described as cool, quiet and dark.  He shares the bed and  bedroom with his wife, the patient sleeps on his back on one pillow in a non-adjustable bed.He does not have nocturia every night, he does no longer snore since he wears a chin strap now that he was fitted with a nasal pillow- he has a beard. He is not a dream a or at least does not recall dreaming, and reports no sleepwalking episodes of parasomnia. Since retirement he may sleep a little longer than he used to -used to rise at 6 AM, now between 6.30 and 7 AM. After a good night sleep he feels " fully charged and ready to go "-he feels best after  at least 7 hours of nocturnal sleep. He does not take naps, as these have left him more groggy and wishing for more sleep rather than being power naps.   REVIEW OF SYSTEMS: Full 14 system review of systems performed and notable only for those listed, all others are neg:  Constitutional: neg  Cardiovascular: neg Ear/Nose/Throat: neg  Skin: neg Eyes: neg Respiratory: neg Gastroitestinal: neg  Hematology/Lymphatic: Easy bruising Endocrine: neg Musculoskeletal:neg Allergy/Immunology: neg Neurological: neg Psychiatric: neg Sleep : Obstructive sleep apnea with CPAP   ALLERGIES: No Known Allergies  HOME MEDICATIONS: Outpatient Medications Prior to Visit  Medication Sig Dispense Refill  . methylphenidate (RITALIN) 10 MG tablet Take 10 mg by mouth 4 (four) times daily.    Marland Kitchen NOVOLOG 100 UNIT/ML injection U IN INSULIN PUMP .APPROXIMATELY 80 UNITS A DAY  6  . PROLENSA 0.07 % SOLN INT 1 GTT IN OS HS  6  . ramipril (ALTACE) 5 MG capsule Take 5 mg by mouth 2 (two) times daily.     Marland Kitchen SHINGRIX injection   0  . sildenafil (REVATIO) 20 MG tablet Take 20 mg by mouth as needed (take 2-5 tablets as needed for erections).    Marland Kitchen amLODipine (NORVASC) 2.5 MG tablet TK 1 T PO QD  12   No facility-administered medications prior to visit.     PAST MEDICAL HISTORY: Past Medical History:  Diagnosis Date  . Anxiety   . Arthritis   . Cancer ([place])    skin CA removed  . Depression   . Diabetes mellitus   . Diabetes mellitus without complication (HCC)    Type 1  . Elevated PSA 11/2014   PCP ordered for patient to take Cipro for 21 days  . Erectile dysfunction   . Hypercholesteremia   . Skin cancer of face   . Sleep apnea    wears CPAP nightly    PAST SURGICAL HISTORY: Past Surgical History:  Procedure Laterality Date  . APPENDECTOMY    . CHOLECYSTECTOMY    . COLONOSCOPY    . EYE SURGERY Bilateral    laser  . ROTATOR CUFF REPAIR Right   . SEPTOPLASTY Bilateral 12/11/2014   Procedure:  NASAL BILATERAL SEPTOPLASTY;  Surgeon: Jerrell Belfast, MD;  Location: [facility];  Service: ENT;  Laterality: Bilateral;  . SHOULDER ARTHROSCOPY WITH ROTATOR CUFF REPAIR AND SUBACROMIAL DECOMPRESSION Right 04/05/2013   Procedure: RIGHT SHOULDER ARTHROSCOPY SUBACROMIAL DECOMPRESSION DISTAL CLAVICLE RESECTION AND ROTATOR CUFF REPAIR ;  Surgeon: Marin Shutter, MD;  Location: [facility];  Service: Orthopedics;  Laterality: Right;  . TONSILLECTOMY    . TURBINATE REDUCTION N/A 12/11/2014   Procedure: INFERIOR TURBINATE REDUCTION;  Surgeon: Jerrell Belfast, MD;  Location: [facility] OR;  Service: ENT;  Laterality: N/A;    FAMILY HISTORY: Family History  Problem Relation Age of Onset  . Cancer Father  Esophageal/gastric cancer  . Cancer Mother        Metastatic cancer  . Rheum arthritis Mother   . Colon cancer Neg Hx   . Pancreatic cancer Neg Hx   . Stomach cancer Neg Hx     SOCIAL HISTORY: Social History   Socioeconomic History  . Marital status: Married    Spouse name: Not on file  . Number of children: 2  . Years of education: Not on file  . Highest education level: Not on file  Occupational History  . Occupation: Retired    Fish farm manager: [employer]  Social Needs  . Financial resource strain: Not on file  . Food insecurity:    Worry: Not on file    Inability: Not on file  . Transportation needs:    Medical: Not on file    Non-medical: Not on file  Tobacco Use  . Smoking status: Former Smoker    Packs/day: 2.00    Years: 15.00    Pack years: 30.00    Types: Cigarettes    Last attempt to quit: 1981    Years since quitting: 38.9  . Smokeless tobacco: Never Used  Substance and Sexual Activity  . Alcohol use: No    Comment: 1986  . Drug use: No  . Sexual activity: Not on file  Lifestyle  . Physical activity:    Days per week: Not on file    Minutes per session: Not on file  . Stress: Not on file  Relationships  . Social connections:    Talks on phone: Not on file    Gets  together: Not on file    Attends religious service: Not on file    Active member of club or organization: Not on file    Attends meetings of clubs or organizations: Not on file    Relationship status: Not on file  . Intimate partner violence:    Fear of current or ex partner: Not on file    Emotionally abused: Not on file    Physically abused: Not on file    Forced sexual activity: Not on file  Other Topics Concern  . Not on file  Social History Narrative    Merged History Encounter          PHYSICAL EXAM  Vitals:   10/23/18 1502  BP: (!) 119/53  Pulse: 72  Weight: 164 lb 9.6 oz (74.7 kg)  Height: 5\' 5"  (1.651 m)   Body mass index is 27.39 kg/m.  Generalized: Well developed, in no acute distress  Head: normocephalic and atraumatic,. Oropharynx benign mallopati 3 Neck: Supple, 15.5 circumference Lungs clear Musculoskeletal: No deformity  Skin no rash mild bruising noted on hands Neurological examination   Mentation: Alert oriented to time, place, history taking. Attention span and concentration appropriate. Recent and remote memory intact.  Follows all commands speech and language fluent.   Cranial nerve II-XII: Pupils were equal round reactive to light extraocular movements were full, visual field were full on confrontational test. Facial sensation and strength were normal. hearing was intact to finger rubbing bilaterally. Uvula tongue midline. head turning and shoulder shrug were normal and symmetric.Tongue protrusion into cheek strength was normal. Motor: normal bulk and tone, full strength in the BUE, BLE,  Sensory: normal and symmetric to light touch,  Coordination: finger-nose-finger, heel-to-shin bilaterally, no dysmetria Gait and Station: Rising up from seated position without assistance, normal stance,  moderate stride, good arm swing, smooth turning, able to perform tiptoe, and heel  walking without difficulty. Tandem gait is steady  DIAGNOSTIC DATA (LABS,  IMAGING, TESTING) - I reviewed patient records, labs, notes, testing and imaging myself where available.  Lab Results  Component Value Date   WBC 4.9 12/02/2014   HGB 14.8 12/02/2014   HCT 44.1 12/02/2014   MCV 90.0 12/02/2014   PLT 226 12/02/2014      Component Value Date/Time   NA 139 12/02/2014 0842   K 4.3 12/02/2014 0842   CL 106 12/02/2014 0842   CO2 25 12/02/2014 0842   GLUCOSE 223 (H) 12/02/2014 0842   BUN 24 (H) 12/02/2014 0842   CREATININE 0.87 12/02/2014 0842   CALCIUM 8.8 12/02/2014 0842   GFRNONAA 88 (L) 12/02/2014 0842   GFRAA >90 12/02/2014 0842    ASSESSMENT AND PLAN  72 y.o. year old male  has a past medical history of  Sleep apnea. here to follow-up for CPAP compliance. Data dated 09/22/2018-10/21/2018 shows compliance greater than 4 hours at 100%.  Average usage 6 hours 53 minutes.  Set pressure 7 to 17 cm.  EPR level 2 AHI 1.    PLAN: CPAP compliance 100% Continue same setting Follow-up  Yearly Equipment company is [place], [facility], [facility], APRN  [facility] [address] [place], [place] [address] [phone]

## 2018-10-21 ENCOUNTER — Encounter: Payer: Self-pay | Admitting: Neurology

## 2018-10-23 ENCOUNTER — Encounter: Payer: Self-pay | Admitting: Nurse Practitioner

## 2018-10-23 ENCOUNTER — Ambulatory Visit (INDEPENDENT_AMBULATORY_CARE_PROVIDER_SITE_OTHER): Payer: Medicare Other | Admitting: Nurse Practitioner

## 2018-10-23 VITALS — BP 119/53 | HR 72 | Ht 65.0 in | Wt 164.6 lb

## 2018-10-23 DIAGNOSIS — Z9989 Dependence on other enabling machines and devices: Secondary | ICD-10-CM

## 2018-10-23 DIAGNOSIS — G4733 Obstructive sleep apnea (adult) (pediatric): Secondary | ICD-10-CM | POA: Diagnosis not present

## 2018-10-23 NOTE — Patient Instructions (Signed)
CPAP compliance 100% Continue same setting Follow-up  yearly

## 2018-10-25 DIAGNOSIS — Z794 Long term (current) use of insulin: Secondary | ICD-10-CM | POA: Diagnosis not present

## 2018-10-25 DIAGNOSIS — I10 Essential (primary) hypertension: Secondary | ICD-10-CM | POA: Diagnosis not present

## 2018-10-25 DIAGNOSIS — E10319 Type 1 diabetes mellitus with unspecified diabetic retinopathy without macular edema: Secondary | ICD-10-CM | POA: Diagnosis not present

## 2018-10-25 DIAGNOSIS — Z6827 Body mass index (BMI) 27.0-27.9, adult: Secondary | ICD-10-CM | POA: Diagnosis not present

## 2018-10-25 DIAGNOSIS — Z4681 Encounter for fitting and adjustment of insulin pump: Secondary | ICD-10-CM | POA: Diagnosis not present

## 2018-10-26 ENCOUNTER — Encounter (INDEPENDENT_AMBULATORY_CARE_PROVIDER_SITE_OTHER): Payer: Medicare Other | Admitting: Ophthalmology

## 2018-10-26 DIAGNOSIS — H43813 Vitreous degeneration, bilateral: Secondary | ICD-10-CM | POA: Diagnosis not present

## 2018-10-26 DIAGNOSIS — E113591 Type 2 diabetes mellitus with proliferative diabetic retinopathy without macular edema, right eye: Secondary | ICD-10-CM | POA: Diagnosis not present

## 2018-10-26 DIAGNOSIS — E11311 Type 2 diabetes mellitus with unspecified diabetic retinopathy with macular edema: Secondary | ICD-10-CM

## 2018-10-26 DIAGNOSIS — E113512 Type 2 diabetes mellitus with proliferative diabetic retinopathy with macular edema, left eye: Secondary | ICD-10-CM | POA: Diagnosis not present

## 2018-11-06 DIAGNOSIS — E10319 Type 1 diabetes mellitus with unspecified diabetic retinopathy without macular edema: Secondary | ICD-10-CM | POA: Diagnosis not present

## 2018-11-06 DIAGNOSIS — I10 Essential (primary) hypertension: Secondary | ICD-10-CM | POA: Diagnosis not present

## 2018-11-06 DIAGNOSIS — E7849 Other hyperlipidemia: Secondary | ICD-10-CM | POA: Diagnosis not present

## 2018-11-07 DIAGNOSIS — Z23 Encounter for immunization: Secondary | ICD-10-CM | POA: Diagnosis not present

## 2018-11-23 DIAGNOSIS — Z85828 Personal history of other malignant neoplasm of skin: Secondary | ICD-10-CM | POA: Diagnosis not present

## 2018-11-23 DIAGNOSIS — L570 Actinic keratosis: Secondary | ICD-10-CM | POA: Diagnosis not present

## 2018-11-23 DIAGNOSIS — D225 Melanocytic nevi of trunk: Secondary | ICD-10-CM | POA: Diagnosis not present

## 2018-11-23 DIAGNOSIS — L821 Other seborrheic keratosis: Secondary | ICD-10-CM | POA: Diagnosis not present

## 2018-11-23 DIAGNOSIS — L812 Freckles: Secondary | ICD-10-CM | POA: Diagnosis not present

## 2018-11-27 ENCOUNTER — Encounter (INDEPENDENT_AMBULATORY_CARE_PROVIDER_SITE_OTHER): Payer: Medicare Other | Admitting: Ophthalmology

## 2018-11-27 DIAGNOSIS — E11311 Type 2 diabetes mellitus with unspecified diabetic retinopathy with macular edema: Secondary | ICD-10-CM

## 2018-11-27 DIAGNOSIS — E113591 Type 2 diabetes mellitus with proliferative diabetic retinopathy without macular edema, right eye: Secondary | ICD-10-CM

## 2018-11-27 DIAGNOSIS — H34812 Central retinal vein occlusion, left eye, with macular edema: Secondary | ICD-10-CM

## 2018-11-27 DIAGNOSIS — E113512 Type 2 diabetes mellitus with proliferative diabetic retinopathy with macular edema, left eye: Secondary | ICD-10-CM

## 2018-11-27 DIAGNOSIS — H43813 Vitreous degeneration, bilateral: Secondary | ICD-10-CM | POA: Diagnosis not present

## 2018-11-30 DIAGNOSIS — M1712 Unilateral primary osteoarthritis, left knee: Secondary | ICD-10-CM | POA: Insufficient documentation

## 2018-12-04 DIAGNOSIS — M1712 Unilateral primary osteoarthritis, left knee: Secondary | ICD-10-CM | POA: Diagnosis not present

## 2019-01-03 ENCOUNTER — Encounter (INDEPENDENT_AMBULATORY_CARE_PROVIDER_SITE_OTHER): Payer: Medicare Other | Admitting: Ophthalmology

## 2019-01-03 DIAGNOSIS — E113512 Type 2 diabetes mellitus with proliferative diabetic retinopathy with macular edema, left eye: Secondary | ICD-10-CM

## 2019-01-03 DIAGNOSIS — E113591 Type 2 diabetes mellitus with proliferative diabetic retinopathy without macular edema, right eye: Secondary | ICD-10-CM

## 2019-01-03 DIAGNOSIS — H34812 Central retinal vein occlusion, left eye, with macular edema: Secondary | ICD-10-CM | POA: Diagnosis not present

## 2019-01-03 DIAGNOSIS — H43813 Vitreous degeneration, bilateral: Secondary | ICD-10-CM | POA: Diagnosis not present

## 2019-01-03 DIAGNOSIS — H35371 Puckering of macula, right eye: Secondary | ICD-10-CM

## 2019-01-03 DIAGNOSIS — E11311 Type 2 diabetes mellitus with unspecified diabetic retinopathy with macular edema: Secondary | ICD-10-CM

## 2019-01-23 DIAGNOSIS — Z4681 Encounter for fitting and adjustment of insulin pump: Secondary | ICD-10-CM | POA: Diagnosis not present

## 2019-01-23 DIAGNOSIS — E10319 Type 1 diabetes mellitus with unspecified diabetic retinopathy without macular edema: Secondary | ICD-10-CM | POA: Diagnosis not present

## 2019-01-23 DIAGNOSIS — Z794 Long term (current) use of insulin: Secondary | ICD-10-CM | POA: Diagnosis not present

## 2019-01-23 DIAGNOSIS — F0390 Unspecified dementia without behavioral disturbance: Secondary | ICD-10-CM | POA: Diagnosis not present

## 2019-01-23 DIAGNOSIS — I10 Essential (primary) hypertension: Secondary | ICD-10-CM | POA: Diagnosis not present

## 2019-02-07 DIAGNOSIS — E663 Overweight: Secondary | ICD-10-CM | POA: Diagnosis not present

## 2019-02-07 DIAGNOSIS — M2550 Pain in unspecified joint: Secondary | ICD-10-CM | POA: Diagnosis not present

## 2019-02-07 DIAGNOSIS — Z6828 Body mass index (BMI) 28.0-28.9, adult: Secondary | ICD-10-CM | POA: Diagnosis not present

## 2019-02-07 DIAGNOSIS — M150 Primary generalized (osteo)arthritis: Secondary | ICD-10-CM | POA: Diagnosis not present

## 2019-02-07 DIAGNOSIS — M79641 Pain in right hand: Secondary | ICD-10-CM | POA: Diagnosis not present

## 2019-02-14 ENCOUNTER — Other Ambulatory Visit: Payer: Self-pay

## 2019-02-14 ENCOUNTER — Encounter (INDEPENDENT_AMBULATORY_CARE_PROVIDER_SITE_OTHER): Payer: Medicare Other | Admitting: Ophthalmology

## 2019-02-14 DIAGNOSIS — E113512 Type 2 diabetes mellitus with proliferative diabetic retinopathy with macular edema, left eye: Secondary | ICD-10-CM

## 2019-02-14 DIAGNOSIS — H34812 Central retinal vein occlusion, left eye, with macular edema: Secondary | ICD-10-CM

## 2019-02-14 DIAGNOSIS — E11311 Type 2 diabetes mellitus with unspecified diabetic retinopathy with macular edema: Secondary | ICD-10-CM | POA: Diagnosis not present

## 2019-02-14 DIAGNOSIS — E113591 Type 2 diabetes mellitus with proliferative diabetic retinopathy without macular edema, right eye: Secondary | ICD-10-CM | POA: Diagnosis not present

## 2019-02-14 DIAGNOSIS — H43813 Vitreous degeneration, bilateral: Secondary | ICD-10-CM

## 2019-04-04 ENCOUNTER — Other Ambulatory Visit: Payer: Self-pay

## 2019-04-04 ENCOUNTER — Encounter (INDEPENDENT_AMBULATORY_CARE_PROVIDER_SITE_OTHER): Payer: Medicare Other | Admitting: Ophthalmology

## 2019-04-04 DIAGNOSIS — H43813 Vitreous degeneration, bilateral: Secondary | ICD-10-CM

## 2019-04-04 DIAGNOSIS — E11311 Type 2 diabetes mellitus with unspecified diabetic retinopathy with macular edema: Secondary | ICD-10-CM | POA: Diagnosis not present

## 2019-04-04 DIAGNOSIS — E113513 Type 2 diabetes mellitus with proliferative diabetic retinopathy with macular edema, bilateral: Secondary | ICD-10-CM

## 2019-04-04 DIAGNOSIS — H34812 Central retinal vein occlusion, left eye, with macular edema: Secondary | ICD-10-CM | POA: Diagnosis not present

## 2019-04-24 DIAGNOSIS — Z4681 Encounter for fitting and adjustment of insulin pump: Secondary | ICD-10-CM | POA: Diagnosis not present

## 2019-04-24 DIAGNOSIS — E10319 Type 1 diabetes mellitus with unspecified diabetic retinopathy without macular edema: Secondary | ICD-10-CM | POA: Diagnosis not present

## 2019-04-24 DIAGNOSIS — I10 Essential (primary) hypertension: Secondary | ICD-10-CM | POA: Diagnosis not present

## 2019-04-24 DIAGNOSIS — Z794 Long term (current) use of insulin: Secondary | ICD-10-CM | POA: Diagnosis not present

## 2019-05-23 ENCOUNTER — Other Ambulatory Visit: Payer: Self-pay

## 2019-05-23 ENCOUNTER — Encounter (INDEPENDENT_AMBULATORY_CARE_PROVIDER_SITE_OTHER): Payer: Medicare Other | Admitting: Ophthalmology

## 2019-05-23 DIAGNOSIS — E113512 Type 2 diabetes mellitus with proliferative diabetic retinopathy with macular edema, left eye: Secondary | ICD-10-CM | POA: Diagnosis not present

## 2019-05-23 DIAGNOSIS — H43813 Vitreous degeneration, bilateral: Secondary | ICD-10-CM | POA: Diagnosis not present

## 2019-05-23 DIAGNOSIS — E11311 Type 2 diabetes mellitus with unspecified diabetic retinopathy with macular edema: Secondary | ICD-10-CM | POA: Diagnosis not present

## 2019-05-23 DIAGNOSIS — E113591 Type 2 diabetes mellitus with proliferative diabetic retinopathy without macular edema, right eye: Secondary | ICD-10-CM

## 2019-05-23 DIAGNOSIS — H34812 Central retinal vein occlusion, left eye, with macular edema: Secondary | ICD-10-CM

## 2019-05-24 DIAGNOSIS — L570 Actinic keratosis: Secondary | ICD-10-CM | POA: Diagnosis not present

## 2019-05-24 DIAGNOSIS — Z85828 Personal history of other malignant neoplasm of skin: Secondary | ICD-10-CM | POA: Diagnosis not present

## 2019-05-24 DIAGNOSIS — L821 Other seborrheic keratosis: Secondary | ICD-10-CM | POA: Diagnosis not present

## 2019-05-24 DIAGNOSIS — L812 Freckles: Secondary | ICD-10-CM | POA: Diagnosis not present

## 2019-05-24 DIAGNOSIS — L218 Other seborrheic dermatitis: Secondary | ICD-10-CM | POA: Diagnosis not present

## 2019-05-24 DIAGNOSIS — D225 Melanocytic nevi of trunk: Secondary | ICD-10-CM | POA: Diagnosis not present

## 2019-07-02 DIAGNOSIS — M25562 Pain in left knee: Secondary | ICD-10-CM | POA: Diagnosis not present

## 2019-07-13 DIAGNOSIS — G4733 Obstructive sleep apnea (adult) (pediatric): Secondary | ICD-10-CM | POA: Diagnosis not present

## 2019-07-13 DIAGNOSIS — F329 Major depressive disorder, single episode, unspecified: Secondary | ICD-10-CM | POA: Diagnosis not present

## 2019-07-13 DIAGNOSIS — Z1331 Encounter for screening for depression: Secondary | ICD-10-CM | POA: Diagnosis not present

## 2019-07-13 DIAGNOSIS — I10 Essential (primary) hypertension: Secondary | ICD-10-CM | POA: Diagnosis not present

## 2019-07-13 DIAGNOSIS — E10319 Type 1 diabetes mellitus with unspecified diabetic retinopathy without macular edema: Secondary | ICD-10-CM | POA: Diagnosis not present

## 2019-07-13 DIAGNOSIS — Z794 Long term (current) use of insulin: Secondary | ICD-10-CM | POA: Diagnosis not present

## 2019-07-16 DIAGNOSIS — M25562 Pain in left knee: Secondary | ICD-10-CM | POA: Diagnosis not present

## 2019-07-16 DIAGNOSIS — R269 Unspecified abnormalities of gait and mobility: Secondary | ICD-10-CM | POA: Diagnosis not present

## 2019-07-18 ENCOUNTER — Encounter (INDEPENDENT_AMBULATORY_CARE_PROVIDER_SITE_OTHER): Payer: Medicare Other | Admitting: Ophthalmology

## 2019-07-18 ENCOUNTER — Other Ambulatory Visit: Payer: Self-pay

## 2019-07-18 DIAGNOSIS — E113512 Type 2 diabetes mellitus with proliferative diabetic retinopathy with macular edema, left eye: Secondary | ICD-10-CM

## 2019-07-18 DIAGNOSIS — H43813 Vitreous degeneration, bilateral: Secondary | ICD-10-CM | POA: Diagnosis not present

## 2019-07-18 DIAGNOSIS — E11311 Type 2 diabetes mellitus with unspecified diabetic retinopathy with macular edema: Secondary | ICD-10-CM | POA: Diagnosis not present

## 2019-07-18 DIAGNOSIS — E113591 Type 2 diabetes mellitus with proliferative diabetic retinopathy without macular edema, right eye: Secondary | ICD-10-CM | POA: Diagnosis not present

## 2019-07-18 DIAGNOSIS — H34812 Central retinal vein occlusion, left eye, with macular edema: Secondary | ICD-10-CM

## 2019-07-24 DIAGNOSIS — E10319 Type 1 diabetes mellitus with unspecified diabetic retinopathy without macular edema: Secondary | ICD-10-CM | POA: Diagnosis not present

## 2019-07-24 DIAGNOSIS — Z4681 Encounter for fitting and adjustment of insulin pump: Secondary | ICD-10-CM | POA: Diagnosis not present

## 2019-07-24 DIAGNOSIS — Z23 Encounter for immunization: Secondary | ICD-10-CM | POA: Diagnosis not present

## 2019-07-24 DIAGNOSIS — Z794 Long term (current) use of insulin: Secondary | ICD-10-CM | POA: Diagnosis not present

## 2019-07-24 DIAGNOSIS — I10 Essential (primary) hypertension: Secondary | ICD-10-CM | POA: Diagnosis not present

## 2019-07-25 DIAGNOSIS — R269 Unspecified abnormalities of gait and mobility: Secondary | ICD-10-CM | POA: Diagnosis not present

## 2019-07-25 DIAGNOSIS — M25562 Pain in left knee: Secondary | ICD-10-CM | POA: Diagnosis not present

## 2019-08-03 DIAGNOSIS — R269 Unspecified abnormalities of gait and mobility: Secondary | ICD-10-CM | POA: Diagnosis not present

## 2019-08-03 DIAGNOSIS — M25562 Pain in left knee: Secondary | ICD-10-CM | POA: Diagnosis not present

## 2019-08-08 ENCOUNTER — Encounter: Payer: Self-pay | Admitting: Psychiatry

## 2019-08-08 ENCOUNTER — Ambulatory Visit (INDEPENDENT_AMBULATORY_CARE_PROVIDER_SITE_OTHER): Payer: Medicare Other | Admitting: Psychiatry

## 2019-08-08 ENCOUNTER — Other Ambulatory Visit: Payer: Self-pay

## 2019-08-08 VITALS — BP 125/62 | HR 66

## 2019-08-08 DIAGNOSIS — G4710 Hypersomnia, unspecified: Secondary | ICD-10-CM | POA: Diagnosis not present

## 2019-08-08 DIAGNOSIS — F331 Major depressive disorder, recurrent, moderate: Secondary | ICD-10-CM | POA: Diagnosis not present

## 2019-08-08 DIAGNOSIS — G4730 Sleep apnea, unspecified: Secondary | ICD-10-CM

## 2019-08-08 DIAGNOSIS — Z630 Problems in relationship with spouse or partner: Secondary | ICD-10-CM

## 2019-08-08 MED ORDER — BUPROPION HCL ER (XL) 150 MG PO TB24: ORAL_TABLET | ORAL | 0 refills | Status: DC

## 2019-08-08 NOTE — Patient Instructions (Signed)
Start bupropion 75 mg.  Start bupropion XL 150 mg tablets 1 each morning for 2 weeks. If no side effects such as jitteriness, then increase to 2 tablets each morning which equals 300 mg daily.  When you run out of the current prescription the next prescription will be for 1 of the 300 mg tablets each morning

## 2019-08-08 NOTE — Progress Notes (Signed)
Jermaine Waters [id number] September 12, 1946 73 y.o.  Subjective:   Patient ID:  Jermaine Waters is a 73 y.o. (DOB Feb 28, 1946) male.  Chief Complaint:  Chief Complaint  Patient presents with  . Follow-up    Medication Management  . Depression    Medication Management  . Sleeping Problem  . Fatigue    HPI Jermaine Waters presents to the office today for follow-up of mood concerns.  Here once before Sept 2019 bc wife thought he was depressed but he denied sx and was not Rx meds.  Things changed and wants meds and therapy.  PCP started Wellbutrin a couple of weeks ago.  All my life self-sabotage with jobs and has to restart.  Fear of failure.  Some worsening health issues dropping productivity.  Wife frustrated with him and constantly advising and critical.  Marital stress and unhappiness worse bc together all the time.  Pt reports that mood is Anxious, Depressed and Dysphoric and describes anxiety as Moderate. Anxiety symptoms include: Excessive Worry, avoidant of stress or pressure or commitments.. Pt reports no sleep issues and 7 hours but prefers 8 hours. Pt reports that appetite is good. Pt reports that energy is poor and anhedonia, poor motivation and withdrawn from usual activities. Concentration is down slightly. Suicidal thoughts:  denied by patient.  Ritalin 10 QID for hypersomnolence but questions ADD bc is distractible.  Sleep apnea for 20 years on CPAP.  Chronically sleepy.  Past Psychiatric Medication Trials:  History [name].  On and off antidepressants for 20 years  Doesn't remember any being helpful.  Remote Wellbutrin ? Dose.  Doesn't remember the names of prior meds but remembers sexual SE with one. History of Nuvigil for hypersomnolence but he does not recall the response.  Review of Systems:  Review of Systems  Eyes: Positive for visual disturbance.  Musculoskeletal: Positive for arthralgias.  Neurological: Negative for tremors and weakness.    Medications: I  have reviewed the patient's current medications.  Current Outpatient Medications  Medication Sig Dispense Refill  . amLODipine (NORVASC) 2.5 MG tablet TK 1 T PO QD  12  . methylphenidate (RITALIN) 10 MG tablet Take 10 mg by mouth 4 (four) times daily.    Marland Kitchen NOVOLOG 100 UNIT/ML injection U IN INSULIN PUMP .APPROXIMATELY 80 UNITS A DAY  6  . pravastatin (PRAVACHOL) 20 MG tablet TK 1 T PO QD    . PROLENSA 0.07 % SOLN INT 1 GTT IN OS HS  6  . ramipril (ALTACE) 5 MG capsule Take 5 mg by mouth 2 (two) times daily.     Marland Kitchen SHINGRIX injection   0  . sildenafil (REVATIO) 20 MG tablet Take 20 mg by mouth as needed (take 2-5 tablets as needed for erections).     No current facility-administered medications for this visit.     Medication Side Effects: None  Allergies: No Known Allergies  Past Medical History:  Diagnosis Date  . Anxiety   . Arthritis   . Cancer ([place])    skin CA removed  . Depression   . Diabetes mellitus   . Diabetes mellitus without complication (HCC)    Type 1  . Elevated PSA 11/2014   PCP ordered for patient to take Cipro for 21 days  . Erectile dysfunction   . Hypercholesteremia   . Skin cancer of face   . Sleep apnea    wears CPAP nightly    Family History  Problem Relation Age of Onset  . Cancer Father  Esophageal/gastric cancer  . Cancer Mother        Metastatic cancer  . Rheum arthritis Mother   . Colon cancer Neg Hx   . Pancreatic cancer Neg Hx   . Stomach cancer Neg Hx     Social History   Socioeconomic History  . Marital status: Married    Spouse name: Not on file  . Number of children: 2  . Years of education: Not on file  . Highest education level: Not on file  Occupational History  . Occupation: Retired    Fish farm manager: [employer]  Social Needs  . Financial resource strain: Not on file  . Food insecurity    Worry: Not on file    Inability: Not on file  . Transportation needs    Medical: Not on file    Non-medical: Not on file   Tobacco Use  . Smoking status: Former Smoker    Packs/day: 2.00    Years: 15.00    Pack years: 30.00    Types: Cigarettes    Quit date: 1981    Years since quitting: 39.6  . Smokeless tobacco: Never Used  Substance and Sexual Activity  . Alcohol use: No    Comment: 1986  . Drug use: No  . Sexual activity: Not on file  Lifestyle  . Physical activity    Days per week: Not on file    Minutes per session: Not on file  . Stress: Not on file  Relationships  . Social Herbalist on phone: Not on file    Gets together: Not on file    Attends religious service: Not on file    Active member of club or organization: Not on file    Attends meetings of clubs or organizations: Not on file    Relationship status: Not on file  . Intimate partner violence    Fear of current or ex partner: Not on file    Emotionally abused: Not on file    Physically abused: Not on file    Forced sexual activity: Not on file  Other Topics Concern  . Not on file  Social History Narrative    Merged History Encounter         Past Medical History, Surgical history, Social history, and Family history were reviewed and updated as appropriate.   Please see review of systems for further details on the patient's review from today.   Objective:   Physical Exam:  BP 125/62   Pulse 66   Physical Exam Constitutional:      General: He is not in acute distress.    Appearance: He is well-developed.  Musculoskeletal:        General: No deformity.  Neurological:     Mental Status: He is alert and oriented to person, place, and time.     Coordination: Coordination normal.  Psychiatric:        Attention and Perception: Attention and perception normal. He does not perceive auditory or visual hallucinations.        Mood and Affect: Mood is anxious and depressed. Affect is not labile, blunt, angry or inappropriate.        Speech: Speech normal.        Behavior: Behavior normal.        Thought Content:  Thought content normal. Thought content is not paranoid or delusional. Thought content does not include homicidal or suicidal ideation. Thought content does not include homicidal or suicidal plan.  Cognition and Memory: Cognition and memory normal.        Judgment: Judgment normal.     Comments: Insight intact     Lab Review:     Component Value Date/Time   NA 139 12/02/2014 0842   K 4.3 12/02/2014 0842   CL 106 12/02/2014 0842   CO2 25 12/02/2014 0842   GLUCOSE 223 (H) 12/02/2014 0842   BUN 24 (H) 12/02/2014 0842   CREATININE 0.87 12/02/2014 0842   CALCIUM 8.8 12/02/2014 0842   GFRNONAA 88 (L) 12/02/2014 0842   GFRAA >90 12/02/2014 0842       Component Value Date/Time   WBC 4.9 12/02/2014 0843   RBC 4.90 12/02/2014 0843   HGB 14.8 12/02/2014 0843   HCT 44.1 12/02/2014 0843   PLT 226 12/02/2014 0843   MCV 90.0 12/02/2014 0843   MCH 30.2 12/02/2014 0843   MCHC 33.6 12/02/2014 0843   RDW 13.1 12/02/2014 0843    No results found for: POCLITH, LITHIUM   No results found for: PHENYTOIN, PHENOBARB, VALPROATE, CBMZ   .res Assessment: Plan:    Jermaine Waters was seen today for follow-up, depression, sleeping problem and fatigue.  Diagnoses and all orders for this visit:  Major depressive disorder, recurrent episode, moderate (HCC)  Sleep apnea with hypersomnolence  Marital conflict  When last seen he denied significant depressive symptoms although his wife thought he was depressed.  This time he acknowledges that he does have some depressive symptoms as noted.  He realizes he also needs therapy for this tendency to self sabotage and also for marital conflict that has resulted.  Increase Wellbutrin to XL150 mg for 2 weeks and if tolerated well increase to 300 mg daily which is the standard dose.Marland Kitchen Disc SE in detail.  Discussed the potential interaction with caffeine as well as the potential for extra side effects of jitteriness related to taking the Ritalin.  He will  contact us if he has any of those problems.  Discussed the dry mouth as well.  His primary care doctor started him on very low-dose Wellbutrin which is not likely to help but overall it is a fine choice of an antidepressant.  Wellbutrin also has the advantage that it will not worsen his hypersomnolence which is not entirely managed. Discussed alternative antidepressants if necessary.  Refer for counseling to Dr. Rica Mote. For depression and chronic self sabotage and marital conflict. He agrees to this treatment plan.  Follow-up 8 weeks After Visit Summary for patient specific instructions PleasStart bupropion 75 mg.  Start bupropion XL 150 mg tablets 1 each morning for 2 weeks. If no side effects such as jitteriness, then increase to 2 tablets each morning which equals 300 mg daily.  When you run out of the current prescription the next prescription will be for 1 of the 300 mg tablets each morning.    Greater than 50% of 30 min face to face time with patient was spent on counseling and coordination of care.   FU 8 weeks  Lynder Parents, MD, DFAPA   Future Appointments  Date Time Provider [place]  08/29/2019  7:30 AM Hayden Pedro, MD [facility] None  10/11/2019  8:15 AM Evelina Bucy, DPM [facility]  10/25/2019  3:30 PM Dohmeier, Asencion Partridge, MD [facility] None    No orders of the defined types were placed in this encounter.   -------------------------------

## 2019-08-16 DIAGNOSIS — G4733 Obstructive sleep apnea (adult) (pediatric): Secondary | ICD-10-CM | POA: Diagnosis not present

## 2019-08-16 DIAGNOSIS — F329 Major depressive disorder, single episode, unspecified: Secondary | ICD-10-CM | POA: Diagnosis not present

## 2019-08-16 DIAGNOSIS — E10319 Type 1 diabetes mellitus with unspecified diabetic retinopathy without macular edema: Secondary | ICD-10-CM | POA: Diagnosis not present

## 2019-08-17 DIAGNOSIS — R269 Unspecified abnormalities of gait and mobility: Secondary | ICD-10-CM | POA: Diagnosis not present

## 2019-08-17 DIAGNOSIS — M25562 Pain in left knee: Secondary | ICD-10-CM | POA: Diagnosis not present

## 2019-08-23 ENCOUNTER — Ambulatory Visit (INDEPENDENT_AMBULATORY_CARE_PROVIDER_SITE_OTHER): Payer: Medicare Other | Admitting: Psychiatry

## 2019-08-23 ENCOUNTER — Other Ambulatory Visit: Payer: Self-pay

## 2019-08-23 DIAGNOSIS — Z630 Problems in relationship with spouse or partner: Secondary | ICD-10-CM

## 2019-08-23 DIAGNOSIS — F4322 Adjustment disorder with anxiety: Secondary | ICD-10-CM

## 2019-08-23 DIAGNOSIS — F331 Major depressive disorder, recurrent, moderate: Secondary | ICD-10-CM | POA: Diagnosis not present

## 2019-08-23 DIAGNOSIS — F341 Dysthymic disorder: Secondary | ICD-10-CM | POA: Diagnosis not present

## 2019-08-23 NOTE — Progress Notes (Addendum)
PROBLEM-FOCUSED INITIAL PSYCHOTHERAPY EVALUATION Jermaine Moore, PhD LP [facility]  Name: Jermaine Waters Date: 08/23/2019 Time spent: 50 min MRN: [id number] DOB: 11/10/1946 Guardian/Payee: self  PCP: Leanna Battles (Inactive) Documentation requested on this visit: No  PROBLEM HISTORY Reason for Visit /Presenting Problem:  Chief Complaint  Patient presents with  . Establish Care  . Depression  . Family Problem    Narrative/History of Present Illness Figures he needs [place] to hold him accountable for some things.  Referred by Dr. Clovis Pu for treatment of depression and self-identified self-sabotage .  PT reports he has long history of sheltering himself from consequences, wife now retired and home more, highly frustrated, keeps leveling criticisms.  He withdraws in response, the cycle continues.  Says most mornings she starts with asking what he will accomplish then goes into what was lacking yesterday, cycle of setup and negativity.  Knows he has long habit of numbing in response to conflict/criticism and trying to do everything alone, without asking for help.  In jobs has been fired repeatedly for taking stands against practices he believed were wrong, only instead of resigning he waited to be fired, being highly anxious about venturing out.  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  Childhood was encouraged to go it alone -- dx diabetes 73yo, parents' approach was that life is tough.  Doctor in the hospital told him he could live relatively normally, just he would die young, like 9.  Adopted a live for now approach, blew off school, began drinking, smoking.  Brief epiphanies in 73s, then on reaching 73 realized he was ruining his life and he would outlive predictions, needed to be better off.  Stopped drinking c. 73, smoking c. 73.  Turned loose of  party friends, while working his ill-fated dream.  Didn't need a sponsor to keep from drinking, but figures he missed out having a sponsor to mentor him.  Has been in [facility] for several years, has been Higher education careers adviser, recently.  Beneficial to do this.  Now experiencing diabetic wear and tear as well -- eye problems, and neuropathy makes it take 2 minutes to button a shirt.  Frightens him, frankly.  Knows wife had to grow up taking care of sick family and she has had to be the one to stand by as emergency support for low blood sugar episodes.  Took years at 5 injections a day before agreed to the insulin pump, which has been helpful but also provides 20-30 distracting messages a day.  Knows he needs to get regular exercise as well.  Seeing kids show similar behaviors that threaten to undermine them -- daughter (eldest) making unreasonable demands of her employer and going through a sticky divorce.  Son Frederico Hamman, in D&A recovery 5 years, very supportive wife, but trouble forming a long-range plan.  In fact, they want to buy a 115 acre property for the dream of a farm and wanted 6-figure help, but backed off.  W [name] pressing him to intervene but squeamish about conflict, sense of judgment imposed if he does.  Father was inactive, conflict-avoidant example.  Prior Psychiatric Assessment/Treatment:   Outpatient treatment: med mgmt Dr. Clovis Pu  Psychiatric hospitalization: none stated Psychological assessment/testing: none stated   Abuse History: Victim of abuse: No Victim of neglect: Yes emotional.  Parents were alcoholic Perpetrator of abuse/neglect: No.   Witness / Exposure to Domestic Violence: No.  Witness to [employer] Violence:  No.   Protective Services Involvement: No.   Report needed: No.    Substance Abuse History: Current substance abuse: No.   History of impactful substance use/abuse: Yes.     FAMILY/SOCIAL HISTORY Family of origin -- see above Family of intention/current living situation  -- see above Education -- not assessed Vocation -- retired  Publishing rights manager -- Current income from [employer], with no stated concerns. Spiritually -- Spiritual/religious identification Protestant.  Practicing: Yes Some history of 12-step involvement. Enjoyable activities -- Not assessed.  Other situational factors affecting treatment and prognosis: Stressors from the following areas: Marital or family conflict, health Barriers to service: none stated  Notable cultural sensitivities: none stated Strengths: Family, Spirituality and Able to Communicate Effectively   MED/SURG HISTORY Med/surg history was not reviewed with PT at this time.   Past Medical History:  Diagnosis Date  . Anxiety   . Arthritis   . Cancer ([place])    skin CA removed  . Depression   . Diabetes mellitus   . Diabetes mellitus without complication (HCC)    Type 1  . Elevated PSA 11/2014   PCP ordered for patient to take Cipro for 21 days  . Erectile dysfunction   . Hypercholesteremia   . Skin cancer of face   . Sleep apnea    wears CPAP nightly     Past Surgical History:  Procedure Laterality Date  . APPENDECTOMY    . CHOLECYSTECTOMY    . COLONOSCOPY    . EYE SURGERY Bilateral    laser  . ROTATOR CUFF REPAIR Right   . SEPTOPLASTY Bilateral 12/11/2014   Procedure: NASAL BILATERAL SEPTOPLASTY;  Surgeon: Jerrell Belfast, MD;  Location: [place] [facility];  Service: ENT;  Laterality: Bilateral;  . SHOULDER ARTHROSCOPY WITH ROTATOR CUFF REPAIR AND SUBACROMIAL DECOMPRESSION Right 04/05/2013   Procedure: RIGHT SHOULDER ARTHROSCOPY SUBACROMIAL DECOMPRESSION DISTAL CLAVICLE RESECTION AND ROTATOR CUFF REPAIR ;  Surgeon: Marin Shutter, MD;  Location: [facility];  Service: Orthopedics;  Laterality: Right;  . TONSILLECTOMY    . TURBINATE REDUCTION N/A 12/11/2014   Procedure: INFERIOR TURBINATE REDUCTION;  Surgeon: Jerrell Belfast, MD;  Location: [place];  Service: ENT;  Laterality: N/A;    No Known Allergies  Medications (as  listed in Epic): Current Outpatient Medications  Medication Sig Dispense Refill  . amLODipine (NORVASC) 2.5 MG tablet TK 1 T PO QD  12  . buPROPion (WELLBUTRIN XL) 150 MG 24 hr tablet TAKE 1 TABLET BY MOUTH EVERY MORNING FOR 2 WEEKS AND IF TOLERATED INCREASE TO 2 TABLETS IN THE MORNING 30 tablet 2  . methylphenidate (RITALIN) 10 MG tablet Take 10 mg by mouth 4 (four) times daily.    Marland Kitchen NOVOLOG 100 UNIT/ML injection U IN INSULIN PUMP .APPROXIMATELY 80 UNITS A DAY  6  . pravastatin (PRAVACHOL) 20 MG tablet TK 1 T PO QD    . PROLENSA 0.07 % SOLN INT 1 GTT IN OS HS  6  . ramipril (ALTACE) 5 MG capsule Take 5 mg by mouth 2 (two) times daily.     Marland Kitchen SHINGRIX injection   0  . sildenafil (REVATIO) 20 MG tablet Take 20 mg by mouth as needed (take 2-5 tablets as needed for erections).     No current facility-administered medications for this visit.     MENTAL STATUS AND OBSERVATIONS Appearance:   Casual and Neat     Behavior:  Appropriate, forthcoming, some joking for rapport  Motor:  Normal  Speech/Language:   Clear and Coherent  Affect:  Appropriate  Mood:  anxious and dysthymic  Thought process:  normal  Thought content:    WNL  Sensory/Perceptual disturbances:    WNL  Orientation:  grossly intact  Attention:  Good  Concentration:  Good  Memory:  WNL  Fund of knowledge:   Good  Insight:    Good  Judgment:   Good  Impulse Control:  Good  Initial Risk Assessment: Danger to Self: No Self-injurious Behavior: No Danger to Others: No Physical Aggression / Violence: No Duty to Warn: No Access to Firearms a concern: No Gang Involvement: No Patient / guardian was educated about steps to take if suicide or homicide risk level increases between visits: yes . While future psychiatric events cannot be accurately predicted, the patient does not currently require acute inpatient psychiatric care and does not currently meet [facility] involuntary commitment criteria.   DIAGNOSIS:    ICD-10-CM    1. Major depressive disorder, recurrent episode, moderate (HCC)  F33.1   2. Marital conflict  0000000     INITIAL TREATMENT: . Ethical orientation and verbal consents to o privacy rights including, but not limited to, HIPAA provisions and any questions, EMR and use of e-PHI o patient responsibilities, including scheduling and fair notice of changes, method of visit options and regulatory and financial conditions affecting them o expectations for working relationship in psychotherapy o expectations and consents for working partnerships with other health care disciplines, especially including medication and other behavioral health providers . Support/validation for distressing symptoms, negativity in marriage, and health issues . Confirmed rapport . Oriented to tactics for dealing with excessive negativity, including active listening, assertive inquiry . Encouraged in walking for anxiety, fitness, and blood sugar regulation . Explored need for self-forgiveness . Estimated outlook for therapy  Plan: . Walking . Practice cecking in with feelings for better awareness . Consider making a forgiveness list, focusing on the forgiving "the guy who tried" to go out on his own, and the guy who had to weigh out whether to object overtly or keep silent . Maintain medication as prescribed and work faithfully with relevant prescriber(s) if any changes are desired or seem indicated . Call the clinic on-call service, present to ER, or call 911 if any life-threatening psychiatric crisis Return in about 2 weeks (around 09/06/2019) for recommend scheduling ahead, preferably 3 ahead.  Blanchie Serve, PhD  Jermaine Moore, PhD LP Clinical Psychologist, [facility] [address] [place],  [address] [phone]

## 2019-08-29 ENCOUNTER — Encounter (INDEPENDENT_AMBULATORY_CARE_PROVIDER_SITE_OTHER): Payer: Medicare Other | Admitting: Ophthalmology

## 2019-08-29 ENCOUNTER — Other Ambulatory Visit: Payer: Self-pay

## 2019-08-29 DIAGNOSIS — H43813 Vitreous degeneration, bilateral: Secondary | ICD-10-CM | POA: Diagnosis not present

## 2019-08-29 DIAGNOSIS — E11311 Type 2 diabetes mellitus with unspecified diabetic retinopathy with macular edema: Secondary | ICD-10-CM

## 2019-08-29 DIAGNOSIS — E113512 Type 2 diabetes mellitus with proliferative diabetic retinopathy with macular edema, left eye: Secondary | ICD-10-CM | POA: Diagnosis not present

## 2019-08-29 DIAGNOSIS — E113591 Type 2 diabetes mellitus with proliferative diabetic retinopathy without macular edema, right eye: Secondary | ICD-10-CM

## 2019-08-29 DIAGNOSIS — H34812 Central retinal vein occlusion, left eye, with macular edema: Secondary | ICD-10-CM

## 2019-08-31 DIAGNOSIS — M25562 Pain in left knee: Secondary | ICD-10-CM | POA: Diagnosis not present

## 2019-08-31 DIAGNOSIS — R269 Unspecified abnormalities of gait and mobility: Secondary | ICD-10-CM | POA: Diagnosis not present

## 2019-08-31 DIAGNOSIS — M799 Soft tissue disorder, unspecified: Secondary | ICD-10-CM | POA: Diagnosis not present

## 2019-09-01 ENCOUNTER — Other Ambulatory Visit: Payer: Self-pay | Admitting: Psychiatry

## 2019-09-07 ENCOUNTER — Ambulatory Visit: Payer: Federal, State, Local not specified - PPO | Admitting: Psychiatry

## 2019-09-14 DIAGNOSIS — R269 Unspecified abnormalities of gait and mobility: Secondary | ICD-10-CM | POA: Diagnosis not present

## 2019-09-14 DIAGNOSIS — M25562 Pain in left knee: Secondary | ICD-10-CM | POA: Diagnosis not present

## 2019-09-16 NOTE — Progress Notes (Signed)
Psychotherapy Progress Note [facility] [name], PhD LP  Patient ID: Jermaine Waters     MRN: [id number]     Therapy format: Individual psychotherapy Date: 09/17/2019     Start: 11:13a Stop: 12:01p Time Spent: 48 min Location: in-person   Session narrative (presenting needs, interim history, self-report of stressors and symptoms, applications of prior therapy, status changes, and interventions made in session) Today is first day of 62th year on insulin.  Sees living his whole life with "diabetic depression", rooted in the sense that he can't be truly adequate to take care of himself.  Worries most that he will become a burden to his caretakers, primarily wife [name], who spent down a lot taking care of her parents before.  Insulin pump (3 yrs) and continuous glucose monitoring help.  In 62 years, there have been maybe 2 calls to EMS.  Cecilia does sleep lightly, still, watchful for him to have an episode.  Would like her to relax a bit.  Wellbutrin 300 since 2-3 weeks ago, had initial difficulty with insomnia, adjusted.  Got dreams back.  W says she is less alert, slower to respond, but PT feels more emotionally responsive, actually.  Self-interpretation that he feels more at risk of lashing out, so he's being more careful with her, which may account for seeming more inert.  Have been a couple of "high volume" events with [name] the last couple weeks, in fact.  Says she admits being in a "loop" of critiquing him, explaining she just feels the need.  Did get some calm and partial understanding about being in anxiety and self-doubt, and some empathy, followed by unsolicited advice on how to fix things.  Handled the advice by framing smaller things he can manage, which successfully bought several hours of peace, unexpectedly.    Affirmed in some detail ways he was assertive, possibly without realizing it, as early successes in overcoming fear of conflict.  Pointed out how he  risked vulnerability revealing anxiety and how he allowed wife an otherwise abrasive "teaching" moment without hammering, hiding, obscuring, or simply knuckling under to perceived control.  Very unusual to his ears to hear praise like this, hard time initially absorbing it.  Discussed assertive tactics for further dealing with uninvited teaching and direction -- mainly, leading with empathy or validation and posing a question (e.g., "What do you want to have happen when you tell me that?" "How do you want this to work?" "I believe you mean well.  Is it OK if I decide what to do with that idea?").  Misplaced his notes from the first session.  Made detailed notes for himself in a dedicated journal brought to this one.    Therapeutic modalities: Cognitive Behavioral Therapy, Assertiveness/Communication, Solution-Oriented/Positive Psychology and Ego-Supportive  Mental Status/Observations:  Appearance:   Casual     Behavior:  Appropriate  Motor:  Normal  Speech/Language:   Clear and Coherent  Affect:  Appropriate  Mood:  anxious and dysthymic  Thought process:  normal  Thought content:    WNL  Sensory/Perceptual disturbances:    WNL  Orientation:  grossly intact  Attention:  Good  Concentration:  Fair  Memory:  WNL  Insight:    Good  Judgment:   Good  Impulse Control:  Fair   Risk Assessment: Danger to Self: No Self-injurious Behavior: No Danger to Others: No Physical Aggression / Violence: No Duty to Warn: No Access to Firearms a concern: No  Assessment of progress:  progressing  Diagnosis:   ICD-10-CM   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 Major depressive disorder, recurrent episode, moderate (HCC)  F33.1   3. Marital conflict  0000000   4. Diabetes mellitus type 1, controlled, insulin dependent ([place])  E10.9     Plan:  . Continue journaling . Try out communication tactics for unwanted helping/teaching . Other recommendations/advice as noted above . Continue to utilize  previously learned skills ad lib . Maintain medication as prescribed and work faithfully with relevant prescriber(s) if any changes are desired or seem indicated . Call the clinic on-call service, present to ER, or call 911 if any life-threatening psychiatric crisis Return in about 2 weeks (around 10/01/2019).  Blanchie Serve, PhD [name], PhD LP Clinical Psychologist, [facility] [address] [place], [place] [address] [phone]

## 2019-09-17 ENCOUNTER — Ambulatory Visit (INDEPENDENT_AMBULATORY_CARE_PROVIDER_SITE_OTHER): Payer: Medicare Other | Admitting: Psychiatry

## 2019-09-17 ENCOUNTER — Other Ambulatory Visit: Payer: Self-pay

## 2019-09-17 DIAGNOSIS — F341 Dysthymic disorder: Secondary | ICD-10-CM

## 2019-09-17 DIAGNOSIS — F331 Major depressive disorder, recurrent, moderate: Secondary | ICD-10-CM | POA: Diagnosis not present

## 2019-09-17 DIAGNOSIS — E109 Type 1 diabetes mellitus without complications: Secondary | ICD-10-CM

## 2019-09-17 DIAGNOSIS — Z630 Problems in relationship with spouse or partner: Secondary | ICD-10-CM

## 2019-09-20 ENCOUNTER — Telehealth: Payer: Self-pay | Admitting: Psychiatry

## 2019-09-20 NOTE — Telephone Encounter (Signed)
He has been on Wellbutrin XL 300 mg daily for about 1 month and not seeing any improvement.  He can increase Wellbutrin XL to 450 mg daily.  He will need a prescription for Wellbutrin XL 150 mg tablets 3 every morning.  This is assuming he is tolerating the current dose of Wellbutrin well.  It does not cause fatigue.

## 2019-09-20 NOTE — Telephone Encounter (Signed)
Jermaine Waters called to report that he has had no response to Wellbutrin. Can something else be tried?  Please call to discuss. Appt 10/10/19

## 2019-09-21 NOTE — Telephone Encounter (Signed)
Left voicemail to call back with information 

## 2019-10-01 ENCOUNTER — Ambulatory Visit (INDEPENDENT_AMBULATORY_CARE_PROVIDER_SITE_OTHER): Payer: Medicare Other | Admitting: Psychiatry

## 2019-10-01 ENCOUNTER — Other Ambulatory Visit: Payer: Self-pay

## 2019-10-01 DIAGNOSIS — Z630 Problems in relationship with spouse or partner: Secondary | ICD-10-CM | POA: Diagnosis not present

## 2019-10-01 DIAGNOSIS — F341 Dysthymic disorder: Secondary | ICD-10-CM

## 2019-10-01 DIAGNOSIS — E109 Type 1 diabetes mellitus without complications: Secondary | ICD-10-CM | POA: Diagnosis not present

## 2019-10-01 DIAGNOSIS — F331 Major depressive disorder, recurrent, moderate: Secondary | ICD-10-CM

## 2019-10-01 NOTE — Progress Notes (Signed)
Psychotherapy Progress Note [facility] [name], PhD LP  Patient ID: Jermaine Waters     MRN: [id number]     Therapy format: Individual psychotherapy Date: 10/01/2019     Start: 11:20a Stop: 12:11p Time Spent: 50 min Location: in-person  Session narrative (presenting needs, interim history, self-report of stressors and symptoms, applications of prior therapy, status changes, and interventions made in session) Wife [name] "skipped town" for a week to visit D.  Time beforehand spent managing her angst about travel -- typically overpacks, sometimes including food, certainly clothing and meds.  Felt relief on her leaving, with expectation of 10 days to focus, not her carping on things.  No sooner had time to himself than he found himself procrastinating, and now the self-recriminations have begun for being lazy, etc.  Sees himself habitually self-motivating through negativity, fear, which never hold for long anyway, but the shaming self will stick 24 hrs for a 2-hour "indulgence".  Has mapped out cycle between himself and wife -- she criticizes, he shrinks.  She states the problem of not socializing much, implication he is in the way, but their negativity with each other could be a turn-off.  Also reveals [name] will drink fairly regularly, more so when anxious before company.  Also hard of hearing but denies it.    Reframed tasks as worth doing for his own good reasons, thought experiment what if she never came home to witness it, would he have his own reasons.  Reframed "real" freedom as being able to do what someone else wants but for his own good reasons.  Proposed permission to protest, too -- a therapeutic "I don't wanna", to free up the ability to choose, authentically, to do something once negative feelings are respected.  Brainstormed priorities, including cleaning out files (for his own satisfaction, primarily) and checking/cleaning the kitchen drain (for multiple  reasons).  Asks about possibility of confronting wife's drinking -- acknowledged it is one of several possible confrontations, important to separate issues and pursue one at a time, but if interested, could.  Therapeutic modalities: Cognitive Behavioral Therapy, Assertiveness/Communication and Solution-Oriented/Positive Psychology  Mental Status/Observations:  Appearance:   Casual and Neat     Behavior:  Appropriate  Motor:  Normal  Speech/Language:   Clear and Coherent  Affect:  Appropriate  Mood:  anxious and dysthymic  Thought process:  normal  Thought content:    WNL and self-shaming  Sensory/Perceptual disturbances:    WNL  Orientation:  grossly intact  Attention:  Good  Concentration:  Good  Memory:  WNL  Insight:    Good  Judgment:   Good  Impulse Control:  Good   Risk Assessment: Danger to Self: No Self-injurious Behavior: No Danger to Others: No Physical Aggression / Violence: No Duty to Warn: No Access to Firearms a concern: No  Assessment of progress:  progressing  Diagnosis:   ICD-10-CM   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 Major depressive disorder, recurrent episode, moderate (HCC)  F33.1   3. Marital conflict  0000000   4. Diabetes mellitus type 1, controlled, insulin dependent ([place])  E10.9     Plan:  . Take up priority tasks as planned . Practice "care to" reasoning about tasks . As needed, practice saying "I don't want to aloud" before deciding . As interested, share with wife how honestly saying aloud that he doesn't want to do something is an exercise in unclogging his own motivation -- if he does that in  her presence, he wants her to know it's in service of becoming truly willing . Option to address wife about drinking or another undesirable behavior -- recommend 5-step assertiveness process . Continue working with self-forgiveness and reframing . Other recommendations/advice as noted above . Continue to utilize previously learned skills ad  lib . Maintain medication as prescribed and work faithfully with relevant prescriber(s) if any changes are desired or seem indicated . Call the clinic on-call service, present to ER, or call 911 if any life-threatening psychiatric crisis No follow-ups on file.  Blanchie Serve, PhD [name], PhD LP Clinical Psychologist, [facility] [address], [place], [place] [address] [phone]

## 2019-10-10 ENCOUNTER — Ambulatory Visit (INDEPENDENT_AMBULATORY_CARE_PROVIDER_SITE_OTHER): Payer: Medicare Other | Admitting: Psychiatry

## 2019-10-10 ENCOUNTER — Other Ambulatory Visit: Payer: Self-pay

## 2019-10-10 ENCOUNTER — Encounter: Payer: Self-pay | Admitting: Psychiatry

## 2019-10-10 VITALS — BP 135/65 | HR 68

## 2019-10-10 DIAGNOSIS — F331 Major depressive disorder, recurrent, moderate: Secondary | ICD-10-CM | POA: Diagnosis not present

## 2019-10-10 DIAGNOSIS — Z630 Problems in relationship with spouse or partner: Secondary | ICD-10-CM | POA: Diagnosis not present

## 2019-10-10 DIAGNOSIS — F341 Dysthymic disorder: Secondary | ICD-10-CM | POA: Diagnosis not present

## 2019-10-10 MED ORDER — BUPROPION HCL ER (XL) 150 MG PO TB24: 450.0000 mg | ORAL_TABLET | Freq: Every day | ORAL | 0 refills | Status: DC

## 2019-10-10 NOTE — Progress Notes (Signed)
Jermaine Waters [id number] 1946-08-03 73 y.o.  Subjective:   Patient ID:  Jermaine Waters is a 73 y.o. (DOB 06/26/1946) male.  Chief Complaint:  Chief Complaint  Patient presents with  . Follow-up    Medication Management  . Depression    Medication Management  . Medication Refill    Wellbutrin- 300 Mg tablet daily    HPI [name] presents to the office today for follow-up of mood concerns.  Patient was last seen August 08, 2019.  The assessment was depression.  The following was done: PleasE STOP  bupropion 75 mg. Start bupropion XL 150 mg tablets 1 each morning for 2 weeks. If no side effects such as jitteriness, then increase to 2 tablets each morning which equals 300 mg daily. He continued Ritalin 10 QID.  A little more optimistic and less tense.  Therapy helpng also. No SE.  Not jitttery nor edgy.  Wife critical that he's less responsive, but maybe I'm less on edge.Energy is fine.  Interests hard to answer bc not a lot of outside activities left DT Covid.  Couple of small groups meeting with Zoom.  Most of free time before was singing but can't do it now.  Things changed and wants meds and therapy.  PCP started Wellbutrin a couple of weeks ago.  All my life self-sabotage with jobs and has to restart.  Fear of failure.  Some worsening health issues dropping productivity.  Wife frustrated with him and constantly advising and critical.  Marital stress and unhappiness worse bc together all the time.  Pt reports that mood is Anxious, Depressed and Dysphoric and describes anxiety as Moderate. Anxiety symptoms include: Excessive Worry, avoidant of stress or pressure or commitments.. Pt reports no sleep issues and 7 hours but prefers 8 hours. Pt reports that appetite is good. Pt reports that energy is poor and anhedonia, poor motivation and withdrawn from usual activities. Concentration is down slightly. Suicidal thoughts:  denied by patient.  Ritalin 10 QID for hypersomnolence  but questions ADD bc is distractible.  Sleep apnea for 20 years on CPAP.  Chronically sleepy.  Past Psychiatric Medication Trials:  History [name].  On and off antidepressants for 20 years  Doesn't remember any being helpful.  Remote Wellbutrin ? Dose.  Doesn't remember the names of prior meds but remembers sexual SE with one. History of Nuvigil for hypersomnolence but he does not recall the response.  Review of Systems:  Review of Systems  Eyes: Positive for visual disturbance.  Musculoskeletal: Positive for arthralgias.  Neurological: Negative for tremors and weakness.  Psychiatric/Behavioral: Positive for depression.    Medications: I have reviewed the patient's current medications.  Current Outpatient Medications  Medication Sig Dispense Refill  . amLODipine (NORVASC) 2.5 MG tablet TK 1 T PO QD  12  . buPROPion (WELLBUTRIN XL) 150 MG 24 hr tablet Take 3 tablets (450 mg total) by mouth daily. 270 tablet 0  . methylphenidate (RITALIN) 10 MG tablet Take 10 mg by mouth 4 (four) times daily.    Marland Kitchen NOVOLOG 100 UNIT/ML injection U IN INSULIN PUMP .APPROXIMATELY 80 UNITS A DAY  6  . pravastatin (PRAVACHOL) 20 MG tablet TK 1 T PO QD    . PROLENSA 0.07 % SOLN INT 1 GTT IN OS HS  6  . ramipril (ALTACE) 5 MG capsule Take 5 mg by mouth 2 (two) times daily.     Marland Kitchen SHINGRIX injection   0  . sildenafil (REVATIO) 20 MG tablet Take 20 mg  by mouth as needed (take 2-5 tablets as needed for erections).     No current facility-administered medications for this visit.     Medication Side Effects: None  Allergies: No Known Allergies  Past Medical History:  Diagnosis Date  . Anxiety   . Arthritis   . Cancer ([place])    skin CA removed  . Depression   . Diabetes mellitus   . Diabetes mellitus without complication (HCC)    Type 1  . Elevated PSA 11/2014   PCP ordered for patient to take Cipro for 21 days  . Erectile dysfunction   . Hypercholesteremia   . Skin cancer of face   . Sleep  apnea    wears CPAP nightly    Family History  Problem Relation Age of Onset  . Cancer Father        Esophageal/gastric cancer  . Cancer Mother        Metastatic cancer  . Rheum arthritis Mother   . Colon cancer Neg Hx   . Pancreatic cancer Neg Hx   . Stomach cancer Neg Hx     Social History   Socioeconomic History  . Marital status: Married    Spouse name: Not on file  . Number of children: 2  . Years of education: Not on file  . Highest education level: Not on file  Occupational History  . Occupation: Retired    Fish farm manager: [employer]  Social Needs  . Financial resource strain: Not on file  . Food insecurity    Worry: Not on file    Inability: Not on file  . Transportation needs    Medical: Not on file    Non-medical: Not on file  Tobacco Use  . Smoking status: Former Smoker    Packs/day: 2.00    Years: 15.00    Pack years: 30.00    Types: Cigarettes    Quit date: 1981    Years since quitting: 39.8  . Smokeless tobacco: Never Used  Substance and Sexual Activity  . Alcohol use: No    Comment: 1986  . Drug use: No  . Sexual activity: Not on file  Lifestyle  . Physical activity    Days per week: Not on file    Minutes per session: Not on file  . Stress: Not on file  Relationships  . Social Herbalist on phone: Not on file    Gets together: Not on file    Attends religious service: Not on file    Active member of club or organization: Not on file    Attends meetings of clubs or organizations: Not on file    Relationship status: Not on file  . Intimate partner violence    Fear of current or ex partner: Not on file    Emotionally abused: Not on file    Physically abused: Not on file    Forced sexual activity: Not on file  Other Topics Concern  . Not on file  Social History Narrative    Merged History Encounter         Past Medical History, Surgical history, Social history, and Family history were reviewed and updated as appropriate.    Please see review of systems for further details on the patient's review from today.   Objective:   Physical Exam:  BP 135/65   Pulse 68   Physical Exam Constitutional:      General: He is not in acute distress.  Appearance: He is well-developed.  Musculoskeletal:        General: No deformity.  Neurological:     Mental Status: He is alert and oriented to person, place, and time.     Coordination: Coordination normal.  Psychiatric:        Attention and Perception: Attention and perception normal. He does not perceive auditory or visual hallucinations.        Mood and Affect: Mood is anxious and depressed. Affect is not labile, blunt, angry or inappropriate.        Speech: Speech normal.        Behavior: Behavior normal.        Thought Content: Thought content normal. Thought content is not paranoid or delusional. Thought content does not include homicidal or suicidal ideation. Thought content does not include homicidal or suicidal plan.        Cognition and Memory: Cognition and memory normal.        Judgment: Judgment normal.     Comments: Insight intact     Lab Review:     Component Value Date/Time   NA 139 12/02/2014 0842   K 4.3 12/02/2014 0842   CL 106 12/02/2014 0842   CO2 25 12/02/2014 0842   GLUCOSE 223 (H) 12/02/2014 0842   BUN 24 (H) 12/02/2014 0842   CREATININE 0.87 12/02/2014 0842   CALCIUM 8.8 12/02/2014 0842   GFRNONAA 88 (L) 12/02/2014 0842   GFRAA >90 12/02/2014 0842       Component Value Date/Time   WBC 4.9 12/02/2014 0843   RBC 4.90 12/02/2014 0843   HGB 14.8 12/02/2014 0843   HCT 44.1 12/02/2014 0843   PLT 226 12/02/2014 0843   MCV 90.0 12/02/2014 0843   MCH 30.2 12/02/2014 0843   MCHC 33.6 12/02/2014 0843   RDW 13.1 12/02/2014 0843    No results found for: POCLITH, LITHIUM   No results found for: PHENYTOIN, PHENOBARB, VALPROATE, CBMZ   .res Assessment: Plan:    Zohar was seen today for follow-up, depression and medication  refill.  Diagnoses and all orders for this visit:  Major depressive disorder, recurrent episode, moderate ([place])  Early onset dysthymia  Marital conflict  Other orders -     buPROPion (WELLBUTRIN XL) 150 MG 24 hr tablet; Take 3 tablets (450 mg total) by mouth daily.     When first seen he denied significant depressive symptoms although his wife thought he was depressed.  Last time he acknowledges that he does have some depressive symptoms as noted.  He realizes he also needs therapy for this tendency to self sabotage and also for marital conflict that has resulted.  Partial response to Wellbutrin XL 300 with improvement in sadness and outlook and energy.  Increase Wellbutrin to XL150 mg to 450 mg each AM to max response.  If there's no difference then we'll reduce it back to 300 mg daily.. Disc SE in detail.  Discussed the potential interaction with caffeine as well as the potential for extra side effects of jitteriness related to taking the Ritalin.  He will contact us if he has any of those problems.  Discussed the dry mouth as well.  His primary care doctor started him on very low-dose Wellbutrin which is not likely to help but overall it is a fine choice of an antidepressant.  Wellbutrin also has the advantage that it will not worsen his hypersomnolence which is not entirely managed.  And it may in fact help. Discussed alternative antidepressants if  necessary.  Refer for counseling to Dr. Rica Mote. For depression and chronic self sabotage and marital conflict. He agrees to this treatment plan.  After Visit Summary for patient specific instructions  FU 3 months  Lynder Parents, MD, DFAPA   Future Appointments  Date Time Provider Bruce  10/11/2019  8:15 AM Evelina Bucy, DPM [facility]  10/15/2019 11:00 AM Blanchie Serve, PhD [facility] None  10/17/2019  7:30 AM Hayden Pedro, MD [facility] None  10/25/2019  3:30 PM Dohmeier, Asencion Partridge, MD [facility] None  10/29/2019  11:00 AM Blanchie Serve, PhD [facility] None  01/09/2020  9:00 AM Cottle, Billey Co., MD [facility] None    No orders of the defined types were placed in this encounter.   -------------------------------

## 2019-10-11 ENCOUNTER — Other Ambulatory Visit: Payer: Self-pay

## 2019-10-11 ENCOUNTER — Ambulatory Visit (INDEPENDENT_AMBULATORY_CARE_PROVIDER_SITE_OTHER): Payer: Medicare Other | Admitting: Podiatry

## 2019-10-11 DIAGNOSIS — E119 Type 2 diabetes mellitus without complications: Secondary | ICD-10-CM

## 2019-10-11 DIAGNOSIS — B351 Tinea unguium: Secondary | ICD-10-CM

## 2019-10-11 DIAGNOSIS — M7751 Other enthesopathy of right foot: Secondary | ICD-10-CM | POA: Diagnosis not present

## 2019-10-11 NOTE — Progress Notes (Signed)
Subjective:  Patient ID: Jermaine Waters, male    DOB: [birth year],  MRN: [id number]  Chief Complaint  Patient presents with  . Diabetes Mellitus    1 year DM foot exam. Pt states right foot lateral aspect proximal 5th has some discomfort on the skin when walking/wearing shoes.     73 y.o. male presents  for diabetic foot care. Last AMBS was 122. Denies numbness and tingling in their feet. Denies cramping in legs and thighs.  Review of Systems: Negative except as noted in the HPI. Denies N/V/F/Ch.  Past Medical History:  Diagnosis Date  . Anxiety   . Arthritis   . Cancer ([place])    skin CA removed  . Depression   . Diabetes mellitus   . Diabetes mellitus without complication (HCC)    Type 1  . Elevated PSA 11/2014   PCP ordered for patient to take Cipro for 21 days  . Erectile dysfunction   . Hypercholesteremia   . Skin cancer of face   . Sleep apnea    wears CPAP nightly    Current Outpatient Medications:  .  amLODipine (NORVASC) 2.5 MG tablet, TK 1 T PO QD, Disp: , Rfl: 12 .  buPROPion (WELLBUTRIN XL) 150 MG 24 hr tablet, Take 3 tablets (450 mg total) by mouth daily., Disp: 270 tablet, Rfl: 0 .  CONTOUR NEXT TEST test strip, , Disp: , Rfl:  .  methylphenidate (RITALIN) 10 MG tablet, Take 10 mg by mouth 4 (four) times daily., Disp: , Rfl:  .  NOVOLOG 100 UNIT/ML injection, U IN INSULIN PUMP .APPROXIMATELY 80 UNITS A DAY, Disp: , Rfl: 6 .  pravastatin (PRAVACHOL) 20 MG tablet, TK 1 T PO QD, Disp: , Rfl:  .  PROLENSA 0.07 % SOLN, INT 1 GTT IN OS HS, Disp: , Rfl: 6 .  ramipril (ALTACE) 5 MG capsule, Take 5 mg by mouth 2 (two) times daily. , Disp: , Rfl:  .  SHINGRIX injection, , Disp: , Rfl: 0 .  sildenafil (REVATIO) 20 MG tablet, Take 20 mg by mouth as needed (take 2-5 tablets as needed for erections)., Disp: , Rfl:   Social History   Tobacco Use  Smoking Status Former Smoker  . Packs/day: 2.00  . Years: 15.00  . Pack years: 30.00  . Types: Cigarettes  . Quit date:  70  . Years since quitting: 39.8  Smokeless Tobacco Never Used    No Known Allergies Objective:  There were no vitals filed for this visit. There is no height or weight on file to calculate BMI. Constitutional Well developed. Well nourished.  Vascular Dorsalis pedis pulses present 2+ bilaterally  Posterior tibial pulses present 1+ bilaterally  Pedal hair growth absent. Capillary refill normal to all digits.  No cyanosis or clubbing noted. Varicosities bilat  Neurologic Normal speech. Oriented to person, place, and time. Epicritic sensation to light touch grossly present bilaterally. Protective sensation with 5.07 monofilament  present bilaterally. Vibratory sensation present bilaterally.  Dermatologic Nails elongated, thickened, dystrophic. No open wounds. No skin lesions.  Orthopedic: Normal joint ROM without pain or crepitus bilaterally. No visible deformities. No bony tenderness.   Assessment:   1. Onychomycosis   2. Encounter for diabetic foot exam ([place])   3. Capsulitis of metatarsophalangeal (MTP) joint of right foot    Plan:  Patient was evaluated and treated and all questions answered.  Diabetes without complication, Onychomycosis -Educated on diabetic footcare. Diabetic risk level 0  Callus Right Foot -Palliatively Debrided  Onychomycosis  R hallux -Nail gently debrided of fungal nail Return in about 1 year (around 10/10/2020) for Annual DM exam .

## 2019-10-11 NOTE — Patient Instructions (Signed)
Diabetes Mellitus and Foot Care Foot care is an important part of your health, especially when you have diabetes. Diabetes may cause you to have problems because of poor blood flow (circulation) to your feet and legs, which can cause your skin to:  Become thinner and drier.  Break more easily.  Heal more slowly.  Peel and crack. You may also have nerve damage (neuropathy) in your legs and feet, causing decreased feeling in them. This means that you may not notice minor injuries to your feet that could lead to more serious problems. Noticing and addressing any potential problems early is the best way to prevent future foot problems. How to care for your feet Foot hygiene  Wash your feet daily with warm water and mild soap. Do not use hot water. Then, pat your feet and the areas between your toes until they are completely dry. Do not soak your feet as this can dry your skin.  Trim your toenails straight across. Do not dig under them or around the cuticle. File the edges of your nails with an emery board or nail file.  Apply a moisturizing lotion or petroleum jelly to the skin on your feet and to dry, brittle toenails. Use lotion that does not contain alcohol and is unscented. Do not apply lotion between your toes. Shoes and socks  Wear clean socks or stockings every day. Make sure they are not too tight. Do not wear knee-high stockings since they may decrease blood flow to your legs.  Wear shoes that fit properly and have enough cushioning. Always look in your shoes before you put them on to be sure there are no objects inside.  To break in new shoes, wear them for just a few hours a day. This prevents injuries on your feet. Wounds, scrapes, corns, and calluses  Check your feet daily for blisters, cuts, bruises, sores, and redness. If you cannot see the bottom of your feet, use a mirror or ask someone for help.  Do not cut corns or calluses or try to remove them with medicine.  If you  find a minor scrape, cut, or break in the skin on your feet, keep it and the skin around it clean and dry. You may clean these areas with mild soap and water. Do not clean the area with peroxide, alcohol, or iodine.  If you have a wound, scrape, corn, or callus on your foot, look at it several times a day to make sure it is healing and not infected. Check for: ? Redness, swelling, or pain. ? Fluid or blood. ? Warmth. ? Pus or a bad smell. General instructions  Do not cross your legs. This may decrease blood flow to your feet.  Do not use heating pads or hot water bottles on your feet. They may burn your skin. If you have lost feeling in your feet or legs, you may not know this is happening until it is too late.  Protect your feet from hot and cold by wearing shoes, such as at the beach or on hot pavement.  Schedule a complete foot exam at least once a year (annually) or more often if you have foot problems. If you have foot problems, report any cuts, sores, or bruises to your health care provider immediately. Contact a health care provider if:  You have a medical condition that increases your risk of infection and you have any cuts, sores, or bruises on your feet.  You have an injury that is not   healing.  You have redness on your legs or feet.  You feel burning or tingling in your legs or feet.  You have pain or cramps in your legs and feet.  Your legs or feet are numb.  Your feet always feel cold.  You have pain around a toenail. Get help right away if:  You have a wound, scrape, corn, or callus on your foot and: ? You have pain, swelling, or redness that gets worse. ? You have fluid or blood coming from the wound, scrape, corn, or callus. ? Your wound, scrape, corn, or callus feels warm to the touch. ? You have pus or a bad smell coming from the wound, scrape, corn, or callus. ? You have a fever. ? You have a red line going up your leg. Summary  Check your feet every day  for cuts, sores, red spots, swelling, and blisters.  Moisturize feet and legs daily.  Wear shoes that fit properly and have enough cushioning.  If you have foot problems, report any cuts, sores, or bruises to your health care provider immediately.  Schedule a complete foot exam at least once a year (annually) or more often if you have foot problems. This information is not intended to replace advice given to you by your health care provider. Make sure you discuss any questions you have with your health care provider. Document Released: 11/19/2000 Document Revised: 01/04/2018 Document Reviewed: 12/24/2016 [employer]vier Patient Education  2020 [employer]  

## 2019-10-15 ENCOUNTER — Ambulatory Visit: Payer: Federal, State, Local not specified - PPO | Admitting: Psychiatry

## 2019-10-17 ENCOUNTER — Encounter (INDEPENDENT_AMBULATORY_CARE_PROVIDER_SITE_OTHER): Payer: Medicare Other | Admitting: Ophthalmology

## 2019-10-17 DIAGNOSIS — E113591 Type 2 diabetes mellitus with proliferative diabetic retinopathy without macular edema, right eye: Secondary | ICD-10-CM

## 2019-10-17 DIAGNOSIS — H43813 Vitreous degeneration, bilateral: Secondary | ICD-10-CM

## 2019-10-17 DIAGNOSIS — H34812 Central retinal vein occlusion, left eye, with macular edema: Secondary | ICD-10-CM | POA: Diagnosis not present

## 2019-10-17 DIAGNOSIS — E10319 Type 1 diabetes mellitus with unspecified diabetic retinopathy without macular edema: Secondary | ICD-10-CM | POA: Diagnosis not present

## 2019-10-17 DIAGNOSIS — E113512 Type 2 diabetes mellitus with proliferative diabetic retinopathy with macular edema, left eye: Secondary | ICD-10-CM | POA: Diagnosis not present

## 2019-10-17 DIAGNOSIS — I10 Essential (primary) hypertension: Secondary | ICD-10-CM | POA: Diagnosis not present

## 2019-10-17 DIAGNOSIS — Z4681 Encounter for fitting and adjustment of insulin pump: Secondary | ICD-10-CM | POA: Diagnosis not present

## 2019-10-17 DIAGNOSIS — E11311 Type 2 diabetes mellitus with unspecified diabetic retinopathy with macular edema: Secondary | ICD-10-CM

## 2019-10-17 DIAGNOSIS — Z794 Long term (current) use of insulin: Secondary | ICD-10-CM | POA: Diagnosis not present

## 2019-10-25 ENCOUNTER — Encounter: Payer: Self-pay | Admitting: Neurology

## 2019-10-25 ENCOUNTER — Other Ambulatory Visit: Payer: Self-pay

## 2019-10-25 ENCOUNTER — Ambulatory Visit (INDEPENDENT_AMBULATORY_CARE_PROVIDER_SITE_OTHER): Payer: Medicare Other | Admitting: Neurology

## 2019-10-25 VITALS — BP 113/59 | HR 75 | Temp 97.7°F | Ht 65.0 in | Wt 156.0 lb

## 2019-10-25 DIAGNOSIS — E109 Type 1 diabetes mellitus without complications: Secondary | ICD-10-CM | POA: Diagnosis not present

## 2019-10-25 DIAGNOSIS — J342 Deviated nasal septum: Secondary | ICD-10-CM

## 2019-10-25 DIAGNOSIS — G4731 Primary central sleep apnea: Secondary | ICD-10-CM | POA: Diagnosis not present

## 2019-10-25 NOTE — Progress Notes (Signed)
SLEEP MEDICINE CLINIC   Provider:  Larey Seat, M D  Primary Care Physician:  Leanna Battles (Inactive)   Referring Provider: Dr. Alfredo Bach    Chief Complaint  Patient presents with  . Follow-up    pt alone, rm 11. pt states things are going well with machine. DME [employer].      Jermaine Waters is a 73 year old Caucasian, right-handed male , seen here in a RV on 10-25-2019, [name] has followed [place] for a little over 2 years now after he transferred to CPAP care.  See below for his baseline study from 2012 under Dr. Baird Lyons.  In the meantime he has switched to an auto titration device with a minimum pressure of 7 and a maximum pressure of 17 cmH2O and 2 cm expiratory pressure relief.  His residual AHI is 1.7 the 95th percentile pressure is 11.2 and well within the current pressure window.  He has used the machine 30 out of 30 days and 97% of the time over 4 hours with an average of 6 hours 56 minutes.  He does not have central apnea or Cheyne-Stokes respirations.  The last 2 visits in our office were through nurse practitioner Cecille Rubin who has meanwhile retired.  The patient's BMI is 25, and  he has normal blood pressure, a normal regular heart rate. He sleeps a little under 7 hours. He doesn't nap. He has no nocturia and is no longer snoring using a chin strap.     HPI:  [name] had several sleep studies in his lifetime, and he seek to originally help because he fell asleep at a stoplight.  He was immediately diagnosed with obstructive sleep apnea once tested and has been on CPAP for at least 2 decades. [name] last sleep study took place in 2012 on July 26 and was interpreted by Dr. Baird Lyons.  At the time he had endorsed the Epworth sleepiness score at 18 out of 24 points, his BMI was 28, and he was diagnosed with severe obstructive and central sleep apnea at an AHI of 48.6/hr.   He was also moderately loud snoring oxygen saturation dipped to a nadir of 80%  SPO2.  He was titrated to 7 cmH2O on CPAP wearing a Mirage fullface mask with heated humidification, central apneas were emerging and the residual AHI was still 8.1/h of sleep but snoring was eliminated and oxygen saturation improved.  He also had a moderate degree of periodic limb movements 4.4 arousals per hour. [name] has been a highly compliant CPAP user and over the last 5 years may have missed 1 night, but his wife still reports that he has limb movements and seems to run in his sleep.  I was able to obtain a download.  The patient uses an AutoSet between 8 and 15 cmH2O was 2 cm expiratory pressure relief, is 100% compliant, average daily use is 6 hours and 40 minutes, for 1 week in mid January he used a new facemask and only in that week had significant air leaks.  The 95th percentile pressure for this patient is 14.3 his residual AHI for this months was still 8.2/h most of these apneas according to the CPAP download were obstructive and not central in nature. Obstructive apneas respond to higher pressures, white central apneas are reduced with reduction in pressure.  Also his sleep study placed 6-1/2 years ago, he has a current CPAP machine that is only about 71 years old.  He needs to  continue getting supplies for changing or transferring sleep care means that we need to obtain the patient's original sleep test and pretest office notes.  Sleep habits are as follows:  The patient aims for a bedtime between 1030 and 11 PM, and once in bed is promptly asleep.  Bedroom is described as cool, quiet and dark.  He shares the bed and bedroom with his wife, the patient sleeps on his back on one pillow in a non-adjustable bed.He does not have nocturia every night, he does no longer snore since he wears a chin strap now that he was fitted with a nasal pillow- he has a beard. He is not a dream a or at least does not recall dreaming, and reports no sleepwalking episodes of parasomnia. Since retirement he may  sleep a little longer than he used to -used to rise at 6 AM, now between 6.30 and 7 AM. After a good night sleep he feels " fully charged and ready to go "-he feels best after at least 7 hours of nocturnal sleep. He does not take naps, as these have left him more groggy and wishing for more sleep rather than being power naps.  Sleep medical history and family sleep history: [name] parents were to his knowledge not by any sleep dysfunction, his siblings neither. There is no history of childhood sleep disorders, enuresis, terrors etc.   The patient's past medical history :  he has been diagnosed with diabetes type 1, control has been at times poorly, has developed diabetic neuropathy, retinopathy, carpal tunnel syndrome, ADD and depression, hyperlipidemia, and Pyrenees disease.  And atypical chest pain in 2006 was worked up to be of cardiac origin.  Depression has been well controlled on Zoloft, he has undergone cataract surgery left eye Avastin injections and laser treatments of both eyes for diabetic retinopathy. Further surgical treatments include a cholecystectomy and appendectomy in 1980, rotator cuff repair on the right in 2014, septoplasty nasi in 2016, various laser treatments throughout the years 2013 and 2016 as well as cataracts for the left eye 2018 and right eye 2018 about , 6 month apart.   Social history: married , daughter is 15 and son is 47.  The patient smoked for about 15 years until he quit at age 88. No tobacco use since, he quit smoking 5 years before he quit drinking alcohol.  He drinks about 6 cups of coffee a day, no energy drinks, no sodas and no iced tea or hot tea. He has no shift work history.  He likes to sing and bicycles.  Review of Systems: Out of a complete 14 system review, the patient complains of only the following symptoms, and all other reviewed systems are negative.  PLMs but no arousals, no EDS, Not fatigued.  He endorsed snoring, feeling cold, a tendency to  bruise easily, dry skin with itching, and some restless legs at night.  How likely are you to doze in the following situations: 0 = not likely, 1 = slight chance, 2 = moderate chance, 3 = high chance  Sitting and Reading? 1 Watching Television? 1 Sitting inactive in a public place (theater or meeting)? Lying down in the afternoon when circumstances permit? Sitting and talking to someone? Sitting quietly after lunch without alcohol?2 In a car, while stopped for a few minutes in traffic?1 As a passenger in a car for an hour without a break? 2  Total = 7  Fatigue severity score 17/ 63   , geriatric depression  score 7/ 15   60 years on insulin (!)  DM 1.  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 Marital status: Married    Spouse name: Not on file  . Number of children: 2  . Years of education: Not on file  . Highest education level: Not on file  Occupational History  . Occupation: Retired    Fish farm manager: [employer]  Social Needs  . Financial resource strain: Not on file  . Food insecurity    Worry: Not on file    Inability: Not on file  . Transportation needs    Medical: Not on file    Non-medical: Not on file  Tobacco Use  . Smoking status: Former Smoker    Packs/day: 2.00    Years: 15.00    Pack years: 30.00    Types: Cigarettes    Quit date: 1981    Years since quitting: 39.9  . Smokeless tobacco: Never Used  Substance and Sexual Activity  . Alcohol use: No    Comment: 1986  . Drug use: No  . Sexual activity: Not on file  Lifestyle  . Physical activity    Days per week: Not on file    Minutes per session: Not on file  . Stress: Not on file  Relationships  . Social Herbalist on phone: Not on file    Gets together: Not on file    Attends religious service: Not on file    Active member of club or organization: Not on file    Attends meetings of clubs or organizations: Not on file     Relationship status: Not on file  . Intimate partner violence    Fear of current or ex partner: Not on file    Emotionally abused: Not on file    Physically abused: Not on file    Forced sexual activity: Not on file  Other Topics Concern  . Not on file  Social History Narrative    Merged History Encounter         Family History  Problem Relation Age of Onset  . Cancer Father        Esophageal/gastric cancer  . Cancer Mother        Metastatic cancer  . Rheum arthritis Mother   . Colon cancer Neg Hx   . Pancreatic cancer Neg Hx   . Stomach cancer Neg Hx     Past Medical History:  Diagnosis Date  . Anxiety   . Arthritis   . Cancer ([place])    skin CA removed  . Depression   . Diabetes mellitus   . Diabetes mellitus without complication (HCC)    Type 1  . Elevated PSA 11/2014   PCP ordered for patient to take Cipro for 21 days  . Erectile dysfunction   . Hypercholesteremia   . Skin cancer of face   . Sleep apnea [facility], Dr. Brett Fairy.     wears CPAP nightly- autotitration 7- 17 cm water, EPR of 2.     Past Surgical History:  Procedure Laterality Date  . APPENDECTOMY    . CHOLECYSTECTOMY    . COLONOSCOPY    . EYE SURGERY Bilateral    laser  . ROTATOR CUFF REPAIR Right   . SEPTOPLASTY Bilateral 12/11/2014   Procedure: NASAL BILATERAL SEPTOPLASTY;  Surgeon: Jerrell Belfast, MD;  Location: [facility];  Service: ENT;  Laterality: Bilateral;  .  SHOULDER ARTHROSCOPY WITH ROTATOR CUFF REPAIR AND SUBACROMIAL DECOMPRESSION Right 04/05/2013   Procedure: RIGHT SHOULDER ARTHROSCOPY SUBACROMIAL DECOMPRESSION DISTAL CLAVICLE RESECTION AND ROTATOR CUFF REPAIR ;  Surgeon: Marin Shutter, MD;  Location: [facility];  Service: Orthopedics;  Laterality: Right;  . TONSILLECTOMY    . TURBINATE REDUCTION N/A 12/11/2014   Procedure: INFERIOR TURBINATE REDUCTION;  Surgeon: Jerrell Belfast, MD;  Location: [facility];  Service: ENT;  Laterality: N/A;    Current Outpatient Medications  Medication Sig  Dispense Refill  . amLODipine (NORVASC) 2.5 MG tablet TK 1 T PO QD  12  . buPROPion (WELLBUTRIN XL) 150 MG 24 hr tablet Take 3 tablets (450 mg total) by mouth daily. 270 tablet 0  . CONTOUR NEXT TEST test strip     . methylphenidate (RITALIN) 10 MG tablet Take 10 mg by mouth 4 (four) times daily.    Marland Kitchen NOVOLOG 100 UNIT/ML injection U IN INSULIN PUMP .APPROXIMATELY 80 UNITS A DAY  6  . pravastatin (PRAVACHOL) 20 MG tablet TK 1 T PO QD    . PROLENSA 0.07 % SOLN INT 1 GTT IN OS HS  6  . ramipril (ALTACE) 5 MG capsule Take 5 mg by mouth 2 (two) times daily.     Marland Kitchen SHINGRIX injection   0  . sildenafil (REVATIO) 20 MG tablet Take 20 mg by mouth as needed (take 2-5 tablets as needed for erections).     No current facility-administered medications for this visit.     Allergies as of 10/25/2019  . (No Known Allergies)    Vitals: BP (!) 113/59   Pulse 75   Temp 97.7 F (36.5 C)   Ht 5\' 5"  (1.651 m)   Wt 156 lb (70.8 kg)   BMI 25.96 kg/m  Last Weight:  Wt Readings from Last 1 Encounters:  10/25/19 156 lb (70.8 kg)   [id number] mass index is 25.96 kg/m.     Last Height:   Ht Readings from Last 1 Encounters:  10/25/19 5\' 5"  (1.651 m)    Physical exam:  General: The patient is awake, alert and appears not in acute distress. The patient is well groomed. Facial hair- mustache. Head: Normocephalic, atraumatic. Neck is supple. Mallampati 3- partial uvula lift. ,  neck circumference: 15.5 . Nasal airflow patent , TMJ is  not evident.  Retrognathia is not seen.  Cardiovascular:  Regular rate and rhythm , without  murmurs or carotid bruit, and without distended neck veins. Respiratory: Lungs are clear to auscultation. Skin:  Without evidence of edema, or rash Trunk: BMI is 26. The patient's posture is normal.  Neurologic exam : The patient is awake and alert, oriented to place and time.   Attention span & concentration ability appears normal during this visit.Marland Kitchen  Speech is fluent,  without  dysarthria, dysphonia or aphasia.  Mood and affect are appropriate.  Cranial nerves: Intact sense of smell and taste. Pupils are sluggishly  reactive to light. Funduscopic exam deferred,  scars-  Extraocular movements  in vertical and horizontal planes intact and without nystagmus. Visual fields by finger perimetry are intact. Hearing to finger rub intact. Facial sensation intact to fine touch.Facial motor strength is symmetric and tongue and uvula move midline. Shoulder shrug was symmetrical.  Motor exam:  Normal tone, muscle bulk and symmetric strength in all extremities. Sensory:  Fine touch,  vibration were  proximally intact. Proprioception tested in the upper extremities was normal. Coordination: Rapid alternating movements in the fingers/hands was normal. Finger-to-nose maneuver  normal without evidence of ataxia, dysmetria or tremor. Gait and station: Patient walks without assistive device .Deep tendon reflexes: in the  upper and lower extremities are symmetric / intact.    Assessment:  After physical and neurologic examination, review of laboratory studies,  Personal review of imaging studies, reports of other /same  Imaging studies, results of polysomnography and / or neurophysiology testing and pre-existing records as far as provided in visit., my assessment is   1) Mr. Ruedas has carried a diagnosis of obstructive sleep apnea for almost 2 decades, his last sleep study at [facility] in the year 2012 however spoke of a complex sleep apnea with central apneas arising under CPAP treatment.   He has always slept better with CPAP than without, he has made a significant difference in fatigue, excessive daytime sleepiness the quality of sleep overall. Transferring sleep care to our office is certainly possible, his durable medical equipment company is [employer].  Current new CPAP machine works well pressure on his current AutoSet beginning at 7 cmH2O minimum pressure and maximum pressure to be  elevated to 17 cmH2O, with 2 cm EPR.  His compliance is excellent.  I would like to see the patient in 12 months after these changes have been implemented.     The patient was advised of the nature of the diagnosed disorder , the treatment options and the  risks for general health and wellness arising from not treating the condition.   I spent more than 15 minutes of face to face time with the patient.  Greater than 50% of time was spent in counseling and coordination of care. We have discussed the diagnosis and differential and I answered the patient's questions.   He asked about hypoglossal CN stimulator, INSPIRE procedure - He has not been evaluated for baseline apnea type and grade since 2012.  If he decides to follow up for INSPIRE, we will need to order an attended sleep study first, then , based on the results, make the referral to ENT.    Larey Seat, MD [id number], 0000000 PM  Certified in Neurology by ABPN Certified in [place] by [facility] [address] [place], [place] [address]

## 2019-10-25 NOTE — Patient Instructions (Signed)

## 2019-10-29 ENCOUNTER — Other Ambulatory Visit: Payer: Self-pay

## 2019-10-29 ENCOUNTER — Ambulatory Visit (INDEPENDENT_AMBULATORY_CARE_PROVIDER_SITE_OTHER): Payer: Medicare Other | Admitting: Psychiatry

## 2019-10-29 DIAGNOSIS — F341 Dysthymic disorder: Secondary | ICD-10-CM | POA: Diagnosis not present

## 2019-10-29 DIAGNOSIS — F4322 Adjustment disorder with anxiety: Secondary | ICD-10-CM | POA: Diagnosis not present

## 2019-10-29 DIAGNOSIS — Z630 Problems in relationship with spouse or partner: Secondary | ICD-10-CM | POA: Diagnosis not present

## 2019-10-29 NOTE — Progress Notes (Signed)
Psychotherapy Progress Note [facility] [name], PhD LP  Patient ID: Jermaine Waters     MRN: [id number]     Therapy format: Individual psychotherapy Date: 10/29/2019     Start: 11:15a Stop: 12:05p Time Spent: 50 min Location: in-person   Session narrative (presenting needs, interim history, self-report of stressors and symptoms, applications of prior therapy, status changes, and interventions made in session) Continued discomfort in relationship with W -- his part for avoiding conflict, her part for being hypervocal and trying too hard to own his and other people's problems.  Noted her the other night dominating a Zoom call with others, felt aversion to even going in the room.  Support/empathy provided.   Glad to see himself more industrious for better dosage Wellbutrin.  Has engaged Jermaine Waters a bit about her habit of disparaging him, but difficult to get traction to have that part of the conversation.  Encouraged to refocus to ask if she is open to hearing his feelings, viewpoint before trying to broach it.  Notes he did confront negativity and how much more so it happens when she's been drinking.  Some example in son Jermaine Waters, confronting his sister Jermaine Waters about her own drinking.  Knows Jermaine Waters is about family, so it rings with her if her behavior is framed as frustrating that, and sees her reining it in.  Able to see, tentatively, that he was effective in telling her she had behaved abrasively, now the hard part sticking with realistic mix of good news/bad news he is found courageous and true.  Re. health, has good reviews lately on his eyes, blood sugar management, CPAP use, and podiatry.    Notes that he felt subtly but strongly empowered of late.  Discussed outlook for next conflict, how he wants to handle it.  Continues ACOA involvement.  Affirmed/encouraged.  Therapeutic modalities: Cognitive Behavioral Therapy, Assertiveness/Communication and  Solution-Oriented/Positive Psychology  Mental Status/Observations:  Appearance:   Casual     Behavior:  Appropriate  Motor:  Normal  Speech/Language:   Clear and Coherent  Affect:  Appropriate  Mood:  anxious  Thought process:  normal and some wory  Thought content:    WNL  Sensory/Perceptual disturbances:    WNL  Orientation:  Fully oriented  Attention:  Good  Concentration:  Fair  Memory:  WNL  Insight:    Good  Judgment:   Good  Impulse Control:  Good   Risk Assessment: Danger to Self: No Self-injurious Behavior: No Danger to Others: No Physical Aggression / Violence: No Duty to Warn: No Access to Firearms a concern: No  Assessment of progress:  progressing  Diagnosis:   ICD-10-CM   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 Adjustment disorder with anxious mood  F43.22   3. Marital conflict  0000000     Plan:  . Catch and redirect conflict avoidance, try to speak truths, but content that respect is in saying it how you would want to hear it, not in how it gets taken . Continue ACOA as desired . Other recommendations/advice as noted above . Continue to utilize previously learned skills ad lib . Maintain medication as prescribed and work faithfully with relevant prescriber(s) if any changes are desired or seem indicated . Call the clinic on-call service, present to ER, or call 911 if any life-threatening psychiatric crisis Return in about 3 weeks (around 11/19/2019). Current [facility] appointments: Future Appointments  Date Time Provider [place]  12/13/2019  7:30 AM Tempie Hoist  D, MD [facility] None  01/09/2020  9:00 AM Cottle, Billey Co., MD [facility] None  10/09/2020  8:15 AM March Rummage, Christian Mate, DPM [facility]    Blanchie Serve, PhD [name], PhD LP Clinical Psychologist, [place] [facility] [address] [place], [place] [address] [phone]

## 2019-11-06 ENCOUNTER — Other Ambulatory Visit (HOSPITAL_COMMUNITY): Payer: Self-pay | Admitting: Internal Medicine

## 2019-11-06 ENCOUNTER — Other Ambulatory Visit: Payer: Self-pay | Admitting: Internal Medicine

## 2019-11-06 DIAGNOSIS — H3581 Retinal edema: Secondary | ICD-10-CM | POA: Diagnosis not present

## 2019-11-06 DIAGNOSIS — G4485 Primary stabbing headache: Secondary | ICD-10-CM | POA: Diagnosis not present

## 2019-11-06 DIAGNOSIS — I10 Essential (primary) hypertension: Secondary | ICD-10-CM | POA: Diagnosis not present

## 2019-11-09 ENCOUNTER — Other Ambulatory Visit (HOSPITAL_COMMUNITY): Payer: Self-pay | Admitting: Internal Medicine

## 2019-11-09 DIAGNOSIS — H539 Unspecified visual disturbance: Secondary | ICD-10-CM

## 2019-11-09 DIAGNOSIS — R42 Dizziness and giddiness: Secondary | ICD-10-CM

## 2019-11-12 ENCOUNTER — Ambulatory Visit (HOSPITAL_COMMUNITY)
Admission: RE | Admit: 2019-11-12 | Discharge: 2019-11-12 | Disposition: A | Discharge status: Home / Self Care | Payer: Medicare Other | Source: Ambulatory Visit | Attending: Family | Admitting: Family

## 2019-11-12 ENCOUNTER — Other Ambulatory Visit: Payer: Self-pay

## 2019-11-12 DIAGNOSIS — H539 Unspecified visual disturbance: Secondary | ICD-10-CM

## 2019-11-12 DIAGNOSIS — R42 Dizziness and giddiness: Secondary | ICD-10-CM

## 2019-11-12 DIAGNOSIS — H3581 Retinal edema: Secondary | ICD-10-CM | POA: Insufficient documentation

## 2019-11-13 ENCOUNTER — Ambulatory Visit (HOSPITAL_COMMUNITY)
Admission: RE | Admit: 2019-11-13 | Discharge: 2019-11-13 | Disposition: A | Discharge status: Home / Self Care | Payer: Medicare Other | Source: Ambulatory Visit | Attending: Internal Medicine | Admitting: Internal Medicine

## 2019-11-13 DIAGNOSIS — H3581 Retinal edema: Secondary | ICD-10-CM

## 2019-11-13 DIAGNOSIS — R42 Dizziness and giddiness: Secondary | ICD-10-CM | POA: Diagnosis not present

## 2019-11-13 DIAGNOSIS — H539 Unspecified visual disturbance: Secondary | ICD-10-CM | POA: Diagnosis not present

## 2019-11-13 MED ORDER — GADOBUTROL 1 MMOL/ML IV SOLN
10.0000 mL | Freq: Once | INTRAVENOUS | Status: AC | PRN
  Administered 2019-11-13: 7 mL via INTRAVENOUS

## 2019-11-27 ENCOUNTER — Encounter: Payer: Self-pay | Admitting: Neurology

## 2019-11-27 ENCOUNTER — Other Ambulatory Visit: Payer: Self-pay | Admitting: Neurology

## 2019-11-27 DIAGNOSIS — G4733 Obstructive sleep apnea (adult) (pediatric): Secondary | ICD-10-CM

## 2019-11-27 DIAGNOSIS — Z9989 Dependence on other enabling machines and devices: Secondary | ICD-10-CM

## 2019-12-04 ENCOUNTER — Ambulatory Visit (INDEPENDENT_AMBULATORY_CARE_PROVIDER_SITE_OTHER): Payer: Medicare Other | Admitting: Psychiatry

## 2019-12-04 DIAGNOSIS — F341 Dysthymic disorder: Secondary | ICD-10-CM

## 2019-12-04 DIAGNOSIS — F331 Major depressive disorder, recurrent, moderate: Secondary | ICD-10-CM

## 2019-12-04 DIAGNOSIS — Z630 Problems in relationship with spouse or partner: Secondary | ICD-10-CM

## 2019-12-04 NOTE — Progress Notes (Signed)
Psychotherapy Progress Note [facility] [name], PhD LP  Patient ID: Jermaine Waters     MRN: [id number]     Therapy format: Individual psychotherapy Date: 12/04/2019      Start: 3:22p     Stop: 4:11p     Time Spent: 49 min Location: Telehealth visit -- I connected with this patient by an approved telecommunication method (video), with his informed consent, and verifying identity and patient privacy.  I was located at my home and patient at his home.  As needed, we discussed the limitations, risks, and security and privacy concerns associated with telehealth service, including the availability and conditions which currently govern in-person appointments and the possibility that 3rd-party payment may not be fully guaranteed and he may be responsible for charges.  After he indicated understanding, we proceeded with the session.  Also discussed treatment planning, as needed, including ongoing verbal agreement with the plan, the opportunity to ask and answer all questions, his demonstrated understanding of instructions, and his readiness to call the office should symptoms worsen or he feels he is in a crisis state and needs more immediate and tangible assistance.   Session narrative (presenting needs, interim history, self-report of stressors and symptoms, applications of prior therapy, status changes, and interventions made in session) Last 5 weeks, sees another level of anger and frustration from [place], who is dogging him about forgetting things and rehearsing how "hurtful" it is.  Knows he is often enough in fight-flight mode about the next criticism, and therefore would have that much harder time hearing, comprehending, and following through.  Wife now saying she thinks Wellbutrin has turned him into a "bump on  Log", but clear that discouragement will do the same.  Recognizes how much just a little encouragement can do to lift him up, having received some from a church friend.   Has taken the stimulus to start doing some priority-setting for the day.  Now she is even telling him she has lost confidence in his ability/dedication to manage their future, starting to talk about the two of them getting jobs.  She is repeatedly characterizing him as foolish, and any day can be critical; historically she goes through "rage sessions" every few months that results in [place], then after quiet period W will leave a note of apology and explanation how something he did "triggered" her.    Interpreted some of wife's complaints as her own mood trying to find, or make, company, that she must be rather miserable to start with, offering the distinct possibility that her putdowns are a primitive bid for empathy, while trying to preserve the perceived safety of non-intimacy.  Discussed tactics for challenging her habits of harping and making pronouncements, including being ready to let her know how something is coming across with the grace of asking if she meant it that way.  Also possibility of telling her the demoralizing effect on him and asking if she is trying to say she feels that way herself.  Where possible, can remind her that, even if it is him talking, anybody would want to know more about when he is pleasing her -- is there anything she would like to give credit for, or anything that is "working"?  Re. financial dealings, offered that he could suggest they get a professional opinion, especially if it part of current service with bank or investments, and that professionalizing the conversation could either tone down her doubt or help him realize something if truly needed.  Therapeutic modalities: Cognitive Behavioral Therapy and Solution-Oriented/Positive Psychology  Mental Status/Observations:  Appearance:   Casual     Behavior:  Appropriate  Motor:  Normal  Speech/Language:   Clear and Coherent  Affect:  Appropriate and responsive  Mood:  anxious and dysthymic  Thought  process:  normal  Thought content:    WNL  Sensory/Perceptual disturbances:    WNL  Orientation:  Fully oriented  Attention:  Good  Concentration:  Good  Memory:  WNL  Insight:    Good  Judgment:   Good  Impulse Control:  Good   Risk Assessment: Danger to Self: No Self-injurious Behavior: No Danger to Others: No Physical Aggression / Violence: No Duty to Warn: No Access to Firearms a concern: No  Assessment of progress:  progressing  Diagnosis:   ICD-10-CM   1. Major depressive disorder, recurrent episode, moderate (HCC)  F33.1   2. 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 Marital conflict  0000000    Plan:  . Communication tactics for evolving critical conversations with wife . Option to seek professional financial opinion on their assets and prospects . Continue daily priority-setting and self-evaluating daily productivity separately from any reviews wife gives . Other recommendations/advice as noted above . Continue to utilize previously learned skills ad lib . Maintain medication as prescribed and work faithfully with relevant prescriber(s) if any changes are desired or seem indicated . Call the clinic on-call service, present to ER, or call 911 if any life-threatening psychiatric crisis No follow-ups on file. Current [facility] appointments: Future Appointments  Date Time Provider [place]  12/13/2019  7:30 AM Hayden Pedro, MD [facility] None  01/07/2020  9:00 AM Blanchie Serve, PhD [facility] None  01/09/2020  9:00 AM Cottle, Billey Co., MD [facility] None  10/09/2020  8:15 AM Evelina Bucy, DPM [facility]    Blanchie Serve, PhD [name], PhD LP Clinical Psychologist, [facility] [address], [place], [place] [address] [phone]

## 2019-12-11 DIAGNOSIS — R4189 Other symptoms and signs involving cognitive functions and awareness: Secondary | ICD-10-CM | POA: Diagnosis not present

## 2019-12-11 DIAGNOSIS — R413 Other amnesia: Secondary | ICD-10-CM | POA: Diagnosis not present

## 2019-12-11 DIAGNOSIS — G4733 Obstructive sleep apnea (adult) (pediatric): Secondary | ICD-10-CM | POA: Diagnosis not present

## 2019-12-13 ENCOUNTER — Encounter (INDEPENDENT_AMBULATORY_CARE_PROVIDER_SITE_OTHER): Payer: Medicare Other | Admitting: Ophthalmology

## 2019-12-13 DIAGNOSIS — E113591 Type 2 diabetes mellitus with proliferative diabetic retinopathy without macular edema, right eye: Secondary | ICD-10-CM

## 2019-12-13 DIAGNOSIS — E11311 Type 2 diabetes mellitus with unspecified diabetic retinopathy with macular edema: Secondary | ICD-10-CM

## 2019-12-13 DIAGNOSIS — H34812 Central retinal vein occlusion, left eye, with macular edema: Secondary | ICD-10-CM

## 2019-12-13 DIAGNOSIS — H43813 Vitreous degeneration, bilateral: Secondary | ICD-10-CM | POA: Diagnosis not present

## 2019-12-13 DIAGNOSIS — E113512 Type 2 diabetes mellitus with proliferative diabetic retinopathy with macular edema, left eye: Secondary | ICD-10-CM

## 2020-01-03 ENCOUNTER — Other Ambulatory Visit: Payer: Self-pay | Admitting: Psychiatry

## 2020-01-07 ENCOUNTER — Other Ambulatory Visit: Payer: Self-pay

## 2020-01-07 ENCOUNTER — Ambulatory Visit (INDEPENDENT_AMBULATORY_CARE_PROVIDER_SITE_OTHER): Payer: Medicare Other | Admitting: Psychiatry

## 2020-01-07 DIAGNOSIS — E109 Type 1 diabetes mellitus without complications: Secondary | ICD-10-CM | POA: Diagnosis not present

## 2020-01-07 DIAGNOSIS — F4322 Adjustment disorder with anxiety: Secondary | ICD-10-CM

## 2020-01-07 DIAGNOSIS — F341 Dysthymic disorder: Secondary | ICD-10-CM

## 2020-01-07 DIAGNOSIS — F331 Major depressive disorder, recurrent, moderate: Secondary | ICD-10-CM

## 2020-01-07 DIAGNOSIS — Z630 Problems in relationship with spouse or partner: Secondary | ICD-10-CM

## 2020-01-07 NOTE — Progress Notes (Signed)
Psychotherapy Progress Note [facility] [name], PhD LP  Patient ID: Jermaine Waters     MRN: [id number] Therapy format: Individual psychotherapy Date: 01/07/2020      Start: 9:16a     Stop: 10:06a     Time Spent: 50 min Location: In-person   Session narrative (presenting needs, interim history, self-report of stressors and symptoms, applications of prior therapy, status changes, and interventions made in session) Been talking with a man from his church, offers some hope of better.  42nd anniversary trip with W traveling down [place].  Was hopeful of reromanticizing and opening positive expectations, but [name] flipped in the middle, turned icy, and renewed her claim that they ought to separate.  Seems to have followed time looking at possible destinations to relocate, which tapped deep conflict about whether to even be together.  After a lot of skirmishing this weekend, began to make sense to him yesterday that separation would be a relief, personally, and a good idea for both of them, so he began researching therapeutic separation, found helpful guidance online.    Tired of the verbal beatings he gets, says he "froze" (numbed out, was still) for about 15 minutes either Friday or Saturday, which only enraged her, but says it was an automatic response, rooted in childhood experience of getting yelled at.  Once he arose, he came back to explain what happens to him when she says nothing but condemnation, starting in the morning, this numbing out, automatic response, learned in childhood, etc. without frankly apologizing, just an invitation to see the effects of her way of expressing herself.  In the afternoon confronted her about the constant criticism and what results she can't help but expect when she does that.  Also confronted her drinking and how her personality changes as soon as she begins (denied by her but substantial step in assertiveness to bring it up).  Confrontations did  lead to some period of more quiet.  Pilar Plate discussion of her addiction also leads him to guilt that he hasn't done more to address her drinking.  Challenged to realize that he has been trying to work out love's dilemma all along, whether it's better or more loving to pacify or challenge, and that it actually sounds arrogant to credit himself with her drinking problem -- "If you have the power to give people alcoholism, then I think we should stop seeing each other."  Reminded, per his own ACOA awareness, that her addiction is bigger than him.  Addressed resolve to separate, tested the prospect he will feel guilty shortly after he starts feeling the relief he wants living separately -- no, feels more sure he will feel relief and be able to release some anxiety and triggers to unstable blood sugar.  Glucometer going off in session -- has tightened up his notifications in order to exercise greater control. Knows [name] has been on tenterhooks with his diabetes, having been his emergency response whenever b.s. has crashed before, and he sees her flinch every time it goes off and she is within sight.  Re. separating, he does consider it enough, and his fear of being alone no longer outweighs the needs for emotional safety, honesty, and self-determination rather than perceived domination.  Validated, in fact, that it has been extraordinarily lonely already, in the relationship habit they have had, and making their relationship look the way it actually is would be a net relief.  Reviewed practical considerations -- greater expense of living in two homes,  splitting the increased cost, the need for explicit written agreements on who does what, the terms of doing maintenance on the house if he is not living there, responsibility to take care of his own emergency diabetes coverage, separation of assets and bills, the need for explicit coordination and cooperation on things that will still entangle them after moving out  (i.e., doing business with clarity, which has probably eluded them through this period of breakdown)  Also addressed ongoing tendency of Cecilia to direct him, e.g., telling him how to connect with church men, etc., to find living space, directing him to be in therapy weekly or find another therapist who can.  Validated that separation means no more directing, that their own lives are their own business unless and until agreed otherwise, so he can make short work off it by telling her that separating is supposed to mean she doesn't have to carry those things any more and he doesn't have to let her intrude on them, that if she wants him to "grow up," it's time to let him.    Discussed outlook for therapy.  Would be better if he can have more regular contact, so recommended scheduling ahead on a 2-wk basis and reminded of urgent spots in the calendar should they be needed.  Encouraged to make sure he is not too alone with it if he starts to have intense feelings of despair, acute loneliness, or failure.  Therapeutic modalities: Cognitive Behavioral Therapy, Solution-Oriented/Positive Psychology and 12-Step  Mental Status/Observations:  Appearance:   Casual and Neat     Behavior:  Appropriate  Motor:  Normal  Speech/Language:   Clear and Coherent  Affect:  Appropriate  Mood:  dysthymic and more resolute, empowered  Thought process:  normal  Thought content:    WNL  Sensory/Perceptual disturbances:    WNL  Orientation:  Fully oriented  Attention:  Good  Concentration:  Good  Memory:  WNL  Insight:    Good  Judgment:   Good  Impulse Control:  Good   Risk Assessment: Danger to Self: No Self-injurious Behavior: No Danger to Others: No Physical Aggression / Violence: No Duty to Warn: No Access to Firearms a concern: No  Assessment of progress:  progressing well  Diagnosis:   ICD-10-CM   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 Major depressive disorder, recurrent episode, moderate (HCC)   F33.1    improving with assertiveness  3. Marital conflict  0000000   4. Adjustment disorder with anxious mood  F43.22    improving well  5. Diabetes mellitus type 1, controlled, insulin dependent ([place])  E10.9    Plan:  . Al-Anon/ACOA support . Keep touch with supportive male friends . Identify issues that need written agreement . Other recommendations/advice as may be noted above . Return to potential issues -- what if she deteriorates in drinking, what if she turns hostile about finances and possessions, what if she starts seeing the error of being so caustic and wants to reunite quickly, etc. . Continue to utilize previously learned skills ad lib . Maintain medication as prescribed and work faithfully with relevant prescriber(s) if any changes are desired or seem indicated . Call the clinic on-call service, present to ER, or call 911 if any life-threatening psychiatric crisis . Return in about 2 weeks (around 01/21/2020) for recommend scheduling ahead.Marland Kitchen  Next scheduled visit in this office 01/09/2020.  Blanchie Serve, PhD [name], PhD LP Clinical Psychologist, [facility] [address] [place], [place] [address] ([phone]

## 2020-01-09 ENCOUNTER — Other Ambulatory Visit: Payer: Self-pay

## 2020-01-09 ENCOUNTER — Encounter: Payer: Self-pay | Admitting: Psychiatry

## 2020-01-09 ENCOUNTER — Ambulatory Visit (INDEPENDENT_AMBULATORY_CARE_PROVIDER_SITE_OTHER): Payer: Medicare Other | Admitting: Psychiatry

## 2020-01-09 DIAGNOSIS — F341 Dysthymic disorder: Secondary | ICD-10-CM | POA: Diagnosis not present

## 2020-01-09 DIAGNOSIS — G4710 Hypersomnia, unspecified: Secondary | ICD-10-CM

## 2020-01-09 DIAGNOSIS — F331 Major depressive disorder, recurrent, moderate: Secondary | ICD-10-CM

## 2020-01-09 DIAGNOSIS — G4730 Sleep apnea, unspecified: Secondary | ICD-10-CM | POA: Diagnosis not present

## 2020-01-09 NOTE — Progress Notes (Signed)
Jermaine Waters [id number] 05/10/1946 74 y.o.  Subjective:   Patient ID:  Jermaine Waters is a 74 y.o. (DOB November 14, 1946) male.  Chief Complaint:  Chief Complaint  Patient presents with  . Follow-up    Medication management  . Depression    Medication management  . hypersomnolence    Depression        Associated symptoms include fatigue.  Jermaine Waters presents to the office today for follow-up of mood concerns and hypersomnolence.    Patient was seen August 08, 2019.  The assessment was depression.  The following was done: PleasE STOP  bupropion 75 mg. Start bupropion XL 150 mg tablets 1 each morning for 2 weeks. If no side effects such as jitteriness, then increase to 2 tablets each morning which equals 300 mg daily. He continued Ritalin 10 QID.  Patient was last seen October 10, 2019.  He had a partial response with Wellbutrin XL 300 mg daily.  He was tolerating it and therefore the dosage was increased to 450 mg a day. He continued Ritalin 10 mg 4 times daily  No Covid problems.    Didn't notice much difference with increase Wellbutrin.  Wife thinks he's a little more lethargic.  In some ways a little happier but really struggling with wife.  Pending temporary separation for a few months to try to provide some healing.  Hopefully will help both of them.    Wife concerned about Ritalin Rx for alertness DT OSA and central apnea that even affects him while awake.  PT meds could fall asleep driving.  Ritalin helps and CPAP helps.  Helps alerness and no insomnia.  Wonders about SE of it.  A little more optimistic and less tense.  Therapy helpng also. No SE.  Not jitttery nor edgy.  Wife critical that he's less responsive, but maybe I'm less on edge. Energy is fine.  Interests hard to answer bc not a lot of outside activities left DT Covid.  Couple of small groups meeting with Zoom.  Most of free time before was singing but can't do it now.  Things changed and wants meds and  therapy.  PCP started Wellbutrin a couple of weeks ago.  All my life self-sabotage with jobs and has to restart.  Fear of failure.  Some worsening health issues dropping productivity.  Wife frustrated with him and constantly advising and critical.  Marital stress and unhappiness worse bc together all the time.  Pt reports that mood is Anxious, Depressed and Dysphoric and describes anxiety as Moderate. Anxiety symptoms include: Excessive Worry, avoidant of stress or pressure or commitments.. Pt reports no sleep issues and 7 hours but prefers 8 hours. Pt reports that appetite is good. Pt reports that energy is poor and anhedonia, poor motivation and withdrawn from usual activities. Concentration is down slightly. Suicidal thoughts:  denied by patient.  Ritalin 10 QID for hypersomnolence but questions ADD bc is distractible.  Sleep apnea for 20 years on CPAP.  Chronically sleepy.  Past Psychiatric Medication Trials:  History [name].  On and off antidepressants for 20 years  Doesn't remember any being helpful.  Remote Wellbutrin ? Dose.  Doesn't remember the names of prior meds but remembers sexual SE with one. History of Nuvigil for hypersomnolence but he does not recall the response.  Review of Systems:  Review of Systems  Constitutional: Positive for fatigue.  Eyes: Positive for visual disturbance.  Musculoskeletal: Positive for arthralgias.  Neurological: Negative for tremors and weakness.  Psychiatric/Behavioral: Positive  for depression.    Medications: I have reviewed the patient's current medications.  Current Outpatient Medications  Medication Sig Dispense Refill  . amLODipine (NORVASC) 2.5 MG tablet TK 1 T PO QD  12  . buPROPion (WELLBUTRIN XL) 150 MG 24 hr tablet TAKE 3 TABLETS(450 MG) BY MOUTH DAILY 270 tablet 0  . Cinnamon 500 MG capsule Take 500 mg by mouth daily.    . CONTOUR NEXT TEST test strip     . Cyanocobalamin (VITAMIN B 12) 500 MCG TABS Take 2,000 Units by  mouth.    . methylphenidate (RITALIN) 10 MG tablet Take 10 mg by mouth 4 (four) times daily.    Marland Kitchen NOVOLOG 100 UNIT/ML injection U IN INSULIN PUMP .APPROXIMATELY 80 UNITS A DAY  6  . pravastatin (PRAVACHOL) 20 MG tablet TK 1 T PO QD    . PROLENSA 0.07 % SOLN INT 1 GTT IN OS HS  6  . ramipril (ALTACE) 5 MG capsule Take 5 mg by mouth 2 (two) times daily.     Marland Kitchen SHINGRIX injection   0  . sildenafil (REVATIO) 20 MG tablet Take 20 mg by mouth as needed (take 2-5 tablets as needed for erections).     No current facility-administered medications for this visit.    Medication Side Effects: None  Allergies: No Known Allergies  Past Medical History:  Diagnosis Date  . Anxiety   . Arthritis   . Cancer ([place])    skin CA removed  . Depression   . Diabetes mellitus   . Diabetes mellitus without complication (HCC)    Type 1  . Elevated PSA 11/2014   PCP ordered for patient to take Cipro for 21 days  . Erectile dysfunction   . Hypercholesteremia   . Skin cancer of face   . Sleep apnea    wears CPAP nightly    Family History  Problem Relation Age of Onset  . Cancer Father        Esophageal/gastric cancer  . Cancer Mother        Metastatic cancer  . Rheum arthritis Mother   . Colon cancer Neg Hx   . Pancreatic cancer Neg Hx   . Stomach cancer Neg Hx     Social History   Socioeconomic History  . Marital status: Married    Spouse name: Not on file  . Number of children: 2  . Years of education: Not on file  . Highest education level: Not on file  Occupational History  . Occupation: Retired    Fish farm manager: [employer]  Tobacco Use  . Smoking status: Former Smoker    Packs/day: 2.00    Years: 15.00    Pack years: 30.00    Types: Cigarettes    Quit date: 1981    Years since quitting: 40.1  . Smokeless tobacco: Never Used  Substance and Sexual Activity  . Alcohol use: No    Comment: 1986  . Drug use: No  . Sexual activity: Not on file  Other Topics Concern  . Not on file   Social History Narrative    Merged History Encounter        Social Determinants of Health   Financial Resource Strain:   . Difficulty of Paying Living Expenses: Not on file  Food Insecurity:   . Worried About Charity fundraiser in the Last Year: Not on file  . Ran Out of Food in the Last Year: Not on file  Transportation Needs:   .  Lack of Transportation (Medical): Not on file  . Lack of Transportation (Non-Medical): Not on file  Physical Activity:   . Days of Exercise per Week: Not on file  . Minutes of Exercise per Session: Not on file  Stress:   . Feeling of Stress : Not on file  Social Connections:   . Frequency of Communication with Friends and Family: Not on file  . Frequency of Social Gatherings with Friends and Family: Not on file  . Attends Religious Services: Not on file  . Active Member of Clubs or Organizations: Not on file  . Attends Archivist Meetings: Not on file  . Marital Status: Not on file  Intimate Partner Violence:   . Fear of Current or Ex-Partner: Not on file  . Emotionally Abused: Not on file  . Physically Abused: Not on file  . Sexually Abused: Not on file    Past Medical History, Surgical history, Social history, and Family history were reviewed and updated as appropriate.   Please see review of systems for further details on the patient's review from today.   Objective:   Physical Exam:  There were no vitals taken for this visit.  Physical Exam Constitutional:      General: He is not in acute distress.    Appearance: He is well-developed.  Musculoskeletal:        General: No deformity.  Neurological:     Mental Status: He is alert and oriented to person, place, and time.     Coordination: Coordination normal.  Psychiatric:        Attention and Perception: Attention and perception normal. He does not perceive auditory or visual hallucinations.        Mood and Affect: Mood is anxious and depressed. Affect is not labile,  blunt, angry or inappropriate.        Speech: Speech normal.        Behavior: Behavior normal.        Thought Content: Thought content normal. Thought content is not paranoid or delusional. Thought content does not include homicidal or suicidal ideation. Thought content does not include homicidal or suicidal plan.        Cognition and Memory: Cognition and memory normal.        Judgment: Judgment normal.     Comments: Insight intact     Lab Review:     Component Value Date/Time   NA 139 12/02/2014 0842   K 4.3 12/02/2014 0842   CL 106 12/02/2014 0842   CO2 25 12/02/2014 0842   GLUCOSE 223 (H) 12/02/2014 0842   BUN 24 (H) 12/02/2014 0842   CREATININE 0.87 12/02/2014 0842   CALCIUM 8.8 12/02/2014 0842   GFRNONAA 88 (L) 12/02/2014 0842   GFRAA >90 12/02/2014 0842       Component Value Date/Time   WBC 4.9 12/02/2014 0843   RBC 4.90 12/02/2014 0843   HGB 14.8 12/02/2014 0843   HCT 44.1 12/02/2014 0843   PLT 226 12/02/2014 0843   MCV 90.0 12/02/2014 0843   MCH 30.2 12/02/2014 0843   MCHC 33.6 12/02/2014 0843   RDW 13.1 12/02/2014 0843    No results found for: POCLITH, LITHIUM   No results found for: PHENYTOIN, PHENOBARB, VALPROATE, CBMZ   .res Assessment: Plan:    I[place]na was seen today for follow-up, depression and hypersomnolence.  Diagnoses and all orders for this visit:  Major depressive disorder, recurrent episode, moderate ([place])  Early onset dysthymia  Sleep apnea with hypersomnolence  Greater than 50% of 30 min  face to face time with patient was spent on counseling and coordination of care. We discussed When first seen he denied significant depressive symptoms although his wife thought he was depressed.  Last time he acknowledges that he does have some depressive symptoms as noted.  He realizes he also needs therapy for this tendency to self sabotage and also for marital conflict that has resulted.  Partial response to Wellbutrin XL 450 with  improvement in sadness and outlook and energy.  Tolerating it.  Option Abilify potentiation at 2-5 mg daily.  Disc SE in detail.  Discussed potential metabolic side effects associated with atypical antipsychotics, as well as potential risk for movement side effects. Advised pt to contact office if movement side effects occur.  Defer until after move.    Discussed alternative antidepressants if necessary.  Continue counseling to Dr. Rica Mote. For depression and chronic self sabotage and marital conflict. He agrees to this treatment plan.  Answered questions about how to implement constructive temporary therapeutic separations.  Gave steps such as self care, establishing positive hobbies, building good relationships with others, exercise, then dating wife after period of cooling off,  Etc.  Will defer to Dr. Rica Mote timing and implementation of details.  After Visit Summary for patient specific instructions  FU 3 months  Lynder Parents, MD, DFAPA   Future Appointments  Date Time Provider [place]  01/23/2020  8:00 AM Blanchie Serve, PhD [facility] None  02/07/2020  7:30 AM Hayden Pedro, MD [facility] None  10/09/2020  8:15 AM March Rummage, Christian Mate, DPM [facility]    No orders of the defined types were placed in this encounter.   -------------------------------

## 2020-01-09 NOTE — Patient Instructions (Signed)
Option aripiprazole

## 2020-01-11 DIAGNOSIS — J343 Hypertrophy of nasal turbinates: Secondary | ICD-10-CM | POA: Insufficient documentation

## 2020-01-11 DIAGNOSIS — R040 Epistaxis: Secondary | ICD-10-CM | POA: Diagnosis not present

## 2020-01-11 DIAGNOSIS — G4733 Obstructive sleep apnea (adult) (pediatric): Secondary | ICD-10-CM | POA: Diagnosis not present

## 2020-01-11 DIAGNOSIS — J342 Deviated nasal septum: Secondary | ICD-10-CM | POA: Diagnosis not present

## 2020-01-23 ENCOUNTER — Other Ambulatory Visit: Payer: Self-pay

## 2020-01-23 ENCOUNTER — Ambulatory Visit (INDEPENDENT_AMBULATORY_CARE_PROVIDER_SITE_OTHER): Payer: Medicare Other | Admitting: Psychiatry

## 2020-01-23 DIAGNOSIS — F3341 Major depressive disorder, recurrent, in partial remission: Secondary | ICD-10-CM | POA: Diagnosis not present

## 2020-01-23 DIAGNOSIS — Z630 Problems in relationship with spouse or partner: Secondary | ICD-10-CM

## 2020-01-23 DIAGNOSIS — G4733 Obstructive sleep apnea (adult) (pediatric): Secondary | ICD-10-CM

## 2020-01-23 DIAGNOSIS — Z4681 Encounter for fitting and adjustment of insulin pump: Secondary | ICD-10-CM | POA: Diagnosis not present

## 2020-01-23 DIAGNOSIS — E109 Type 1 diabetes mellitus without complications: Secondary | ICD-10-CM | POA: Diagnosis not present

## 2020-01-23 DIAGNOSIS — F341 Dysthymic disorder: Secondary | ICD-10-CM

## 2020-01-23 DIAGNOSIS — F331 Major depressive disorder, recurrent, moderate: Secondary | ICD-10-CM | POA: Diagnosis not present

## 2020-01-23 DIAGNOSIS — Z794 Long term (current) use of insulin: Secondary | ICD-10-CM | POA: Diagnosis not present

## 2020-01-23 DIAGNOSIS — I10 Essential (primary) hypertension: Secondary | ICD-10-CM | POA: Diagnosis not present

## 2020-01-23 DIAGNOSIS — E10319 Type 1 diabetes mellitus with unspecified diabetic retinopathy without macular edema: Secondary | ICD-10-CM | POA: Diagnosis not present

## 2020-01-23 NOTE — Progress Notes (Signed)
Psychotherapy Progress Note [facility] [name], PhD LP  Patient ID: Jermaine Waters     MRN: [id number] Therapy format: Individual psychotherapy Date: 01/23/2020      Start: 8:16a     Stop: 9:04a     Time Spent: 48 min Location: In-person   Session narrative (presenting needs, interim history, self-report of stressors and symptoms, applications of prior therapy, status changes, and interventions made in session) Found housing, plan to move out this coming weekend.  Yesterday informed [name], who said he can't do that, too many things need to be done.  She seems to be in a "spin", accomplishing little, spending 16 of her 17 hours a day communicating.  Oscillating between being caustic, as if trying to drive him out, and needing him.  Sees her making more reckless moves physically, damaging kitchen countertops and floor with unaware movements, her car cluttered and neglected.  She wants him to take the computer, alleging she will take over household bills and things the old fashioned way, by paper and checks.  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    Personally, began working again for a Engineer, maintenance (IT) acquaintance.  Gratifying enough, though time is more at a premium because of it.  Feeling stronger since an inspirational Zoom call last Wed as part of a juvenile diabetes support forum.  Awoke to the issue of sleep, and how he has been staying up at night trying to either be productive or get personal time to himself, and how he eats then.  Most of his adult life sleeping 6-7 hrs/night, started bedding down 10pm-6:30 or 7am last week, and discovered his overnight blood sugar suddenly much more stable for eliminating this semiconscious snack time.    Got 2nd COVID vaccination -- in [facility] -- [place] round trip, taken with [name], on a  friend's invitation.  Cecilia developed skin ulcers on her feet 2 wks after 1st COVID vax, but she has been resisting medical care anyway.  Still intoxicating herself daily.  Living situation will be a divorced man 71 years younger, with a 2-day child custody arrangement and early hours.  Very confident of it working out cleanly.  Has begun to consider the possibility of moving [place], also, to [employer], but is not making commitments to that as yet.  Challenged to consider -- unlikely as it seems -- what if [name] has an epiphany and changes her tone, asking him to stay.  Therapeutic modalities: Cognitive Behavioral Therapy and Solution-Oriented/Positive Psychology  Mental Status/Observations:  Appearance:   Neat     Behavior:  Appropriate  Motor:  Normal  Speech/Language:   Clear and Coherent  Affect:  Appropriate  Mood:  brighter, some apprehension  Thought process:  normal  Thought content:    WNL  Sensory/Perceptual disturbances:    WNL  Orientation:  Fully oriented  Attention:  Good  Concentration:  Good  Memory:  grossly intact  Insight:    Good  Judgment:   Good  Impulse Control:  Good   Risk Assessment: Danger to Self: No Self-injurious Behavior: No Danger to Others: No Physical Aggression / Violence: No Duty to Warn: No Access to Firearms a concern: No  Assessment of progress:  progressing well  Diagnosis:   ICD-10-CM   1. Major depressive disorder, recurrent episode, moderate (HCC)  F33.1   2. Major depressive disorder,  recurrent episode, in partial remission ([place])  F33.41   3. 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 Marital conflict  0000000   5. Diabetes mellitus type 1, controlled, insulin dependent (HCC)  E10.9   6. Obstructive sleep apnea  G47.33    Plan:  . Continue better sleep hours and control of evening snacking  . Hold off investigating relocation [place] -- need to see how physical separation works out first, since it is a test, not a life decision to  divorce . Think through his intentions if wife sours or softens . Work out a Financial risk analyst  -- don't leave it nebulous who is responsible for what, or negative dynamics will continue . Other recommendations/advice as may be noted above . Continue to utilize previously learned skills ad lib . Maintain medication as prescribed and work faithfully with relevant prescriber(s) if any changes are desired or seem indicated . Call the clinic on-call service, present to ER, or call 911 if any life-threatening psychiatric crisis . Return in about 2 weeks (around 02/06/2020) for time as available.Marland Kitchen  Next scheduled visit in this office 04/07/2020.  Blanchie Serve, PhD [name], PhD LP Clinical Psychologist, [facility] [address] [place], [place] [address] [phone]

## 2020-01-30 DIAGNOSIS — E7849 Other hyperlipidemia: Secondary | ICD-10-CM | POA: Diagnosis not present

## 2020-01-30 DIAGNOSIS — E10319 Type 1 diabetes mellitus with unspecified diabetic retinopathy without macular edema: Secondary | ICD-10-CM | POA: Diagnosis not present

## 2020-02-07 ENCOUNTER — Encounter (INDEPENDENT_AMBULATORY_CARE_PROVIDER_SITE_OTHER): Payer: Medicare Other | Admitting: Ophthalmology

## 2020-02-07 DIAGNOSIS — H43813 Vitreous degeneration, bilateral: Secondary | ICD-10-CM | POA: Diagnosis not present

## 2020-02-07 DIAGNOSIS — E113512 Type 2 diabetes mellitus with proliferative diabetic retinopathy with macular edema, left eye: Secondary | ICD-10-CM | POA: Diagnosis not present

## 2020-02-07 DIAGNOSIS — E113591 Type 2 diabetes mellitus with proliferative diabetic retinopathy without macular edema, right eye: Secondary | ICD-10-CM

## 2020-02-07 DIAGNOSIS — H34812 Central retinal vein occlusion, left eye, with macular edema: Secondary | ICD-10-CM | POA: Diagnosis not present

## 2020-02-07 DIAGNOSIS — E11311 Type 2 diabetes mellitus with unspecified diabetic retinopathy with macular edema: Secondary | ICD-10-CM | POA: Diagnosis not present

## 2020-02-19 ENCOUNTER — Ambulatory Visit (INDEPENDENT_AMBULATORY_CARE_PROVIDER_SITE_OTHER): Payer: Medicare Other | Admitting: Psychiatry

## 2020-02-19 DIAGNOSIS — F4322 Adjustment disorder with anxiety: Secondary | ICD-10-CM | POA: Diagnosis not present

## 2020-02-19 DIAGNOSIS — F341 Dysthymic disorder: Secondary | ICD-10-CM

## 2020-02-19 DIAGNOSIS — Z630 Problems in relationship with spouse or partner: Secondary | ICD-10-CM

## 2020-02-19 NOTE — Progress Notes (Signed)
Psychotherapy Progress Note [facility] [name], PhD LP  Patient ID: Jermaine Waters     MRN: [id number] Therapy format: Individual psychotherapy Date: 02/19/2020      Start: 8:13a     Stop: 9:00a     Time Spent: 47 min Location: Telehealth visit -- I connected with this patient by an approved telecommunication method (audio only), with his informed consent, and verifying identity and patient privacy.  I was located at my office and patient at his home.  As needed, we discussed the limitations, risks, and security and privacy concerns associated with telehealth service, including the availability and conditions which currently govern in-person appointments and the possibility that 3rd-party payment may not be fully guaranteed and he may be responsible for charges.  After he indicated understanding, we proceeded with the session.  Also discussed treatment planning, as needed, including ongoing verbal agreement with the plan, the opportunity to ask and answer all questions, his demonstrated understanding of instructions, and his readiness to call the office should symptoms worsen or he feels he is in a crisis state and needs more immediate and tangible assistance.   Session narrative (presenting needs, interim history, self-report of stressors and symptoms, applications of prior therapy, status changes, and interventions made in session) Email unresponsive, established by telephone.  Living with a younger man and his 72yo son, who keep conducive hours.  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  Have a group of 8-10 friends supporting PT and W in their discernment.  Wife out of town, visiting cousin and sister in [place], affording him an uncomplicated time to deal with things in the home, including tax prep.  Sees [name] having become more highly anxious before he moved, reasoning that she was intimidated by the realities of managing herself.  Family speculation she might have been  headed for hospitalization.  Now sees her having doubled down on being "chief nurse for the world", taking care of her sister and brother-in-law in [place], whom she often complains are underserved by their adult children, despite near 24-hour help.    Personally, it's been a relief not to have "frantic" [place] nearby.  Interaction a week ago filled with a tirade about things not taken care of, he felt eviscerated by it, found himself numbed and confused by anxiety and shame most of the day, then angry later at having lost a day to the defense mechanism.  Reviewed his performance in the call, wishes he had been more assertive about stopping the experience and prompting her to be more constructive.  Feels foreign to him, but still interested in recovering his assertiveness.  Hopeful that Cecilia's cousin has helped her illuminate her issues and needs, but assured this is a very long shot -- almost guaranteed she will be automatic, unconscious, and irritable until she really sees a change of ways from him beyond initial willingness to separate.    Coached in trying to keep it businesslike, addressing her complaints with a "customer service" orientation that starts with agreeing to have a difficult conversation, not just taking it one-sided, insists on taking on issues more one at a time where possible, constructively assures [name] she will get more out of him if she holds back from bulldozing him.  Noted that inside her bullying must be fear, and the intimidation she portrays is, on some level, what she herself feels somewhere in life but dominates in order to avoid feeling.  Affirmed the rights to participate or pause in any  interaction, and to agree, disagree, and to think about it before committing, knowing that renewing his own boundary-keeping and authority over the process of conflict is what stands a chance to change it to be more constructive, and, in fact, that he will, by virtue of modelling, pacing, and  holding out for healthier conflict process, be working his own behavior therapy upon [place], hopefully raising her capacity to recognize and pursue constructive conflict instead.  Noted that any credible reconciliation, as well as any constructive separation, are each built on fair fighting anyway.  Figures he can rehearse boundary-setting in the car, though he feels somewhat squeamish just talking about it.  Encouraged to imagine, write, talk to the mirror, record, or role play when available until comfortable enough.  Finding closer contact with the kids, particularly son Frederico Hamman, who is in 12-step recovery, is willing to call him out for evasions and falsehoods, and who set boundaries with his mother long ago about complaining and  Triangling him.  D Whitney is in touch, not much conversation about the separation, a more tolerant listener with her mother, in divorce herself, and prone to commiserate with her mother about how her own husband treats her.  Seems clear enough that Loree Fee is not against PT, but he finds himself wondering and assuming he is the butt of private comments.  Encouraged hm to make sure he doesn't let cynical imaginings take over, e.g., imagining the two of them commiserating about him -- it creates the same misery as witnessing it would, and imagining and assuming is as likely a way in which depression serves as a defense against anxiety, coaxing him to not even try new behavior with Cecilia.  Unfortunately, it would only look like more of being evasive and irresponsible with her, even as it would be to copy one of her most objectionable traits (cynicism).  Therapeutic modalities: Cognitive Behavioral Therapy, Solution-Oriented/Positive Psychology and Assertiveness/Communication  Mental Status/Observations:  Appearance:   Not assessed     Behavior:  Appropriate  Motor:  Not assessed  Speech/Language:   Clear and Coherent  Affect:  Not assessed  Mood:  anxious, but more  euthymic  Thought process:  normal  Thought content:    WNL  Sensory/Perceptual disturbances:    WNL  Orientation:  Fully oriented  Attention:  Good  Concentration:  Good  Memory:  WNL  Insight:    Good  Judgment:   Good  Impulse Control:  Good   Risk Assessment: Danger to Self: No Self-injurious Behavior: No Danger to Others: No Physical Aggression / Violence: No Duty to Warn: No Access to Firearms a concern: No  Assessment of progress:  progressing  Diagnosis:   ICD-10-CM   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 Marital conflict  0000000   3. Adjustment disorder with anxious mood  F43.22    Plan:  . Rehearse assertive responses to imaginable criticism, bullying.  Role play if desired. . Check cynical imagination -- could turn out true but owes it to himself and to family to try fair fighting and to exercise a healthier authority over participating or declining bad moments . Other recommendations/advice as may be noted above . Continue to utilize previously learned skills ad lib . Maintain medication as prescribed and work faithfully with relevant prescriber(s) if any changes are desired or seem indicated . Call the clinic on-call service, present to ER, or call 911 if any life-threatening psychiatric crisis . Return 2-4 weeks.Marland Kitchen  Next scheduled visit  in this office 04/07/2020.  Blanchie Serve, PhD [name], PhD LP Clinical Psychologist, [facility] [address] [place], [place] [address] [phone]

## 2020-03-03 DIAGNOSIS — Z794 Long term (current) use of insulin: Secondary | ICD-10-CM | POA: Diagnosis not present

## 2020-03-03 DIAGNOSIS — I10 Essential (primary) hypertension: Secondary | ICD-10-CM | POA: Diagnosis not present

## 2020-03-03 DIAGNOSIS — K219 Gastro-esophageal reflux disease without esophagitis: Secondary | ICD-10-CM | POA: Diagnosis not present

## 2020-03-03 DIAGNOSIS — Z4681 Encounter for fitting and adjustment of insulin pump: Secondary | ICD-10-CM | POA: Diagnosis not present

## 2020-03-03 DIAGNOSIS — E10319 Type 1 diabetes mellitus with unspecified diabetic retinopathy without macular edema: Secondary | ICD-10-CM | POA: Diagnosis not present

## 2020-03-23 ENCOUNTER — Other Ambulatory Visit: Payer: Self-pay | Admitting: Psychiatry

## 2020-04-02 ENCOUNTER — Encounter (INDEPENDENT_AMBULATORY_CARE_PROVIDER_SITE_OTHER): Payer: Medicare Other | Admitting: Ophthalmology

## 2020-04-02 DIAGNOSIS — E11311 Type 2 diabetes mellitus with unspecified diabetic retinopathy with macular edema: Secondary | ICD-10-CM

## 2020-04-02 DIAGNOSIS — H43813 Vitreous degeneration, bilateral: Secondary | ICD-10-CM

## 2020-04-02 DIAGNOSIS — H34812 Central retinal vein occlusion, left eye, with macular edema: Secondary | ICD-10-CM

## 2020-04-02 DIAGNOSIS — E113591 Type 2 diabetes mellitus with proliferative diabetic retinopathy without macular edema, right eye: Secondary | ICD-10-CM | POA: Diagnosis not present

## 2020-04-02 DIAGNOSIS — E113512 Type 2 diabetes mellitus with proliferative diabetic retinopathy with macular edema, left eye: Secondary | ICD-10-CM | POA: Diagnosis not present

## 2020-04-07 ENCOUNTER — Telehealth (INDEPENDENT_AMBULATORY_CARE_PROVIDER_SITE_OTHER): Payer: Medicare Other | Admitting: Psychiatry

## 2020-04-07 ENCOUNTER — Encounter: Payer: Self-pay | Admitting: Psychiatry

## 2020-04-07 DIAGNOSIS — Z630 Problems in relationship with spouse or partner: Secondary | ICD-10-CM

## 2020-04-07 DIAGNOSIS — F3341 Major depressive disorder, recurrent, in partial remission: Secondary | ICD-10-CM

## 2020-04-07 DIAGNOSIS — G4733 Obstructive sleep apnea (adult) (pediatric): Secondary | ICD-10-CM

## 2020-04-07 MED ORDER — BUPROPION HCL ER (XL) 150 MG PO TB24: 450.0000 mg | ORAL_TABLET | Freq: Every day | ORAL | 0 refills | Status: DC

## 2020-04-07 NOTE — Progress Notes (Signed)
Jermaine Waters [id number] 08/10/1946 74 y.o.  Virtual Visit via [facility]  I connected with pt by WebEx and verified that I am speaking with the correct person using two identifiers.   I discussed the limitations, risks, security and privacy concerns of performing an evaluation and management service by Jackquline [facility] and the availability of in person appointments. I also discussed with the patient that there may be a patient responsible charge related to this service. The patient expressed understanding and agreed to proceed.  I discussed the assessment and treatment plan with the patient. The patient was provided an opportunity to ask questions and all were answered. The patient agreed with the plan and demonstrated an understanding of the instructions.   The patient was advised to call back or seek an in-person evaluation if the symptoms worsen or if the condition fails to improve as anticipated.  I provided 30 minutes of video time during this encounter. The call started at 1000 and ended at 10:30. The patient was located at home and the provider was located office.  Subjective:   Patient ID:  Jermaine Waters is a 74 y.o. (DOB Nov 20, 1946) male.  Chief Complaint:  Chief Complaint  Patient presents with  . Follow-up    Depression  . Stress    Marital    Depression        Associated symptoms include fatigue.  Jermaine Waters presents to the office today for follow-up of mood concerns and hypersomnolence.    Patient was seen August 08, 2019.  The assessment was depression.  The following was done: PleasE STOP  bupropion 75 mg. Start bupropion XL 150 mg tablets 1 each morning for 2 weeks. If no side effects such as jitteriness, then increase to 2 tablets each morning which equals 300 mg daily. He continued Ritalin 10 QID.  Patient was seen October 10, 2019.  He had a partial response with Wellbutrin XL 300 mg daily.  He was tolerating it and therefore the dosage was increased to 450 mg a  day. He continued Ritalin 10 mg 4 times daily No Covid problems.   Didn't notice much difference with increase Wellbutrin.  Wife thinks he's a little more lethargic.  In some ways a little happier but really struggling with wife.  Pending temporary separation for a few months to try to provide some healing.  Hopefully will help both of them.   Wife concerned about Ritalin Rx for alertness DT OSA and central apnea that even affects him while awake.  PT meds could fall asleep driving.  Ritalin helps and CPAP helps.  Helps alerness and no insomnia.  Wonders about SE of it. A little more optimistic and less tense.  Therapy helpng also. No SE.  Not jitttery nor edgy.  Wife critical that he's less responsive, but maybe I'm less on edge. Energy is fine.  Interests hard to answer bc not a lot of outside activities left DT Covid.  Couple of small groups meeting with Zoom.  Most of free time before was singing but can't do it now. Things changed and wants meds and therapy.  PCP started Wellbutrin a couple of weeks ago. All my life self-sabotage with jobs and has to restart.  Fear of failure.  Some worsening health issues dropping productivity.  Wife frustrated with him and constantly advising and critical.  Marital stress and unhappiness worse bc together all the time. Plan because of residual depression consideration was given to augment with Abilify.  He was in the process of  moving and he felt that perhaps after the move he might have a different view and he wanted to defer.  So no meds were changed.  Appointment Apr 07, 2020, the following was noted: He did get moved.  Still having conflict with his wife which is a chronic stress. Pt reports that mood is Anxious, Depressed and Dysphoric and describes depression and anxiety as mild or mild to moderate but overall improved since last visit.. Anxiety symptoms include: Excessive Worry, avoidant of stress or pressure or commitments.. Pt reports no sleep issues and  7 hours but prefers 8 hours. Pt reports that appetite is good. Pt reports that energy is poor and anhedonia, poor motivation and withdrawn from usual activities. Concentration is down slightly. Suicidal thoughts:  denied by patient.  Ritalin 10 QID for hypersomnolence but questions ADD bc is distractible.  Sleep apnea for 20 years on CPAP.  Chronically sleepy.  Past Psychiatric Medication Trials:  History Jermaine Waters.  On and off antidepressants for 20 years  Doesn't remember any being helpful.  Remote Wellbutrin ? Dose.  Doesn't remember the names of prior meds but remembers sexual SE with one. History of Nuvigil for hypersomnolence but he does not recall the response.  Review of Systems:  Review of Systems  Constitutional: Positive for fatigue.  Eyes: Positive for visual disturbance.  Cardiovascular: Negative for palpitations.  Musculoskeletal: Positive for arthralgias.  Neurological: Negative for tremors and weakness.  Psychiatric/Behavioral: Positive for depression.    Medications: I have reviewed the patient's current medications.  Current Outpatient Medications  Medication Sig Dispense Refill  . amLODipine (NORVASC) 2.5 MG tablet TK 1 T PO QD  12  . buPROPion (WELLBUTRIN XL) 150 MG 24 hr tablet Take 3 tablets (450 mg total) by mouth daily. 270 tablet 0  . Cinnamon 500 MG capsule Take 500 mg by mouth daily.    . CONTOUR NEXT TEST test strip     . Cyanocobalamin (VITAMIN B 12) 500 MCG TABS Take 2,000 Units by mouth.    . methylphenidate (RITALIN) 10 MG tablet Take 10 mg by mouth 4 (four) times daily.    Marland Kitchen NOVOLOG 100 UNIT/ML injection U IN INSULIN PUMP .APPROXIMATELY 80 UNITS A DAY  6  . pravastatin (PRAVACHOL) 20 MG tablet TK 1 T PO QD    . PROLENSA 0.07 % SOLN INT 1 GTT IN OS HS  6  . ramipril (ALTACE) 5 MG capsule Take 5 mg by mouth 2 (two) times daily.     Marland Kitchen SHINGRIX injection   0  . sildenafil (REVATIO) 20 MG tablet Take 20 mg by mouth as needed (take 2-5 tablets as  needed for erections).     No current facility-administered medications for this visit.    Medication Side Effects: None  Allergies: No Known Allergies  Past Medical History:  Diagnosis Date  . Anxiety   . Arthritis   . Cancer ([place])    skin CA removed  . Depression   . Diabetes mellitus   . Diabetes mellitus without complication (HCC)    Type 1  . Elevated PSA 11/2014   PCP ordered for patient to take Cipro for 21 days  . Erectile dysfunction   . Hypercholesteremia   . Skin cancer of face   . Sleep apnea    wears CPAP nightly    Family History  Problem Relation Age of Onset  . Cancer Father        Esophageal/gastric cancer  . Cancer Mother  Metastatic cancer  . Rheum arthritis Mother   . Colon cancer Neg Hx   . Pancreatic cancer Neg Hx   . Stomach cancer Neg Hx     Social History   Socioeconomic History  . Marital status: Married    Spouse name: Not on file  . Number of children: 2  . Years of education: Not on file  . Highest education level: Not on file  Occupational History  . Occupation: Retired    Fish farm manager: [employer]  Tobacco Use  . Smoking status: Former Smoker    Packs/day: 2.00    Years: 15.00    Pack years: 30.00    Types: Cigarettes    Quit date: 1981    Years since quitting: 40.3  . Smokeless tobacco: Never Used  Substance and Sexual Activity  . Alcohol use: No    Comment: 1986  . Drug use: No  . Sexual activity: Not on file  Other Topics Concern  . Not on file  Social History Narrative    Merged History Encounter        Social Determinants of Health   Financial Resource Strain:   . Difficulty of Paying Living Expenses:   Food Insecurity:   . Worried About Charity fundraiser in the Last Year:   . Arboriculturist in the Last Year:   Transportation Needs:   . Film/video editor (Medical):   Marland Kitchen Lack of Transportation (Non-Medical):   Physical Activity:   . Days of Exercise per Week:   . Minutes of Exercise per  Session:   Stress:   . Feeling of Stress :   Social Connections:   . Frequency of Communication with Friends and Family:   . Frequency of Social Gatherings with Friends and Family:   . Attends Religious Services:   . Active Member of Clubs or Organizations:   . Attends Archivist Meetings:   Marland Kitchen Marital Status:   Intimate Partner Violence:   . Fear of Current or Ex-Partner:   . Emotionally Abused:   Marland Kitchen Physically Abused:   . Sexually Abused:     Past Medical History, Surgical history, Social history, and Family history were reviewed and updated as appropriate.   Please see review of systems for further details on the patient's review from today.   Objective:   Physical Exam:  There were no vitals taken for this visit.  Physical Exam Neurological:     Mental Status: He is alert and oriented to person, place, and time.     Cranial Nerves: No dysarthria.  Psychiatric:        Attention and Perception: Attention and perception normal.        Mood and Affect: Mood is depressed.        Speech: Speech normal.        Behavior: Behavior is cooperative.        Thought Content: Thought content normal. Thought content is not paranoid or delusional. Thought content does not include homicidal or suicidal ideation. Thought content does not include homicidal or suicidal plan.        Cognition and Memory: Cognition and memory normal.        Judgment: Judgment normal.     Comments: Insight intact Mild residual depression and anxiety     Lab Review:     Component Value Date/Time   NA 139 12/02/2014 0842   K 4.3 12/02/2014 0842   CL 106 12/02/2014 0842  CO2 25 12/02/2014 0842   GLUCOSE 223 (H) 12/02/2014 0842   BUN 24 (H) 12/02/2014 0842   CREATININE 0.87 12/02/2014 0842   CALCIUM 8.8 12/02/2014 0842   GFRNONAA 88 (L) 12/02/2014 0842   GFRAA >90 12/02/2014 0842       Component Value Date/Time   WBC 4.9 12/02/2014 0843   RBC 4.90 12/02/2014 0843   HGB 14.8 12/02/2014  0843   HCT 44.1 12/02/2014 0843   PLT 226 12/02/2014 0843   MCV 90.0 12/02/2014 0843   MCH 30.2 12/02/2014 0843   MCHC 33.6 12/02/2014 0843   RDW 13.1 12/02/2014 0843    No results found for: POCLITH, LITHIUM   No results found for: PHENYTOIN, PHENOBARB, VALPROATE, CBMZ   .res Assessment: Plan:    Alfard was seen today for follow-up and stress.  Diagnoses and all orders for this visit:  Major depressive disorder, recurrent episode, in partial remission (HCC) -     buPROPion (WELLBUTRIN XL) 150 MG 24 hr tablet; Take 3 tablets (450 mg total) by mouth daily.  Marital conflict  Obstructive sleep apnea     Greater than 50% of 30 min  face to face time with patient was spent on counseling and coordination of care. We discussed When first seen he denied significant depressive symptoms although his wife thought he was depressed.  Last time he acknowledges that he does have some depressive symptoms as noted.  He realizes he also needs therapy for this tendency to self sabotage and also for marital conflict that has resulted.  Partial response to Wellbutrin XL 450 with improvement in sadness and outlook and energy.  Tolerating it.  Overall he is satisfied.  Option Abilify potentiation at 2-5 mg daily.  Disc SE in detail.  Discussed potential metabolic side effects associated with atypical antipsychotics, as well as potential risk for movement side effects. Advised pt to contact office if movement side effects occur.   Discussed alternative antidepressants if necessary.  Continue counseling to Dr. Rica Mote. For depression and chronic self sabotage and marital conflict. He agrees to this treatment plan.  Answered questions about how to implement constructive temporary therapeutic separations.  Gave steps such as self care, establishing positive hobbies, building good relationships with others, exercise, then dating wife after period of cooling off,  Etc.  Will defer to Dr. Rica Mote timing  and implementation of details.  No med changes indicated  After Visit Summary for patient specific instructions  FU 6 months  Lynder Parents, MD, DFAPA   Future Appointments  Date Time Provider [place]  05/28/2020  7:30 AM Hayden Pedro, MD [facility] None  10/09/2020  8:15 AM March Rummage, Christian Mate, DPM [facility]    No orders of the defined types were placed in this encounter.   -------------------------------

## 2020-04-22 DIAGNOSIS — Z4681 Encounter for fitting and adjustment of insulin pump: Secondary | ICD-10-CM | POA: Diagnosis not present

## 2020-04-22 DIAGNOSIS — E10319 Type 1 diabetes mellitus with unspecified diabetic retinopathy without macular edema: Secondary | ICD-10-CM | POA: Diagnosis not present

## 2020-04-22 DIAGNOSIS — Z794 Long term (current) use of insulin: Secondary | ICD-10-CM | POA: Diagnosis not present

## 2020-04-22 DIAGNOSIS — I10 Essential (primary) hypertension: Secondary | ICD-10-CM | POA: Diagnosis not present

## 2020-05-21 ENCOUNTER — Other Ambulatory Visit: Payer: Self-pay

## 2020-05-21 ENCOUNTER — Encounter: Payer: Self-pay | Admitting: Psychiatry

## 2020-05-21 ENCOUNTER — Ambulatory Visit (INDEPENDENT_AMBULATORY_CARE_PROVIDER_SITE_OTHER): Payer: Medicare Other | Admitting: Psychiatry

## 2020-05-21 DIAGNOSIS — F341 Dysthymic disorder: Secondary | ICD-10-CM

## 2020-05-21 DIAGNOSIS — F3341 Major depressive disorder, recurrent, in partial remission: Secondary | ICD-10-CM

## 2020-05-21 DIAGNOSIS — F411 Generalized anxiety disorder: Secondary | ICD-10-CM

## 2020-05-21 DIAGNOSIS — G4733 Obstructive sleep apnea (adult) (pediatric): Secondary | ICD-10-CM | POA: Diagnosis not present

## 2020-05-21 MED ORDER — ESCITALOPRAM OXALATE 10 MG PO TABS: ORAL_TABLET | ORAL | 1 refills | Status: DC

## 2020-05-21 NOTE — Progress Notes (Signed)
Jermaine Waters [id number] [birth year] 74 y.o.   Subjective:   Patient ID:  Jermaine Waters is a 74 y.o. (DOB 1946/03/27) male.  Chief Complaint:  Chief Complaint  Patient presents with  . ADHD    procrastination  . Depression  . Anxiety  . Follow-up    Depression        Associated symptoms include fatigue.  Jermaine Waters presents to the office today for follow-up of mood concerns and hypersomnolence.    Patient was seen August 08, 2019.  The assessment was depression.  The following was done: PleasE STOP  bupropion 75 mg. Start bupropion XL 150 mg tablets 1 each morning for 2 weeks. If no side effects such as jitteriness, then increase to 2 tablets each morning which equals 300 mg daily. He continued Ritalin 10 QID.  Patient was seen October 10, 2019.  He had a partial response with Wellbutrin XL 300 mg daily.  He was tolerating it and therefore the dosage was increased to 450 mg a day. He continued Ritalin 10 mg 4 times daily No Covid problems.   Didn't notice much difference with increase Wellbutrin.  Wife thinks he's a little more lethargic.  In some ways a little happier but really struggling with wife.  Pending temporary separation for a few months to try to provide some healing.  Hopefully will help both of them.   Wife concerned about Ritalin Rx for alertness DT OSA and central apnea that even affects him while awake.  PT meds could fall asleep driving.  Ritalin helps and CPAP helps.  Helps alerness and no insomnia.  Wonders about SE of it. A little more optimistic and less tense.  Therapy helpng also. No SE.  Not jitttery nor edgy.  Wife critical that he's less responsive, but maybe I'm less on edge. Energy is fine.  Interests hard to answer bc not a lot of outside activities left DT Covid.  Couple of small groups meeting with Zoom.  Most of free time before was singing but can't do it now. Things changed and wants meds and therapy.  PCP started Wellbutrin a couple of weeks  ago. All my life self-sabotage with jobs and has to restart.  Fear of failure.  Some worsening health issues dropping productivity.  Wife frustrated with him and constantly advising and critical.  Marital stress and unhappiness worse bc together all the time. Plan because of residual depression consideration was given to augment with Abilify.  He was in the process of moving and he felt that perhaps after the move he might have a different view and he wanted to defer.  So no meds were changed.  Appointment Apr 07, 2020, the following was noted: He did get moved.  Still having conflict with his wife which is a chronic stress. Pt reports that mood is Anxious, Depressed and Dysphoric and describes depression and anxiety as mild or mild to moderate but overall improved since last visit.. Anxiety symptoms include: Excessive Worry, avoidant of stress or pressure or commitments.. Pt reports no sleep issues and 7 hours but prefers 8 hours. Pt reports that appetite is good. Pt reports that energy is poor and anhedonia, poor motivation and withdrawn from usual activities. Concentration is down slightly. Suicidal thoughts:  denied by patient. Plan:  No med changes  05/21/2020 appointment the following is noted: Urgent appt at his request. One concern is more anxiety and procrastinates.[place] if he's working on the wrong problem.  [name] of everything.  ?  Fear of failure.  Easily stressed by small demands.  Stress averse and not operating at full capacity. No history of anxiety meds.   Counseling.  Ritalin 10 QID for hypersomnolence but questions ADD bc is distractible.  Sleep apnea for 20 years on CPAP.  Chronically sleepy.  Past Psychiatric Medication Trials:  History [name].  On and off antidepressants for 20 years  Doesn't remember any being helpful.  Remote Wellbutrin ? Dose.  Doesn't remember the names of prior meds but remembers sexual SE with one. History of Nuvigil for hypersomnolence but  he does not recall the response.  Review of Systems:  Review of Systems  Constitutional: Positive for fatigue.  Eyes: Positive for visual disturbance.  Cardiovascular: Negative for palpitations.  Gastrointestinal: Negative for diarrhea.  Musculoskeletal: Positive for arthralgias.  Neurological: Negative for tremors and weakness.  Psychiatric/Behavioral: Positive for depression.    Medications: I have reviewed the patient's current medications.  Current Outpatient Medications  Medication Sig Dispense Refill  . amLODipine (NORVASC) 2.5 MG tablet TK 1 T PO QD  12  . buPROPion (WELLBUTRIN XL) 150 MG 24 hr tablet Take 3 tablets (450 mg total) by mouth daily. 270 tablet 0  . Cinnamon 500 MG capsule Take 500 mg by mouth daily.    . CONTOUR NEXT TEST test strip     . Cyanocobalamin (VITAMIN B 12) 500 MCG TABS Take 2,000 Units by mouth.    . methylphenidate (RITALIN) 10 MG tablet Take 10 mg by mouth 4 (four) times daily.    Marland Kitchen NOVOLOG 100 UNIT/ML injection U IN INSULIN PUMP .APPROXIMATELY 80 UNITS A DAY  6  . pravastatin (PRAVACHOL) 20 MG tablet TK 1 T PO QD    . PROLENSA 0.07 % SOLN INT 1 GTT IN OS HS  6  . ramipril (ALTACE) 5 MG capsule Take 5 mg by mouth 2 (two) times daily.     Marland Kitchen SHINGRIX injection   0  . sildenafil (REVATIO) 20 MG tablet Take 20 mg by mouth as needed (take 2-5 tablets as needed for erections).    Marland Kitchen escitalopram (LEXAPRO) 10 MG tablet 1/2 tablet at night for a week, then 1 tablet at night 30 tablet 1   No current facility-administered medications for this visit.    Medication Side Effects: None  Allergies: No Known Allergies  Past Medical History:  Diagnosis Date  . Anxiety   . Arthritis   . Cancer ([place])    skin CA removed  . Depression   . Diabetes mellitus   . Diabetes mellitus without complication (HCC)    Type 1  . Elevated PSA 11/2014   PCP ordered for patient to take Cipro for 21 days  . Erectile dysfunction   . Hypercholesteremia   . Skin cancer  of face   . Sleep apnea    wears CPAP nightly    Family History  Problem Relation Age of Onset  . Cancer Father        Esophageal/gastric cancer  . Cancer Mother        Metastatic cancer  . Rheum arthritis Mother   . Colon cancer Neg Hx   . Pancreatic cancer Neg Hx   . Stomach cancer Neg Hx     Social History   Socioeconomic History  . Marital status: Married    Spouse name: Not on file  . Number of children: 2  . Years of education: Not on file  . Highest education level: Not on file  Occupational History  . Occupation: Retired    Fish farm manager: [employer]  Tobacco Use  . Smoking status: Former Smoker    Packs/day: 2.00    Years: 15.00    Pack years: 30.00    Types: Cigarettes    Quit date: 1981    Years since quitting: 40.4  . Smokeless tobacco: Never Used  Substance and Sexual Activity  . Alcohol use: No    Comment: 1986  . Drug use: No  . Sexual activity: Not on file  Other Topics Concern  . Not on file  Social History Narrative    Merged History Encounter        Social Determinants of Health   Financial Resource Strain:   . Difficulty of Paying Living Expenses:   Food Insecurity:   . Worried About Charity fundraiser in the Last Year:   . Arboriculturist in the Last Year:   Transportation Needs:   . Film/video editor (Medical):   Marland Kitchen Lack of Transportation (Non-Medical):   Physical Activity:   . Days of Exercise per Week:   . Minutes of Exercise per Session:   Stress:   . Feeling of Stress :   Social Connections:   . Frequency of Communication with Friends and Family:   . Frequency of Social Gatherings with Friends and Family:   . Attends Religious Services:   . Active Member of Clubs or Organizations:   . Attends Archivist Meetings:   Marland Kitchen Marital Status:   Intimate Partner Violence:   . Fear of Current or Ex-Partner:   . Emotionally Abused:   Marland Kitchen Physically Abused:   . Sexually Abused:     Past Medical History, Surgical  history, Social history, and Family history were reviewed and updated as appropriate.   Please see review of systems for further details on the patient's review from today.   Objective:   Physical Exam:  There were no vitals taken for this visit.  Physical Exam Constitutional:      General: He is not in acute distress. Musculoskeletal:        General: No deformity.  Neurological:     Mental Status: He is alert and oriented to person, place, and time.     Cranial Nerves: No dysarthria.     Coordination: Coordination normal.  Psychiatric:        Attention and Perception: Attention and perception normal. He does not perceive auditory or visual hallucinations.        Mood and Affect: Mood is anxious and depressed. Affect is not labile, blunt, angry or inappropriate.        Speech: Speech normal.        Behavior: Behavior normal. Behavior is cooperative.        Thought Content: Thought content normal. Thought content is not paranoid or delusional. Thought content does not include homicidal or suicidal ideation. Thought content does not include homicidal or suicidal plan.        Cognition and Memory: Cognition and memory normal.        Judgment: Judgment normal.     Comments: Insight intact Mild residual depression and anxiety     Lab Review:     Component Value Date/Time   NA 139 12/02/2014 0842   K 4.3 12/02/2014 0842   CL 106 12/02/2014 0842   CO2 25 12/02/2014 0842   GLUCOSE 223 (H) 12/02/2014 0842   BUN 24 (H) 12/02/2014 0842   CREATININE 0.87  12/02/2014 0842   CALCIUM 8.8 12/02/2014 0842   GFRNONAA 88 (L) 12/02/2014 0842   GFRAA >90 12/02/2014 0842       Component Value Date/Time   WBC 4.9 12/02/2014 0843   RBC 4.90 12/02/2014 0843   HGB 14.8 12/02/2014 0843   HCT 44.1 12/02/2014 0843   PLT 226 12/02/2014 0843   MCV 90.0 12/02/2014 0843   MCH 30.2 12/02/2014 0843   MCHC 33.6 12/02/2014 0843   RDW 13.1 12/02/2014 0843    No results found for: POCLITH, LITHIUM    No results found for: PHENYTOIN, PHENOBARB, VALPROATE, CBMZ   .res Assessment: Plan:    Ojas was seen today for adhd, depression, anxiety and follow-up.  Diagnoses and all orders for this visit:  Major depressive disorder, recurrent episode, in partial remission ([place])  Generalized anxiety disorder  Obstructive sleep apnea  Early onset dysthymia  Other orders -     escitalopram (LEXAPRO) 10 MG tablet; 1/2 tablet at night for a week, then 1 tablet at night     Greater than 50% of 30 min  face to face time with patient was spent on counseling and coordination of care. We discussed When first seen he denied significant depressive symptoms although his wife thought he was depressed.  Last time he acknowledges that he does have some depressive symptoms as noted.  He realizes he also needs therapy for this tendency to self sabotage and also for marital conflict that has resulted.  Partial response to Wellbutrin XL 450 with improvement in sadness and outlook and energy.  Tolerating it.   `Continue counseling to Dr. Rica Mote. For depression and chronic self sabotage and marital conflict. He agrees to this treatment plan.  Answered questions about how to implement constructive temporary therapeutic separations.  Gave steps such as self care, establishing positive hobbies, building good relationships with others, exercise, then dating wife after period of cooling off,  Etc.  Will defer to Dr. Rica Mote timing and implementation of details.   Discussed the difference between character procrastination versus anxiety driven procrastination.  This may or may not help but it does appear that he has an anxiety component.  If this works well he wants to consider weaning off Wellbutrin. Lexapro 10 mg nightly for anxiety.   Disc SE in detail and SSRI withdrawal sx. Disc alternative of buspirone and the pros cons including different SSRIs.  After Visit Summary for patient specific  instructions  FU August or Sept  Lynder Parents, MD, DFAPA   Future Appointments  Date Time Provider [place]  05/28/2020  7:30 AM Hayden Pedro, MD [facility] None  08/26/2020  9:45 AM Cottle, Billey Co., MD [facility] None  10/09/2020  8:15 AM March Rummage Christian Mate, DPM [facility]    No orders of the defined types were placed in this encounter.   -------------------------------

## 2020-05-28 ENCOUNTER — Other Ambulatory Visit: Payer: Self-pay

## 2020-05-28 ENCOUNTER — Encounter (INDEPENDENT_AMBULATORY_CARE_PROVIDER_SITE_OTHER): Payer: Medicare Other | Admitting: Ophthalmology

## 2020-05-28 DIAGNOSIS — H34812 Central retinal vein occlusion, left eye, with macular edema: Secondary | ICD-10-CM

## 2020-05-28 DIAGNOSIS — E113591 Type 2 diabetes mellitus with proliferative diabetic retinopathy without macular edema, right eye: Secondary | ICD-10-CM | POA: Diagnosis not present

## 2020-05-28 DIAGNOSIS — E113512 Type 2 diabetes mellitus with proliferative diabetic retinopathy with macular edema, left eye: Secondary | ICD-10-CM

## 2020-05-28 DIAGNOSIS — E11311 Type 2 diabetes mellitus with unspecified diabetic retinopathy with macular edema: Secondary | ICD-10-CM

## 2020-05-28 DIAGNOSIS — H43813 Vitreous degeneration, bilateral: Secondary | ICD-10-CM

## 2020-06-20 ENCOUNTER — Other Ambulatory Visit: Payer: Self-pay | Admitting: Psychiatry

## 2020-06-20 DIAGNOSIS — F3341 Major depressive disorder, recurrent, in partial remission: Secondary | ICD-10-CM

## 2020-07-22 DIAGNOSIS — Z794 Long term (current) use of insulin: Secondary | ICD-10-CM | POA: Diagnosis not present

## 2020-07-22 DIAGNOSIS — E10319 Type 1 diabetes mellitus with unspecified diabetic retinopathy without macular edema: Secondary | ICD-10-CM | POA: Diagnosis not present

## 2020-07-22 DIAGNOSIS — I10 Essential (primary) hypertension: Secondary | ICD-10-CM | POA: Diagnosis not present

## 2020-07-22 DIAGNOSIS — Z4681 Encounter for fitting and adjustment of insulin pump: Secondary | ICD-10-CM | POA: Diagnosis not present

## 2020-07-24 ENCOUNTER — Other Ambulatory Visit: Payer: Self-pay

## 2020-07-24 MED ORDER — ESCITALOPRAM OXALATE 10 MG PO TABS: ORAL_TABLET | ORAL | 1 refills | Status: DC

## 2020-07-31 ENCOUNTER — Other Ambulatory Visit: Payer: Self-pay

## 2020-07-31 ENCOUNTER — Encounter (INDEPENDENT_AMBULATORY_CARE_PROVIDER_SITE_OTHER): Payer: Medicare Other | Admitting: Ophthalmology

## 2020-07-31 DIAGNOSIS — H43813 Vitreous degeneration, bilateral: Secondary | ICD-10-CM | POA: Diagnosis not present

## 2020-07-31 DIAGNOSIS — H34812 Central retinal vein occlusion, left eye, with macular edema: Secondary | ICD-10-CM

## 2020-07-31 DIAGNOSIS — E113512 Type 2 diabetes mellitus with proliferative diabetic retinopathy with macular edema, left eye: Secondary | ICD-10-CM | POA: Diagnosis not present

## 2020-07-31 DIAGNOSIS — E113591 Type 2 diabetes mellitus with proliferative diabetic retinopathy without macular edema, right eye: Secondary | ICD-10-CM

## 2020-07-31 DIAGNOSIS — E11311 Type 2 diabetes mellitus with unspecified diabetic retinopathy with macular edema: Secondary | ICD-10-CM

## 2020-08-26 ENCOUNTER — Telehealth (INDEPENDENT_AMBULATORY_CARE_PROVIDER_SITE_OTHER): Payer: Medicare Other | Admitting: Psychiatry

## 2020-08-26 ENCOUNTER — Encounter: Payer: Self-pay | Admitting: Psychiatry

## 2020-08-26 DIAGNOSIS — F3341 Major depressive disorder, recurrent, in partial remission: Secondary | ICD-10-CM

## 2020-08-26 DIAGNOSIS — G4730 Sleep apnea, unspecified: Secondary | ICD-10-CM | POA: Diagnosis not present

## 2020-08-26 DIAGNOSIS — F411 Generalized anxiety disorder: Secondary | ICD-10-CM | POA: Diagnosis not present

## 2020-08-26 DIAGNOSIS — F3342 Major depressive disorder, recurrent, in full remission: Secondary | ICD-10-CM

## 2020-08-26 DIAGNOSIS — F341 Dysthymic disorder: Secondary | ICD-10-CM | POA: Diagnosis not present

## 2020-08-26 DIAGNOSIS — G4710 Hypersomnia, unspecified: Secondary | ICD-10-CM | POA: Diagnosis not present

## 2020-08-26 MED ORDER — BUPROPION HCL ER (XL) 150 MG PO TB24: ORAL_TABLET | ORAL | 1 refills | Status: DC

## 2020-08-26 MED ORDER — METHYLPHENIDATE HCL 10 MG PO TABS: 10.0000 mg | ORAL_TABLET | Freq: Four times a day (QID) | ORAL | 0 refills | Status: DC

## 2020-08-26 MED ORDER — ESCITALOPRAM OXALATE 10 MG PO TABS: 10.0000 mg | ORAL_TABLET | Freq: Every day | ORAL | 1 refills | Status: DC

## 2020-08-26 NOTE — Progress Notes (Signed)
Jermaine Waters [id number] 27-Dec-1945 74 y.o.  Video Visit via My Chart  I connected with pt by My Chart and verified that I am speaking with the correct person using two identifiers.   I discussed the limitations, risks, security and privacy concerns of performing an evaluation and management service by My Chart  and the availability of in person appointments. I also discussed with the patient that there may be a patient responsible charge related to this service. The patient expressed understanding and agreed to proceed.  I discussed the assessment and treatment plan with the patient. The patient was provided an opportunity to ask questions and all were answered. The patient agreed with the plan and demonstrated an understanding of the instructions.   The patient was advised to call back or seek an in-person evaluation if the symptoms worsen or if the condition fails to improve as anticipated.  I provided 30 minutes of video time during this encounter.  The patient was located at home and the provider was located office.   Subjective:   Patient ID:  Jermaine Waters is a 74 y.o. (DOB 02/24/1946) male.  Chief Complaint:  Chief Complaint  Patient presents with  . Follow-up    Medication Management  . Depression    Medication Management  . Anxiety    Depression        Associated symptoms include no fatigue.  Jermaine Waters presents to the office today for follow-up of mood concerns and hypersomnolence.    Patient was seen August 08, 2019.  The assessment was depression.  The following was done: PleasE STOP  bupropion 75 mg. Start bupropion XL 150 mg tablets 1 each morning for 2 weeks. If no side effects such as jitteriness, then increase to 2 tablets each morning which equals 300 mg daily. He continued Ritalin 10 QID.  Patient was seen October 10, 2019.  He had a partial response with Wellbutrin XL 300 mg daily.  He was tolerating it and therefore the dosage was increased to 450  mg a day. He continued Ritalin 10 mg 4 times daily No Covid problems.   Didn't notice much difference with increase Wellbutrin.  Wife thinks he's a little more lethargic.  In some ways a little happier but really struggling with wife.  Pending temporary separation for a few months to try to provide some healing.  Hopefully will help both of them.   Wife concerned about Ritalin Rx for alertness DT OSA and central apnea that even affects him while awake.  PT meds could fall asleep driving.  Ritalin helps and CPAP helps.  Helps alerness and no insomnia.  Wonders about SE of it. A little more optimistic and less tense.  Therapy helpng also. No SE.  Not jitttery nor edgy.  Wife critical that he's less responsive, but maybe I'm less on edge. Energy is fine.  Interests hard to answer bc not a lot of outside activities left DT Covid.  Couple of small groups meeting with Zoom.  Most of free time before was singing but can't do it now. Things changed and wants meds and therapy.  PCP started Wellbutrin a couple of weeks ago. All my life self-sabotage with jobs and has to restart.  Fear of failure.  Some worsening health issues dropping productivity.  Wife frustrated with him and constantly advising and critical.  Marital stress and unhappiness worse bc together all the time. Plan because of residual depression consideration was given to augment with Abilify.  He was in  the process of moving and he felt that perhaps after the move he might have a different view and he wanted to defer.  So no meds were changed.  Appointment Apr 07, 2020, the following was noted: He did get moved.  Still having conflict with his wife which is a chronic stress. Pt reports that mood is Anxious, Depressed and Dysphoric and describes depression and anxiety as mild or mild to moderate but overall improved since last visit.. Anxiety symptoms include: Excessive Worry, avoidant of stress or pressure or commitments.. Pt reports no sleep issues  and 7 hours but prefers 8 hours. Pt reports that appetite is good. Pt reports that energy is poor and anhedonia, poor motivation and withdrawn from usual activities. Concentration is down slightly. Suicidal thoughts:  denied by patient. Plan:  No med changes  05/21/2020 appointment the following is noted: Urgent appt at his request. One concern is more anxiety and procrastinates.Jermaine Waters if he's working on the wrong problem.  Jermaine Waters of everything.  ? Fear of failure.  Easily stressed by small demands.  Stress averse and not operating at full capacity. No history of anxiety meds.   Counseling. Ritalin 10 QID for hypersomnolence but questions ADD bc is distractible.  Sleep apnea for 20 years on CPAP.  Chronically sleepy. Plan: no med changes:  08/26/2020 appointment with the following noted: Increased Wellbutrin to 450 mg has worked fine. Feeling great.  Only negative is sleepy if sits down to read. Sleepy all his life and long term CPAP user.  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 No trouble with AK since July 20 and no anxiety while she's gone.    Past Psychiatric Medication Trials:  History Jermaine Waters.  On and off antidepressants for 20 years  Doesn't remember any being helpful.  lexapro 10  Remote Wellbutrin 450.  Doesn't remember the names of prior meds but remembers sexual SE with one. History of Nuvigil for hypersomnolence but he does not recall the response but apparently not good enough so Ritalin was RX in it's place  Review of Systems:  Review of Systems  Constitutional: Negative for fatigue.  Eyes: Positive for visual disturbance.  Cardiovascular: Negative for palpitations.  Gastrointestinal: Negative for diarrhea.  Musculoskeletal: Positive for arthralgias.  Neurological: Negative for tremors and weakness.  Psychiatric/Behavioral: Positive for depression.    Medications: I have reviewed the patient's current medications.  Current Outpatient Medications   Medication Sig Dispense Refill  . amLODipine (NORVASC) 2.5 MG tablet TK 1 T PO QD  12  . CONTOUR NEXT TEST test strip     . Cyanocobalamin (VITAMIN B 12) 500 MCG TABS Take 2,000 Units by mouth.    . magnesium 30 MG tablet Take 30 mg by mouth 2 (two) times daily.    Marland Kitchen NOVOLOG 100 UNIT/ML injection U IN INSULIN PUMP .APPROXIMATELY 80 UNITS A DAY  6  . pravastatin (PRAVACHOL) 20 MG tablet TK 1 T PO QD    . PROLENSA 0.07 % SOLN INT 1 GTT IN OS HS  6  . ramipril (ALTACE) 5 MG capsule Take 5 mg by mouth 2 (two) times daily.     Marland Kitchen SHINGRIX injection   0  . sildenafil (REVATIO) 20 MG tablet Take 20 mg by mouth as needed (take 2-5 tablets as needed for erections).    Marland Kitchen buPROPion (WELLBUTRIN XL) 150 MG 24 hr tablet TAKE 3 TABLETS(450 MG) BY MOUTH DAILY 270 tablet 1  . escitalopram (LEXAPRO) 10 MG tablet Take  1 tablet (10 mg total) by mouth daily. Take 1 tablet at night 90 tablet 1  . methylphenidate (RITALIN) 10 MG tablet Take 1 tablet (10 mg total) by mouth 4 (four) times daily. 120 tablet 0  . [START ON 09/23/2020] methylphenidate (RITALIN) 10 MG tablet Take 1 tablet (10 mg total) by mouth in the morning, at noon, in the evening, and at bedtime. 120 tablet 0  . [START ON 10/21/2020] methylphenidate (RITALIN) 10 MG tablet Take 1 tablet (10 mg total) by mouth in the morning, at noon, in the evening, and at bedtime. 30 tablet 0   No current facility-administered medications for this visit.    Medication Side Effects: None  Allergies: No Known Allergies  Past Medical History:  Diagnosis Date  . Anxiety   . Arthritis   . Cancer ([place])    skin CA removed  . Depression   . Diabetes mellitus   . Diabetes mellitus without complication (HCC)    Type 1  . Elevated PSA 11/2014   PCP ordered for patient to take Cipro for 21 days  . Erectile dysfunction   . Hypercholesteremia   . Skin cancer of face   . Sleep apnea    wears CPAP nightly    Family History  Problem Relation Age of Onset  .  Cancer Father        Esophageal/gastric cancer  . Cancer Mother        Metastatic cancer  . Rheum arthritis Mother   . Jermaine cancer Neg Hx   . Pancreatic cancer Neg Hx   . Stomach cancer Neg Hx     Social History   Socioeconomic History  . Marital status: Married    Spouse name: Not on file  . Number of children: 2  . Years of education: Not on file  . Highest education level: Not on file  Occupational History  . Occupation: Retired    Fish farm manager: [employer]  Tobacco Use  . Smoking status: Former Smoker    Packs/day: 2.00    Years: 15.00    Pack years: 30.00    Types: Cigarettes    Quit date: 1981    Years since quitting: 40.7  . Smokeless tobacco: Never Used  Substance and Sexual Activity  . Alcohol use: No    Comment: 1986  . Drug use: No  . Sexual activity: Not on file  Other Topics Concern  . Not on file  Social History Narrative    Merged History Encounter        Social Determinants of Health   Financial Resource Strain:   . Difficulty of Paying Living Expenses: Not on file  Food Insecurity:   . Worried About Charity fundraiser in the Last Year: Not on file  . Ran Out of Food in the Last Year: Not on file  Transportation Needs:   . Lack of Transportation (Medical): Not on file  . Lack of Transportation (Non-Medical): Not on file  Physical Activity:   . Days of Exercise per Week: Not on file  . Minutes of Exercise per Session: Not on file  Stress:   . Feeling of Stress : Not on file  Social Connections:   . Frequency of Communication with Friends and Family: Not on file  . Frequency of Social Gatherings with Friends and Family: Not on file  . Attends Religious Services: Not on file  . Active Member of Clubs or Organizations: Not on file  . Attends Club or  Organization Meetings: Not on file  . Marital Status: Not on file  Intimate Partner Violence:   . Fear of Current or Ex-Partner: Not on file  . Emotionally Abused: Not on file  . Physically  Abused: Not on file  . Sexually Abused: Not on file    Past Medical History, Surgical history, Social history, and Family history were reviewed and updated as appropriate.   Please see review of systems for further details on the patient's review from today.   Objective:   Physical Exam:  There were no vitals taken for this visit.  Physical Exam Neurological:     Mental Status: He is alert and oriented to person, place, and time.     Cranial Nerves: No dysarthria.  Psychiatric:        Attention and Perception: Attention and perception normal.        Mood and Affect: Mood normal. Mood is not anxious or depressed.        Speech: Speech normal.        Behavior: Behavior is cooperative.        Thought Content: Thought content normal. Thought content is not paranoid or delusional. Thought content does not include homicidal or suicidal ideation. Thought content does not include homicidal or suicidal plan.        Cognition and Memory: Cognition and memory normal.        Judgment: Judgment normal.     Comments: Insight intact     Lab Review:     Component Value Date/Time   NA 139 12/02/2014 0842   K 4.3 12/02/2014 0842   CL 106 12/02/2014 0842   CO2 25 12/02/2014 0842   GLUCOSE 223 (H) 12/02/2014 0842   BUN 24 (H) 12/02/2014 0842   CREATININE 0.87 12/02/2014 0842   CALCIUM 8.8 12/02/2014 0842   GFRNONAA 88 (L) 12/02/2014 0842   GFRAA >90 12/02/2014 0842       Component Value Date/Time   WBC 4.9 12/02/2014 0843   RBC 4.90 12/02/2014 0843   HGB 14.8 12/02/2014 0843   HCT 44.1 12/02/2014 0843   PLT 226 12/02/2014 0843   MCV 90.0 12/02/2014 0843   MCH 30.2 12/02/2014 0843   MCHC 33.6 12/02/2014 0843   RDW 13.1 12/02/2014 0843    No results found for: POCLITH, LITHIUM   No results found for: PHENYTOIN, PHENOBARB, VALPROATE, CBMZ   .res Assessment: Plan:    Jermaine Waters was seen today for follow-up, depression and anxiety.  Diagnoses and all orders for this  visit:  Depression, major, recurrent, in complete remission ([place]) -     buPROPion (WELLBUTRIN XL) 150 MG 24 hr tablet; TAKE 3 TABLETS(450 MG) BY MOUTH DAILY -     escitalopram (LEXAPRO) 10 MG tablet; Take 1 tablet (10 mg total) by mouth daily. Take 1 tablet at night  Sleep apnea with hypersomnolence -     methylphenidate (RITALIN) 10 MG tablet; Take 1 tablet (10 mg total) by mouth 4 (four) times daily. -     methylphenidate (RITALIN) 10 MG tablet; Take 1 tablet (10 mg total) by mouth in the morning, at noon, in the evening, and at bedtime. -     methylphenidate (RITALIN) 10 MG tablet; Take 1 tablet (10 mg total) by mouth in the morning, at noon, in the evening, and at bedtime.  Generalized anxiety disorder  Early onset dysthymia  Major depressive disorder, recurrent episode, in partial remission (HCC) -     buPROPion (WELLBUTRIN XL) 150 MG  24 hr tablet; TAKE 3 TABLETS(450 MG) BY MOUTH DAILY     Greater than 50% of 30 min My Chart time with patient was spent on counseling and coordination of care. We discussed When first seen he denied significant depressive symptoms although his wife thought he was depressed.  Later he acknowledges that he does have some depressive symptoms as noted.  He realizes he also needs therapy for this tendency to self sabotage and also for marital conflict that has resulted.   However he is much improved now without depression.  Of note wife has been travelling for 2 months and is returning soon  Good response to Wellbutrin XL 450 with improvement in sadness and outlook and energy.  Tolerating it.   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   Lexapro 10 mg nightly for anxiety.   Disc SE in detail and SSRI withdrawal sx.   After Visit Summary for patient specific  instructions  FU 4 to 5 months  Lynder Parents, MD, DFAPA   Future Appointments  Date Time Provider [place]  10/02/2020  7:30 AM Hayden Pedro, MD [facility] None  10/09/2020  8:15 AM March Rummage, Christian Mate, DPM [facility]    No orders of the defined types were placed in this encounter.   -------------------------------

## 2020-09-09 ENCOUNTER — Encounter: Payer: Self-pay | Admitting: Neurology

## 2020-10-01 ENCOUNTER — Telehealth: Payer: Self-pay

## 2020-10-01 NOTE — Telephone Encounter (Signed)
Renewal prior authorization for METHYLPHENIDATE 10 MG #120 approved effective 09/30/2020-09/30/2021 with [facility].

## 2020-10-02 ENCOUNTER — Other Ambulatory Visit: Payer: Self-pay

## 2020-10-02 ENCOUNTER — Encounter (INDEPENDENT_AMBULATORY_CARE_PROVIDER_SITE_OTHER): Payer: Medicare Other | Admitting: Ophthalmology

## 2020-10-02 DIAGNOSIS — E113591 Type 2 diabetes mellitus with proliferative diabetic retinopathy without macular edema, right eye: Secondary | ICD-10-CM | POA: Diagnosis not present

## 2020-10-02 DIAGNOSIS — H34812 Central retinal vein occlusion, left eye, with macular edema: Secondary | ICD-10-CM | POA: Diagnosis not present

## 2020-10-02 DIAGNOSIS — E11311 Type 2 diabetes mellitus with unspecified diabetic retinopathy with macular edema: Secondary | ICD-10-CM | POA: Diagnosis not present

## 2020-10-02 DIAGNOSIS — E113512 Type 2 diabetes mellitus with proliferative diabetic retinopathy with macular edema, left eye: Secondary | ICD-10-CM | POA: Diagnosis not present

## 2020-10-02 DIAGNOSIS — H43813 Vitreous degeneration, bilateral: Secondary | ICD-10-CM | POA: Diagnosis not present

## 2020-10-07 ENCOUNTER — Other Ambulatory Visit: Payer: Self-pay

## 2020-10-07 ENCOUNTER — Ambulatory Visit (INDEPENDENT_AMBULATORY_CARE_PROVIDER_SITE_OTHER): Payer: Medicare Other | Admitting: Neurology

## 2020-10-07 ENCOUNTER — Encounter: Payer: Self-pay | Admitting: Neurology

## 2020-10-07 VITALS — BP 115/60 | HR 72 | Ht 65.0 in | Wt 161.0 lb

## 2020-10-07 DIAGNOSIS — G4739 Other sleep apnea: Secondary | ICD-10-CM | POA: Diagnosis not present

## 2020-10-07 DIAGNOSIS — G4733 Obstructive sleep apnea (adult) (pediatric): Secondary | ICD-10-CM | POA: Diagnosis not present

## 2020-10-07 DIAGNOSIS — Z9989 Dependence on other enabling machines and devices: Secondary | ICD-10-CM

## 2020-10-07 NOTE — Patient Instructions (Signed)

## 2020-10-07 NOTE — Progress Notes (Signed)
SLEEP MEDICINE CLINIC   Provider:  Larey Seat, M D  Primary Care Physician:  Leanna Battles (Inactive)   Referring Provider: Dr. Alfredo Bach    Chief Complaint  Patient presents with  . Follow-up    RM 11, alone. Last seen 10/25/2019. Here for cpap f/u.     Brittin Janik is a 74 year old Caucasian, right-handed male, seen here on 10-07-2020, and he reports being in good health. He is a compliant CPAP user, fully vaccinated with Moderna. 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 He has remained highly compliant CPAP user but 100% of days 97% for over 4 consecutive hours of daily use.  Average use at time of 7 hours and 7 minutes each night was a minimum pressure of 7 and a maximum pressure of 17 cmH2O his residual AHI is only 1.7/h.  This speaks for an excellent resolution of his apnea he does have moderate air leakage and his 95th percentile pressure is 11.4 cmH2O well within his current settings.  He has slightly more central apneas and obstructive apneas among his residual.    The patient endorsed a higher rate of his depression scale at 8 out of 15 points this would be clinically relevant his fatigue score was endorsed at 27 points and his Epworth sleepiness score at 14 points out of 24.  The most sleepy situations are while sitting and reading and if lying down in the afternoon, these are not associated with a risk of driving while sleepy or ending up in embarrassing situations either professionally or socially.       Last seen here in a RV on 10-25-2019, [name] has followed [place] for a little over 2 years now after he transferred to CPAP care.  See below for his baseline study from 2012 under Dr. Baird Lyons.  In the meantime he has switched to an auto titration device with a minimum pressure of 7 and a maximum pressure of 17 cmH2O and 2 cm expiratory pressure relief.  His residual AHI is 1.7 the 95th  percentile pressure is 11.2 and well within the current pressure window.  He has used the machine 30 out of 30 days and 97% of the time over 4 hours with an average of 6 hours 56 minutes.  He does not have central apnea or Cheyne-Stokes respirations.  The last 2 visits in our office were through nurse practitioner Cecille Rubin who has meanwhile retired.  The patient's BMI is 25, and  he has normal blood pressure, a normal regular heart rate. He sleeps a little under 7 hours. He doesn't nap. He has no nocturia and is no longer snoring using a chin strap.     HPI:  [name] had several sleep studies in his lifetime, and he seek to originally help because he fell asleep at a stoplight.  He was immediately diagnosed with obstructive sleep apnea once tested and has been on CPAP for at least 2 decades. [name] last sleep study took place in 2012 on July 26 and was interpreted by Dr. Baird Lyons.  At the time he had endorsed the Epworth sleepiness score at 18 out of 24 points, his BMI was 28, and he was diagnosed with severe obstructive and central sleep apnea at an AHI of 48.6/hr.   He was also moderately loud snoring oxygen saturation dipped to a nadir of 80% SPO2.  He was titrated to  7 cmH2O on CPAP wearing a Mirage fullface mask with heated humidification, central apneas were emerging and the residual AHI was still 8.1/h of sleep but snoring was eliminated and oxygen saturation improved.  He also had a moderate degree of periodic limb movements 4.4 arousals per hour. Mr. Gaffin has been a highly compliant CPAP user and over the last 5 years may have missed 1 night, but his wife still reports that he has limb movements and seems to run in his sleep.  I was able to obtain a download.  The patient uses an AutoSet between 8 and 15 cmH2O was 2 cm expiratory pressure relief, is 100% compliant, average daily use is 6 hours and 40 minutes, for 1 week in mid January he used a new facemask and only in that week  had significant air leaks.  The 95th percentile pressure for this patient is 14.3 his residual AHI for this months was still 8.2/h most of these apneas according to the CPAP download were obstructive and not central in nature. Obstructive apneas respond to higher pressures, white central apneas are reduced with reduction in pressure.  Also his sleep study placed 6-1/2 years ago, he has a current CPAP machine that is only about 72 years old.  He needs to continue getting supplies for changing or transferring sleep care means that we need to obtain the patient's original sleep test and pretest office notes.  Sleep habits are as follows:  The patient aims for a bedtime between 1030 and 11 PM, and once in bed is promptly asleep.  Bedroom is described as cool, quiet and dark.  He shares the bed and bedroom with his wife, the patient sleeps on his back on one pillow in a non-adjustable bed.He does not have nocturia every night, he does no longer snore since he wears a chin strap now that he was fitted with a nasal pillow- he has a beard. He is not a dream a or at least does not recall dreaming, and reports no sleepwalking episodes of parasomnia. Since retirement he may sleep a little longer than he used to -used to rise at 6 AM, now between 6.30 and 7 AM. After a good night sleep he feels " fully charged and ready to go "-he feels best after at least 7 hours of nocturnal sleep. He does not take naps, as these have left him more groggy and wishing for more sleep rather than being power naps.  Sleep medical history and family sleep history: [name] parents were to his knowledge not by any sleep dysfunction, his siblings neither. There is no history of childhood sleep disorders, enuresis, terrors etc.   The patient's past medical history :  he has been diagnosed with diabetes type 1, control has been at times poorly, has developed diabetic neuropathy, retinopathy, carpal tunnel syndrome, ADD and depression,  hyperlipidemia, and [place] disease.  And atypical chest pain in 2006 was worked up to be of cardiac origin.  Depression has been well controlled on Zoloft, he has undergone cataract surgery left eye Avastin injections and laser treatments of both eyes for diabetic retinopathy. Further surgical treatments include a cholecystectomy and appendectomy in 1980, rotator cuff repair on the right in 2014, septoplasty nasi in 2016, various laser treatments throughout the years 2013 and 2016 as well as cataracts for the left eye 2018 and right eye 2018 about , 6 month apart.   Social history: married , daughter is 92 and son is 78.  The patient  smoked for about 15 years until he quit at age 61. No tobacco use since, he quit smoking 5 years before he quit drinking alcohol.  He drinks about 6 cups of coffee a day, no energy drinks, no sodas and no iced tea or hot tea. He has no shift work history.  He likes to sing and bicycles.  Review of Systems: Out of a complete 14 system review, the patient complains of only the following symptoms, and all other reviewed systems are negative.  PLMs but no arousals, no EDS, Not fatigued.  He endorsed snoring, feeling cold, a tendency to bruise easily, dry skin with itching, and some restless legs at night.  How likely are you to doze in the following situations: 0 = not likely, 1 = slight chance, 2 = moderate chance, 3 = high chance  Sitting and Reading? 1 Watching Television? 1 Sitting inactive in a public place (theater or meeting)? Lying down in the afternoon when circumstances permit? Sitting and talking to someone? Sitting quietly after lunch without alcohol?2 In a car, while stopped for a few minutes in traffic?1 As a passenger in a car for an hour without a break? 2  Total = much higher tahn last year from 7 to 14 points.   Fatigue severity score 17/ 63   , geriatric depression score 7/ 15   60 years on insulin (!)  DM 1.  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 Marital status: Married    Spouse name: Not on file  . Number of children: 2  . Years of education: Not on file  . Highest education level: Not on file  Occupational History  . Occupation: Retired    Fish farm manager: [employer]  Tobacco Use  . Smoking status: Former Smoker    Packs/day: 2.00    Years: 15.00    Pack years: 30.00    Types: Cigarettes    Quit date: 1981    Years since quitting: 40.8  . Smokeless tobacco: Never Used  Substance and Sexual Activity  . Alcohol use: No    Comment: 1986  . Drug use: No  . Sexual activity: Not on file  Other Topics Concern  . Not on file  Social History Narrative    Merged History Encounter        Social Determinants of Health   Financial Resource Strain:   . Difficulty of Paying Living Expenses: Not on file  Food Insecurity:   . Worried About Charity fundraiser in the Last Year: Not on file  . Ran Out of Food in the Last Year: Not on file  Transportation Needs:   . Lack of Transportation (Medical): Not on file  . Lack of Transportation (Non-Medical): Not on file  Physical Activity:   . Days of Exercise per Week: Not on file  . Minutes of Exercise per Session: Not on file  Stress:   . Feeling of Stress : Not on file  Social Connections:   . Frequency of Communication with Friends and Family: Not on file  . Frequency of Social Gatherings with Friends and Family: Not on file  . Attends Religious Services: Not on file  . Active Member of Clubs or Organizations: Not on file  . Attends Archivist Meetings: Not on file  . Marital Status: Not on file  Intimate Partner Violence:   . Fear of Current or Ex-Partner: Not on file  .  Emotionally Abused: Not on file  . Physically Abused: Not on file  . Sexually Abused: Not on file    Family History  Problem Relation Age of Onset  . Cancer Father        Esophageal/gastric cancer  . Cancer Mother         Metastatic cancer  . Rheum arthritis Mother   . Colon cancer Neg Hx   . Pancreatic cancer Neg Hx   . Stomach cancer Neg Hx     Past Medical History:  Diagnosis Date  . Anxiety   . Arthritis   . Cancer ([place])    skin CA removed  . Depression   . Diabetes mellitus   . Diabetes mellitus without complication (HCC)    Type 1  . Elevated PSA 11/2014   PCP ordered for patient to take Cipro for 21 days  . Erectile dysfunction   . Hypercholesteremia   . Skin cancer of face   . Sleep apnea [facility], Dr. Brett Fairy.     wears CPAP nightly- autotitration 7- 17 cm water, EPR of 2.     Past Surgical History:  Procedure Laterality Date  . APPENDECTOMY    . CHOLECYSTECTOMY    . COLONOSCOPY    . EYE SURGERY Bilateral    laser  . ROTATOR CUFF REPAIR Right   . SEPTOPLASTY Bilateral 12/11/2014   Procedure: NASAL BILATERAL SEPTOPLASTY;  Surgeon: Jerrell Belfast, MD;  Location: [facility];  Service: ENT;  Laterality: Bilateral;  . SHOULDER ARTHROSCOPY WITH ROTATOR CUFF REPAIR AND SUBACROMIAL DECOMPRESSION Right 04/05/2013   Procedure: RIGHT SHOULDER ARTHROSCOPY SUBACROMIAL DECOMPRESSION DISTAL CLAVICLE RESECTION AND ROTATOR CUFF REPAIR ;  Surgeon: Marin Shutter, MD;  Location: [facility];  Service: Orthopedics;  Laterality: Right;  . TONSILLECTOMY    . TURBINATE REDUCTION N/A 12/11/2014   Procedure: INFERIOR TURBINATE REDUCTION;  Surgeon: Jerrell Belfast, MD;  Location: [facility];  Service: ENT;  Laterality: N/A;    Current Outpatient Medications  Medication Sig Dispense Refill  . amLODipine (NORVASC) 2.5 MG tablet TK 1 T PO QD  12  . buPROPion (WELLBUTRIN XL) 150 MG 24 hr tablet TAKE 3 TABLETS(450 MG) BY MOUTH DAILY 270 tablet 1  . CONTOUR NEXT TEST test strip     . Cyanocobalamin (VITAMIN B 12) 500 MCG TABS Take 2,000 Units by mouth.    . escitalopram (LEXAPRO) 10 MG tablet Take 1 tablet (10 mg total) by mouth daily. Take 1 tablet at night 90 tablet 1  . magnesium 30 MG tablet Take 30 mg by mouth 2  (two) times daily.    . methylphenidate (RITALIN) 10 MG tablet Take 1 tablet (10 mg total) by mouth 4 (four) times daily. 120 tablet 0  . NOVOLOG 100 UNIT/ML injection U IN INSULIN PUMP .APPROXIMATELY 80 UNITS A DAY  6  . pravastatin (PRAVACHOL) 20 MG tablet TK 1 T PO QD    . PROLENSA 0.07 % SOLN INT 1 GTT IN OS HS  6  . ramipril (ALTACE) 5 MG capsule Take 5 mg by mouth 2 (two) times daily.     Marland Kitchen SHINGRIX injection   0  . sildenafil (REVATIO) 20 MG tablet Take 20 mg by mouth as needed (take 2-5 tablets as needed for erections).     No current facility-administered medications for this visit.    Allergies as of 10/07/2020  . (No Known Allergies)    Vitals: BP 115/60 (BP Location: Right Arm, Patient Position: Sitting, Cuff Size: Normal)  Pulse 72   Ht 5\' 5"  (1.651 m)   Wt 161 lb (73 kg)   SpO2 97%   BMI 26.79 kg/m  Last Weight:  Wt Readings from Last 1 Encounters:  10/07/20 161 lb (73 kg)   FHL:KTGY mass index is 26.79 kg/m.     Last Height:   Ht Readings from Last 1 Encounters:  10/07/20 5\' 5"  (1.651 m)    Physical exam:  General: The patient is awake, alert and appears not in acute distress. The patient is well groomed. Facial hair- mustache. Head: Normocephalic, atraumatic.  Neck is supple. Mallampati 3- partial uvula lift. ,  neck circumference: 15.5 . Nasal airflow patent , TMJ is not evident. Retrognathia is not seen.  Cardiovascular:  Regular rate and rhythm , without  murmurs or carotid bruit, and without distended neck veins. Respiratory: Lungs are clear to auscultation. Skin:  Without evidence of edema, or rash Trunk: BMI is 26.8. The patient's posture is normal.  Neurologic exam : The patient is awake and alert, oriented to place and time.   Attention span & concentration ability appears normal during this visit.Marland Kitchen  Speech is fluent,  without dysarthria, dysphonia or aphasia.  Mood and affect are appropriate.  Cranial nerves: Intact sense of smell and  taste.  Pupils are sluggishly  reactive to light. Funduscopic exam deferred,  scars- Extraocular movements  in vertical and horizontal planes intact and without nystagmus. Visual fields by finger perimetry are intact. Hearing to finger rub intact. Facial sensation intact to fine touch.Facial motor strength is symmetric and tongue and uvula move midline. Shoulder shrug was symmetrical.  Motor exam:  Normal tone, muscle bulk and symmetric strength in all extremities. Gait and station: Patient walks without assistive device  .Deep tendon reflexes: in the  upper and lower extremities are symmetric / intact.    Assessment:  After physical and neurologic examination, review of laboratory studies,  Personal review of imaging studies, reports of other /same  Imaging studies, results of polysomnography and / or neurophysiology testing and pre-existing records as far as provided in visit., my assessment is   1) Mr. Shearer has carried a diagnosis of obstructive sleep apnea for almost 2 decades, his last sleep study at [facility] in the year 2012 however spoke of a complex sleep apnea with central apneas arising under CPAP treatment.    He has always slept better with CPAP than without, he has made a significant difference in fatigue, excessive daytime sleepiness the quality of sleep overall. Transferring sleep care to our office is certainly possible, his durable medical equipment company is [place].  Current new CPAP machine works well pressure on his current AutoSet beginning at 7 cmH2O minimum pressure and maximum pressure to be elevated to 17 cmH2O, with 2 cm EPR.  His compliance is excellent.  I would like to see the patient in 12 months.    He is due for a new machine - HST can confirm his baseline. He s using nasal pillows.    The patient was advised of the nature of the diagnosed disorder, the treatment options and the  risks for general health and wellness arising from not treating the condition.    I spent more than 21 minutes of face to face time with the patient. Greater than 50% of time was spent in counseling and coordination of care. We have discussed the diagnosis and differential and I answered the patient's questions.     He asked about hypoglossal CN stimulator,  INSPIRE procedure - He has not been evaluated for baseline apnea type and grade since 2012.  If he decides to follow up for INSPIRE, we will need to order an attended sleep study first, then , based on the results, make the referral to ENT.    Larey Seat, MD 29/03/7653, 65:03 AM  Certified in Neurology by ABPN Certified in [place] by [facility] [address], [place], [place] [address]

## 2020-10-09 ENCOUNTER — Encounter: Payer: Self-pay | Admitting: Podiatry

## 2020-10-09 ENCOUNTER — Ambulatory Visit (INDEPENDENT_AMBULATORY_CARE_PROVIDER_SITE_OTHER): Payer: Medicare Other | Admitting: Podiatry

## 2020-10-09 ENCOUNTER — Other Ambulatory Visit: Payer: Self-pay

## 2020-10-09 DIAGNOSIS — B351 Tinea unguium: Secondary | ICD-10-CM | POA: Diagnosis not present

## 2020-10-09 DIAGNOSIS — L84 Corns and callosities: Secondary | ICD-10-CM

## 2020-10-09 DIAGNOSIS — M79671 Pain in right foot: Secondary | ICD-10-CM

## 2020-10-09 DIAGNOSIS — E119 Type 2 diabetes mellitus without complications: Secondary | ICD-10-CM | POA: Diagnosis not present

## 2020-10-09 MED ORDER — CICLOPIROX 8 % EX SOLN: Freq: Every day | CUTANEOUS | 2 refills | Status: DC

## 2020-10-09 NOTE — Progress Notes (Signed)
  Subjective:  Patient ID: Jermaine Waters, male    DOB: 1946-01-26,  MRN: [id number]  Chief Complaint  Patient presents with  . foot exam    annual foot exam     74 y.o. male presents with the above complaint. History confirmed with patient.  He normally sees Dr. March Rummage annually for diabetic foot examination as well as mycotic toenails and painful callus on the right foot  Objective:  Physical Exam: warm, good capillary refill, no trophic changes or ulcerative lesions, normal DP and PT pulses and normal sensory exam.  On the left foot hallux nail there is onychomycosis with subungual debris and yellow discoloration of the distal lateral toenail.  Mild tenderness on palpation.  The right foot submet 5 has a painful callus. Assessment:   1. Well controlled type 2 diabetes mellitus ([place])   2. Callus of foot   3. Onychomycosis   4. Pain in right foot      Plan:  Patient was evaluated and treated and all questions answered.  Patient educated on diabetes. Discussed proper diabetic foot care and discussed risks and complications of disease. Educated patient in depth on reasons to return to the office immediately should he/she discover anything concerning or new on the feet. All questions answered. Discussed proper shoes as well.   Discussed the etiology and treatment options for the condition in detail with the patient. Educated patient on the topical and oral treatment options for mycotic nails. Recommended debridement of the nails today. Sharp and mechanical debridement performed of all painful and mycotic nails today. Nails debrided in length and thickness using a nail nipper and a mechanical burr to level of comfort. Discussed treatment options including appropriate shoe gear. Follow up as needed for painful nails.  Rx for Penlac sent to his pharmacy  All symptomatic hyperkeratoses were safely debrided with a sterile #15 blade to patient's level of comfort without incident. We discussed  preventative and palliative care of these lesions including supportive and accommodative shoegear, padding, prefabricated and custom molded accommodative orthoses, use of a pumice stone and lotions/creams daily.   Return in about 1 year (around 10/09/2021).

## 2020-10-09 NOTE — Patient Instructions (Signed)
Diabetes Mellitus and Foot Care Foot care is an important part of your health, especially when you have diabetes. Diabetes may cause you to have problems because of poor blood flow (circulation) to your feet and legs, which can cause your skin to:  Become thinner and drier.  Break more easily.  Heal more slowly.  Peel and crack. You may also have nerve damage (neuropathy) in your legs and feet, causing decreased feeling in them. This means that you may not notice minor injuries to your feet that could lead to more serious problems. Noticing and addressing any potential problems early is the best way to prevent future foot problems. How to care for your feet Foot hygiene  Wash your feet daily with warm water and mild soap. Do not use hot water. Then, pat your feet and the areas between your toes until they are completely dry. Do not soak your feet as this can dry your skin.  Trim your toenails straight across. Do not dig under them or around the cuticle. File the edges of your nails with an emery board or nail file.  Apply a moisturizing lotion or petroleum jelly to the skin on your feet and to dry, brittle toenails. Use lotion that does not contain alcohol and is unscented. Do not apply lotion between your toes. Shoes and socks  Wear clean socks or stockings every day. Make sure they are not too tight. Do not wear knee-high stockings since they may decrease blood flow to your legs.  Wear shoes that fit properly and have enough cushioning. Always look in your shoes before you put them on to be sure there are no objects inside.  To break in new shoes, wear them for just a few hours a day. This prevents injuries on your feet. Wounds, scrapes, corns, and calluses  Check your feet daily for blisters, cuts, bruises, sores, and redness. If you cannot see the bottom of your feet, use a mirror or ask someone for help.  Do not cut corns or calluses or try to remove them with medicine.  If you  find a minor scrape, cut, or break in the skin on your feet, keep it and the skin around it clean and dry. You may clean these areas with mild soap and water. Do not clean the area with peroxide, alcohol, or iodine.  If you have a wound, scrape, corn, or callus on your foot, look at it several times a day to make sure it is healing and not infected. Check for: ? Redness, swelling, or pain. ? Fluid or blood. ? Warmth. ? Pus or a bad smell. General instructions  Do not cross your legs. This may decrease blood flow to your feet.  Do not use heating pads or hot water bottles on your feet. They may burn your skin. If you have lost feeling in your feet or legs, you may not know this is happening until it is too late.  Protect your feet from hot and cold by wearing shoes, such as at the beach or on hot pavement.  Schedule a complete foot exam at least once a year (annually) or more often if you have foot problems. If you have foot problems, report any cuts, sores, or bruises to your health care provider immediately. Contact a health care provider if:  You have a medical condition that increases your risk of infection and you have any cuts, sores, or bruises on your feet.  You have an injury that is not   healing.  You have redness on your legs or feet.  You feel burning or tingling in your legs or feet.  You have pain or cramps in your legs and feet.  Your legs or feet are numb.  Your feet always feel cold.  You have pain around a toenail. Get help right away if:  You have a wound, scrape, corn, or callus on your foot and: ? You have pain, swelling, or redness that gets worse. ? You have fluid or blood coming from the wound, scrape, corn, or callus. ? Your wound, scrape, corn, or callus feels warm to the touch. ? You have pus or a bad smell coming from the wound, scrape, corn, or callus. ? You have a fever. ? You have a red line going up your leg. Summary  Check your feet every day  for cuts, sores, red spots, swelling, and blisters.  Moisturize feet and legs daily.  Wear shoes that fit properly and have enough cushioning.  If you have foot problems, report any cuts, sores, or bruises to your health care provider immediately.  Schedule a complete foot exam at least once a year (annually) or more often if you have foot problems. This information is not intended to replace advice given to you by your health care provider. Make sure you discuss any questions you have with your health care provider. Document Revised: 08/15/2019 Document Reviewed: 12/24/2016 Elsevier Patient Education  2020 [employer]  

## 2020-10-22 DIAGNOSIS — I10 Essential (primary) hypertension: Secondary | ICD-10-CM | POA: Diagnosis not present

## 2020-10-22 DIAGNOSIS — Z23 Encounter for immunization: Secondary | ICD-10-CM | POA: Diagnosis not present

## 2020-10-22 DIAGNOSIS — Z4681 Encounter for fitting and adjustment of insulin pump: Secondary | ICD-10-CM | POA: Diagnosis not present

## 2020-10-22 DIAGNOSIS — E10319 Type 1 diabetes mellitus with unspecified diabetic retinopathy without macular edema: Secondary | ICD-10-CM | POA: Diagnosis not present

## 2020-10-22 DIAGNOSIS — Z794 Long term (current) use of insulin: Secondary | ICD-10-CM | POA: Diagnosis not present

## 2020-11-03 ENCOUNTER — Ambulatory Visit (INDEPENDENT_AMBULATORY_CARE_PROVIDER_SITE_OTHER): Payer: Medicare Other | Admitting: Neurology

## 2020-11-03 DIAGNOSIS — G4733 Obstructive sleep apnea (adult) (pediatric): Secondary | ICD-10-CM | POA: Diagnosis not present

## 2020-11-03 DIAGNOSIS — G4739 Other sleep apnea: Secondary | ICD-10-CM
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
## 2020-11-12 ENCOUNTER — Telehealth: Payer: Self-pay

## 2020-11-12 DIAGNOSIS — G4733 Obstructive sleep apnea (adult) (pediatric): Secondary | ICD-10-CM | POA: Insufficient documentation

## 2020-11-12 NOTE — Telephone Encounter (Signed)
I called pt. I discussed his sleep study results and recommendations. Pt would like a new DME. I will send the order to [facility]. A follow up appt was made for 03/05/21 at 8:30am with Amy, NP. Pt verbalized understanding of results. Pt had no questions at this time but was encouraged to call back if questions arise.
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
## 2020-11-12 NOTE — Addendum Note (Signed)
Addended by: Larey Seat on: 11/12/2020 05:04 PM   Modules accepted: Orders

## 2020-11-12 NOTE — Progress Notes (Signed)
  Summary & Diagnosis:   This HST confirms the presence of severe OSA - the AHI here was 48/h, the REM AHI was 59/h. the patient spent 32.7 minutes in hypoxia and all night in supine position.     Recommendations:    This patient should continue CPAP therapy due to the high severity of OSA. The associated hypoxemia is less likely to respond to [facility] device.  We will reuse his last machine's settings of 7-17 cm water pressure and 3 cm EPR. He can continue to use a mask of his choice with heated humidification.  Interpreting Physician: Larey Seat, MD  Cc Dr Arnetha Massy

## 2020-11-12 NOTE — Telephone Encounter (Signed)
-----   Message from Larey Seat, MD sent at 11/12/2020  5:04 PM EST -----  Summary & Diagnosis:   This HST confirms the presence of severe OSA - the AHI here was 48/h, the REM AHI was 59/h. the patient spent 32.7 minutes in hypoxia and all night in supine position.     Recommendations:    This patient should continue CPAP therapy due to the high severity of OSA. The associated hypoxemia is less likely to respond to [facility] device.  We will reuse his last machine's settings of 7-17 cm water pressure and 3 cm EPR. He can continue to use a mask of his choice with heated humidification.  Interpreting Physician: Larey Seat, MD  Cc Dr Arnetha Massy

## 2020-11-13 ENCOUNTER — Encounter: Payer: Self-pay | Admitting: Neurology

## 2020-11-16 IMAGING — MR MR HEAD WO/W CM
8 of 13 series · 33 of 48 positions shown · IV contrast (Yes)
Comparison: None.

CLINICAL DATA: Macular edema of the left eye. Headaches.

EXAM:
MRI HEAD WITHOUT AND WITH CONTRAST
TECHNIQUE: Multiplanar, multiecho pulse sequences of the brain and surrounding
structures were obtained without and with intravenous contrast.
CONTRAST:  7mL GADAVIST GADOBUTROL 1 MMOL/ML IV SOLN

[Series 3: DWI · axial · 3.0mm · 1.09mm/px · z∈[-26,+118]mm · 7 of 100 slices shown (1 of 4)]
[im 1/100]
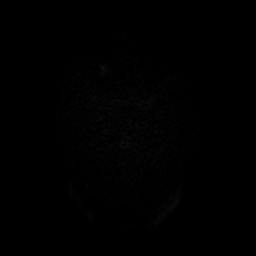
[im 17/100]
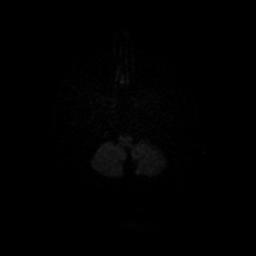
[im 34/100]
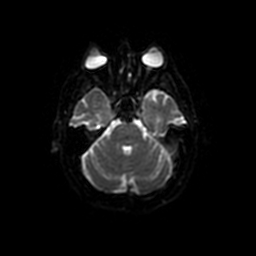
[im 50/100]
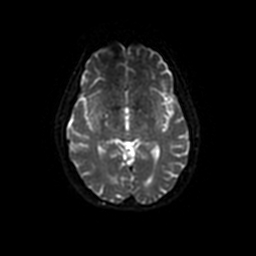
[im 67/100]
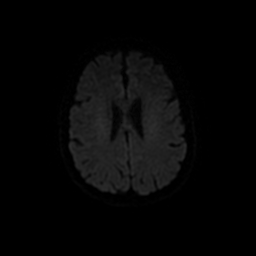
[im 83/100]
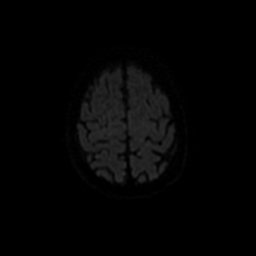
[im 100/100]
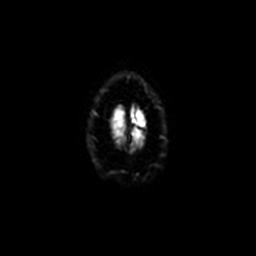

[Series 6: FLAIR · axial · 3.0mm · 0.43mm/px · z∈[-29,+117]mm · 2 of 26 slices shown]
[im 1/26]
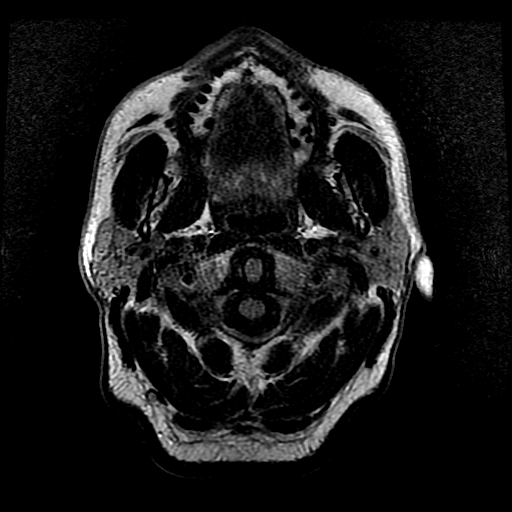
[im 26/26]
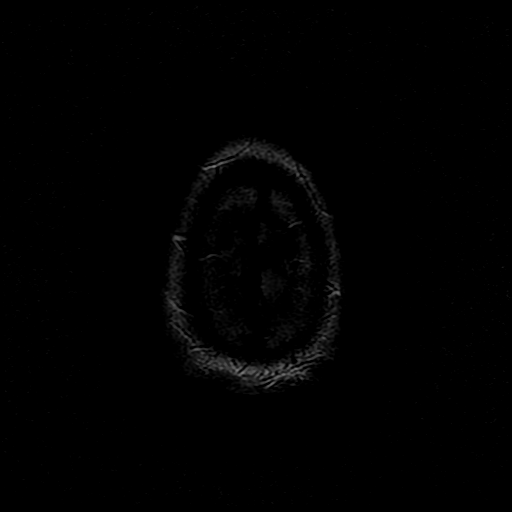

[Series 9: DWI · coronal · 4.0mm · 1.09mm/px · 6 of 84 slices shown (2 of 4)]
[im 1/84]
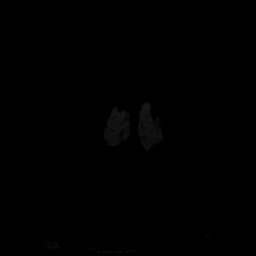
[im 17/84]
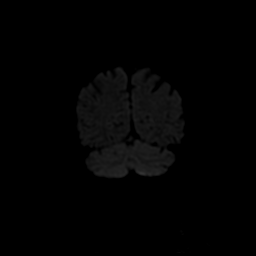
[im 34/84]
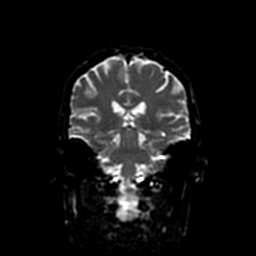
[im 50/84]
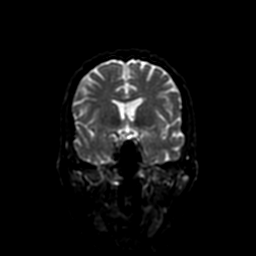
[im 67/84]
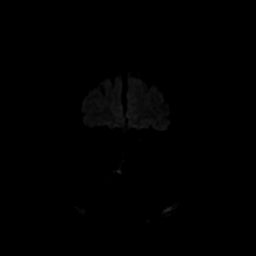
[im 84/84]
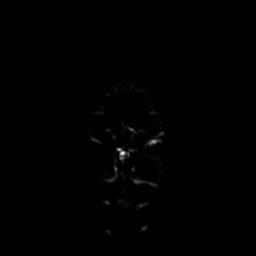

[Series 11: T1 post-contrast · axial · 1.8mm · 0.47mm/px · z∈[-29,+121]mm · 7 of 86 slices shown (1 of 3)]
[im 1/86]
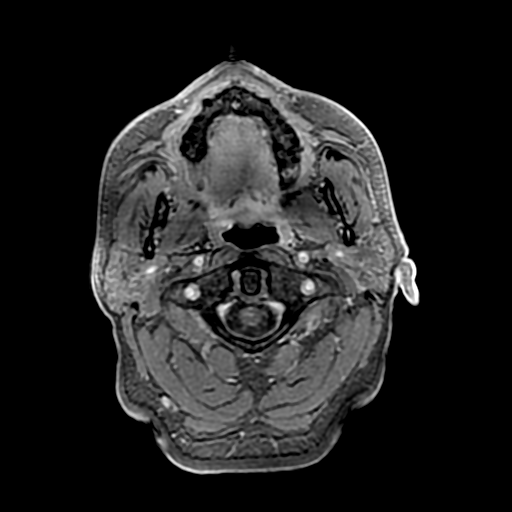
[im 15/86]
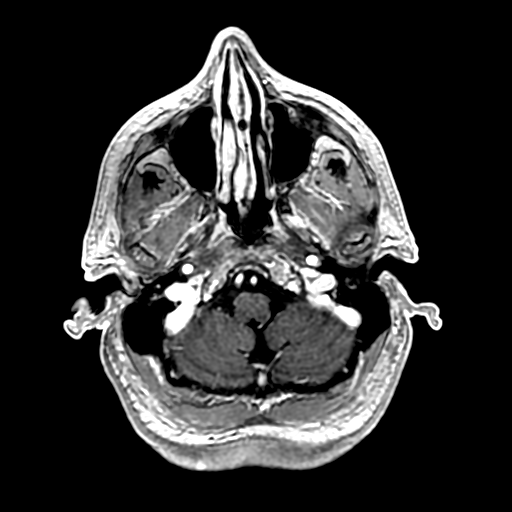
[im 29/86]
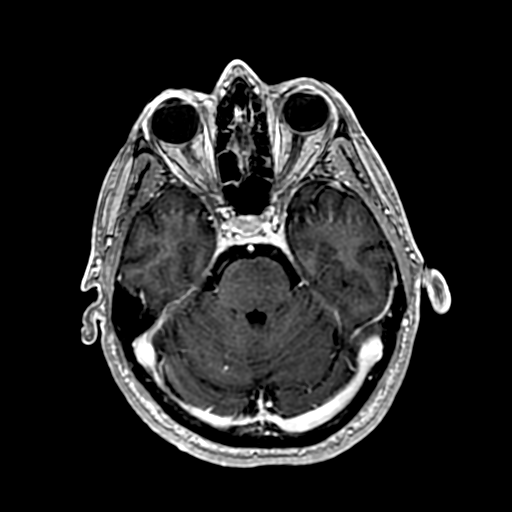
[im 43/86]
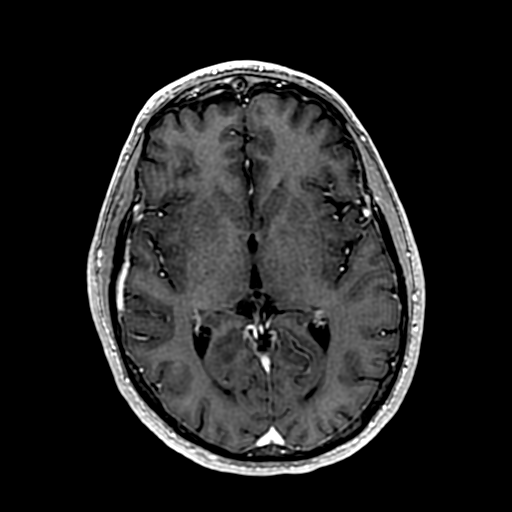
[im 57/86]
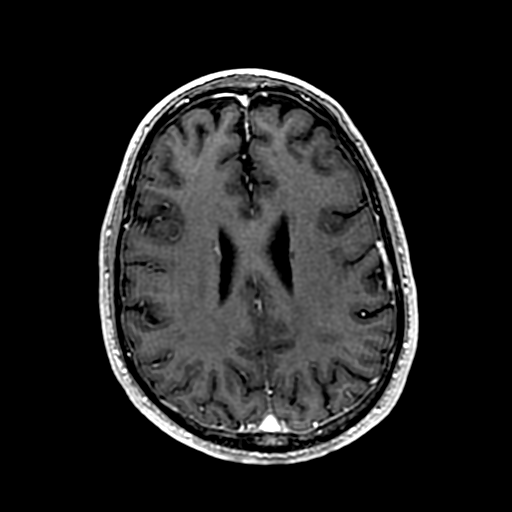
[im 71/86]
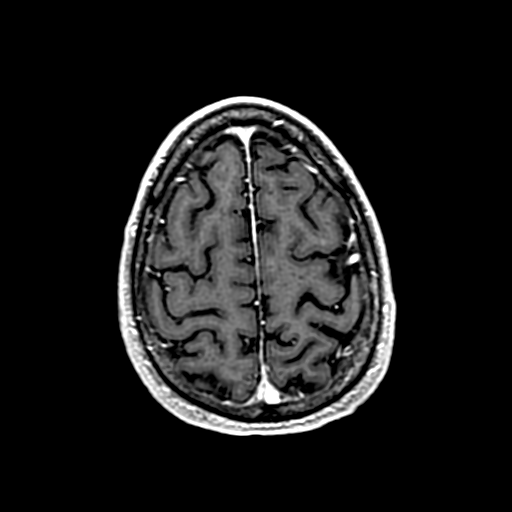
[im 86/86]
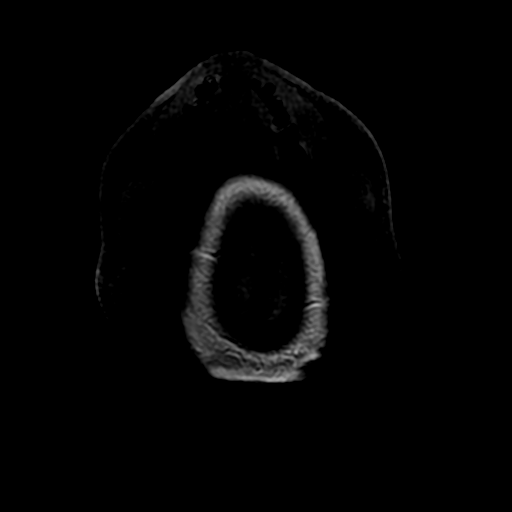

[Series 12: T1 post-contrast · coronal · 5.0mm · 0.47mm/px · 2 of 26 slices shown (2 of 3)]
[im 1/26]
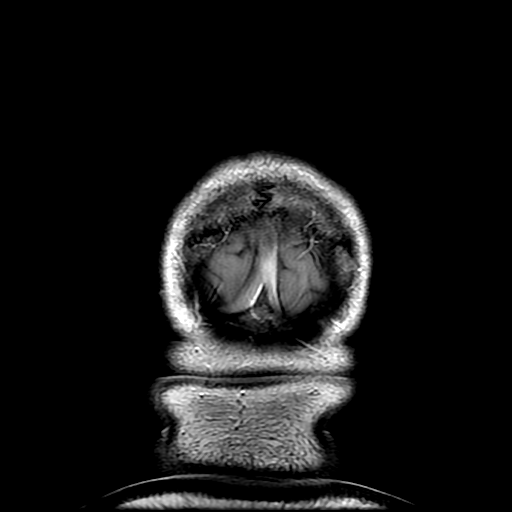
[im 26/26]
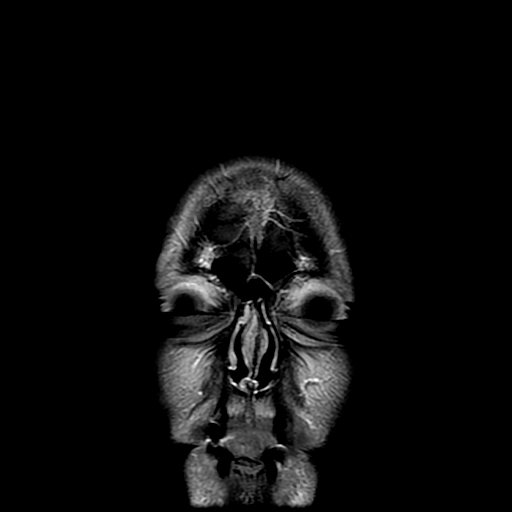

[Series 13: T1 post-contrast · sagittal · 5.0mm · 0.47mm/px · 2 of 22 slices shown (3 of 3)]
[im 1/22]
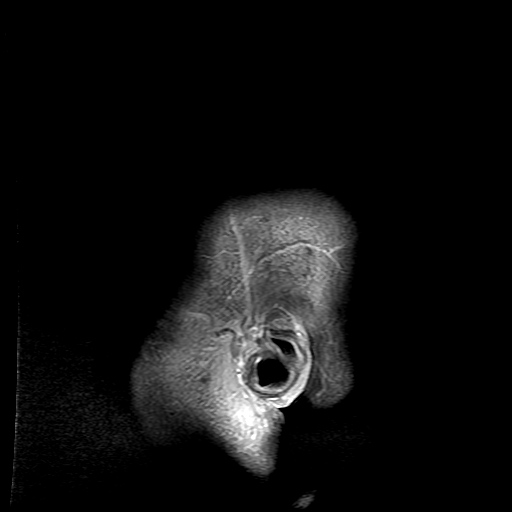
[im 22/22]
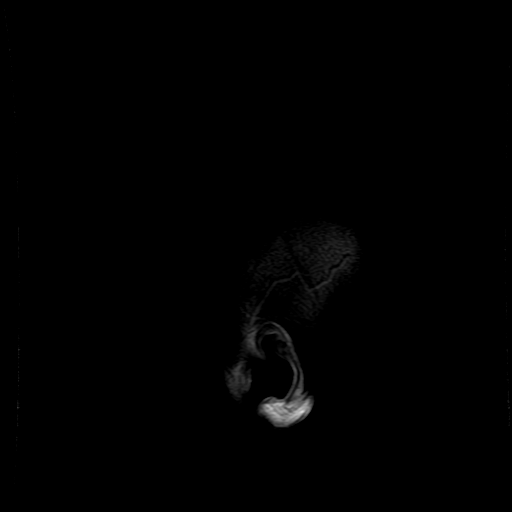

[Series 300: DWI · axial · 3.0mm · 1.09mm/px · z∈[-26,+118]mm · 4 of 50 slices shown (3 of 4)]
[im 1/50]
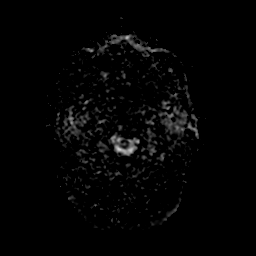
[im 17/50]
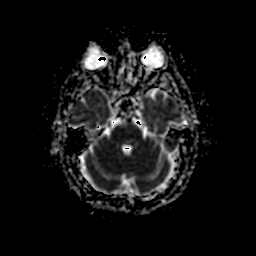
[im 33/50]
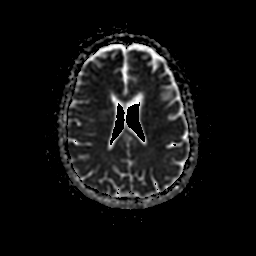
[im 50/50]
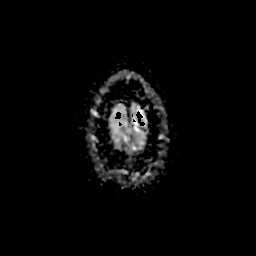

[Series 900: DWI · coronal · 4.0mm · 1.09mm/px · 3 of 42 slices shown (4 of 4)]
[im 1/42]
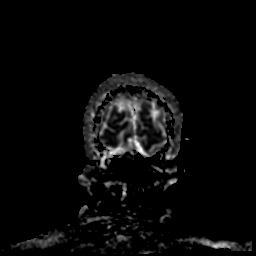
[im 21/42]
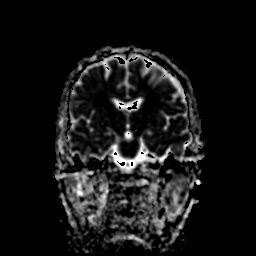
[im 42/42]
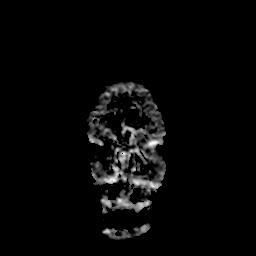

[33 of 48 positions shown; findings below may reference images not displayed]

FINDINGS: Brain: No acute infarct, hemorrhage, or mass lesion is present. Mild
periventricular and scattered subcortical T2 hyperintensities are
within normal limits for age. The ventricles are of normal size. No
significant extraaxial fluid collection is present.

The brainstem and cerebellum are within normal limits.

Vascular: Flow is present in the major intracranial arteries.

Skull and upper cervical spine: The craniocervical junction is
normal. Upper cervical spine is within normal limits. Marrow signal
is unremarkable.

Sinuses/Orbits: Polyps or mucous retention cysts. Are present in the
left maxillary sinus mild diffuse mucosal thickening is present left
maxillary sinus. There is scattered mucosal thickening in the
ethmoid air cells and left frontal sinus. No fluid levels are
present. The mastoid air cells are clear.

Bilateral lens replacements are noted. Globes and orbits are
otherwise unremarkable.
IMPRESSION: 1. Normal MRI appearance of the brain for age.
2. Mild sinus disease as described.

## 2020-11-17 DIAGNOSIS — D045 Carcinoma in situ of skin of trunk: Secondary | ICD-10-CM | POA: Diagnosis not present

## 2020-11-17 DIAGNOSIS — D2262 Melanocytic nevi of left upper limb, including shoulder: Secondary | ICD-10-CM | POA: Diagnosis not present

## 2020-11-17 DIAGNOSIS — L821 Other seborrheic keratosis: Secondary | ICD-10-CM | POA: Diagnosis not present

## 2020-11-17 DIAGNOSIS — D2239 Melanocytic nevi of other parts of face: Secondary | ICD-10-CM | POA: Diagnosis not present

## 2020-11-17 DIAGNOSIS — Z85828 Personal history of other malignant neoplasm of skin: Secondary | ICD-10-CM | POA: Diagnosis not present

## 2020-11-17 DIAGNOSIS — D485 Neoplasm of uncertain behavior of skin: Secondary | ICD-10-CM | POA: Diagnosis not present

## 2020-11-17 DIAGNOSIS — D1801 Hemangioma of skin and subcutaneous tissue: Secondary | ICD-10-CM | POA: Diagnosis not present

## 2020-11-17 DIAGNOSIS — D225 Melanocytic nevi of trunk: Secondary | ICD-10-CM | POA: Diagnosis not present

## 2020-11-17 DIAGNOSIS — L812 Freckles: Secondary | ICD-10-CM | POA: Diagnosis not present

## 2020-12-11 ENCOUNTER — Other Ambulatory Visit: Payer: Self-pay

## 2020-12-11 ENCOUNTER — Encounter (INDEPENDENT_AMBULATORY_CARE_PROVIDER_SITE_OTHER): Payer: Medicare Other | Admitting: Ophthalmology

## 2020-12-11 DIAGNOSIS — E113591 Type 2 diabetes mellitus with proliferative diabetic retinopathy without macular edema, right eye: Secondary | ICD-10-CM | POA: Diagnosis not present

## 2020-12-11 DIAGNOSIS — E113512 Type 2 diabetes mellitus with proliferative diabetic retinopathy with macular edema, left eye: Secondary | ICD-10-CM | POA: Diagnosis not present

## 2020-12-11 DIAGNOSIS — H43813 Vitreous degeneration, bilateral: Secondary | ICD-10-CM | POA: Diagnosis not present

## 2020-12-11 DIAGNOSIS — H34812 Central retinal vein occlusion, left eye, with macular edema: Secondary | ICD-10-CM

## 2020-12-31 ENCOUNTER — Encounter: Payer: Self-pay | Admitting: Psychiatry

## 2020-12-31 ENCOUNTER — Other Ambulatory Visit: Payer: Self-pay

## 2020-12-31 ENCOUNTER — Ambulatory Visit (INDEPENDENT_AMBULATORY_CARE_PROVIDER_SITE_OTHER): Payer: Medicare Other | Admitting: Psychiatry

## 2020-12-31 DIAGNOSIS — F341 Dysthymic disorder: Secondary | ICD-10-CM

## 2020-12-31 DIAGNOSIS — G4730 Sleep apnea, unspecified: Secondary | ICD-10-CM

## 2020-12-31 DIAGNOSIS — G4710 Hypersomnia, unspecified: Secondary | ICD-10-CM | POA: Diagnosis not present

## 2020-12-31 DIAGNOSIS — G4733 Obstructive sleep apnea (adult) (pediatric): Secondary | ICD-10-CM

## 2020-12-31 DIAGNOSIS — F411 Generalized anxiety disorder: Secondary | ICD-10-CM | POA: Diagnosis not present

## 2020-12-31 DIAGNOSIS — F3342 Major depressive disorder, recurrent, in full remission: Secondary | ICD-10-CM

## 2020-12-31 MED ORDER — ESCITALOPRAM OXALATE 5 MG PO TABS: 5.0000 mg | ORAL_TABLET | Freq: Every day | ORAL | 0 refills | Status: DC

## 2020-12-31 NOTE — Patient Instructions (Signed)
Reduce the Lexapro to 5 mg nightly to see if it is having a dulling effect
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
## 2021-01-06 ENCOUNTER — Other Ambulatory Visit: Payer: Self-pay

## 2021-01-06 ENCOUNTER — Encounter (INDEPENDENT_AMBULATORY_CARE_PROVIDER_SITE_OTHER): Payer: Self-pay | Admitting: Otolaryngology

## 2021-01-06 ENCOUNTER — Ambulatory Visit (INDEPENDENT_AMBULATORY_CARE_PROVIDER_SITE_OTHER): Payer: Medicare Other | Admitting: Otolaryngology

## 2021-01-06 VITALS — Temp 97.7°F

## 2021-01-06 DIAGNOSIS — R040 Epistaxis: Secondary | ICD-10-CM

## 2021-01-06 NOTE — Progress Notes (Signed)
HPI: Jermaine Waters is a 75 y.o. male who presents is referred by Dr. Linda Hedges for evaluation of recurrent right-sided epistaxis that he has had for several weeks.  His last nosebleed was last night.  He is on no blood thinners.  He also has a sensation of left ear being clogged like he is on a plane..  Past Medical History:  Diagnosis Date  . Anxiety   . Arthritis   . Cancer ([place])    skin CA removed  . Depression   . Diabetes mellitus   . Diabetes mellitus without complication (HCC)    Type 1  . Elevated PSA 11/2014   PCP ordered for patient to take Cipro for 21 days  . Erectile dysfunction   . Hypercholesteremia   . Skin cancer of face   . Sleep apnea    wears CPAP nightly   Past Surgical History:  Procedure Laterality Date  . APPENDECTOMY    . CHOLECYSTECTOMY    . COLONOSCOPY    . EYE SURGERY Bilateral    laser  . ROTATOR CUFF REPAIR Right   . SEPTOPLASTY Bilateral 12/11/2014   Procedure: NASAL BILATERAL SEPTOPLASTY;  Surgeon: Jerrell Belfast, MD;  Location: [place];  Service: ENT;  Laterality: Bilateral;  . SHOULDER ARTHROSCOPY WITH ROTATOR CUFF REPAIR AND SUBACROMIAL DECOMPRESSION Right 04/05/2013   Procedure: RIGHT SHOULDER ARTHROSCOPY SUBACROMIAL DECOMPRESSION DISTAL CLAVICLE RESECTION AND ROTATOR CUFF REPAIR ;  Surgeon: Marin Shutter, MD;  Location: [place];  Service: Orthopedics;  Laterality: Right;  . TONSILLECTOMY    . TURBINATE REDUCTION N/A 12/11/2014   Procedure: INFERIOR TURBINATE REDUCTION;  Surgeon: Jerrell Belfast, MD;  Location: [place];  Service: ENT;  Laterality: N/A;   Social History   Socioeconomic History  . Marital status: Married    Spouse name: Not on file  . Number of children: 2  . Years of education: Not on file  . Highest education level: Not on file  Occupational History  . Occupation: Retired    Fish farm manager: [employer]  Tobacco Use  . Smoking status: Former Smoker    Packs/day: 2.00    Years: 15.00    Pack years: 30.00    Types: Cigarettes     Quit date: 1981    Years since quitting: 41.1  . Smokeless tobacco: Never Used  Substance and Sexual Activity  . Alcohol use: No    Comment: 1986  . Drug use: No  . Sexual activity: Not on file  Other Topics Concern  . Not on file  Social History Narrative    Merged History Encounter        Social Determinants of Health   Financial Resource Strain: Not on file  Food Insecurity: Not on file  Transportation Needs: Not on file  Physical Activity: Not on file  Stress: Not on file  Social Connections: Not on file   Family History  Problem Relation Age of Onset  . Cancer Father        Esophageal/gastric cancer  . Cancer Mother        Metastatic cancer  . Rheum arthritis Mother   . Colon cancer Neg Hx   . Pancreatic cancer Neg Hx   . Stomach cancer Neg Hx    No Known Allergies Prior to Admission medications   Medication Sig Start Date End Date Taking? Authorizing Provider  amLODipine (NORVASC) 2.5 MG tablet TK 1 T PO QD 07/10/18   [provider]  amoxicillin (AMOXIL) 500 MG capsule Take by  mouth. 09/25/20   [provider]  B-12 TR 2000 MCG TBCR Take by mouth. 09/06/20   [provider]  buPROPion (WELLBUTRIN XL) 150 MG 24 hr tablet TAKE 3 TABLETS(450 MG) BY MOUTH DAILY 08/26/20   Cottle, Billey Co., MD  ciclopirox [facility]) 8 % solution Apply topically at bedtime. Apply over nail and surrounding skin. Apply daily over previous coat. After seven (7) days, may remove with alcohol and continue cycle. 10/09/20   Criselda Peaches, DPM  CONTOUR NEXT TEST test strip  09/26/19   [provider]  Cyanocobalamin (VITAMIN B 12) 500 MCG TABS Take 2,000 Units by mouth.    [provider]  doxycycline (VIBRAMYCIN) 100 MG capsule Take 100 mg by mouth daily. 04/16/20   [provider]  escitalopram (LEXAPRO) 5 MG tablet Take 1 tablet (5 mg total) by mouth daily. 12/31/20   Cottle, Billey Co., MD  magnesium 30 MG tablet Take 30 mg by mouth 2  (two) times daily.    [provider]  methylphenidate (RITALIN) 10 MG tablet Take 1 tablet (10 mg total) by mouth 4 (four) times daily. 08/26/20   Cottle, Billey Co., MD  NOVOLOG 100 UNIT/ML injection U IN INSULIN PUMP .APPROXIMATELY 80 UNITS A DAY 12/19/17   [provider]  pravastatin (PRAVACHOL) 20 MG tablet TK 1 T PO QD 05/13/19   [provider]  PROLENSA 0.07 % SOLN INT 1 GTT IN OS HS 08/08/18   [provider]  ramipril (ALTACE) 5 MG capsule Take 5 mg by mouth 2 (two) times daily.     [provider]  [facility] injection  10/26/17   [provider]  sildenafil (REVATIO) 20 MG tablet Take 20 mg by mouth as needed (take 2-5 tablets as needed for erections).    [provider]     Positive ROS: Otherwise negative  All other systems have been reviewed and were otherwise negative with the exception of those mentioned in the HPI and as above.  Physical Exam: Constitutional: Alert, well-appearing, no acute distress Ears: External ears without lesions or tenderness. Ear canals are clear bilaterally with intact, clear TMs bilaterally.  On tuning fork testing AC was greater than BC bilaterally with minimal hearing deficit. Nasal: External nose without lesions. Septum is slightly deviated to the left with a small 2 to 3 mm perforation along the inferior aspect of the septum.  This is otherwise clear.  Apparently patient has had previous septoplasty..  The site of epistaxis appears to be anterior inferior septum at the caudal edge.  This was cauterized using silver nitrate.  He had a fairly prominent vessel in this area that bled briskly. Oral: Lips and gums without lesions. Tongue and palate mucosa without lesions. Posterior oropharynx clear. Neck: No palpable adenopathy or masses Respiratory: Breathing comfortably  Skin: No facial/neck lesions or rash noted.  Control of epistaxis  Date/Time: 01/06/2021 6:49 PM Performed by: Rozetta Nunnery, MD Authorized by: Rozetta Nunnery, MD   Consent:    Consent obtained:  Verbal   Consent given by:  Patient   Risks discussed:  Bleeding and pain   Alternatives discussed:  No treatment and observation Procedure details:    Treatment site:  R anterior   Treatment method:  Silver nitrate   Treatment complexity:  Limited   Treatment episode: initial   Post-procedure details:    Assessment:  Bleeding stopped   Patient tolerance of procedure:  Tolerated well, no  immediate complications Comments:     Cauterization of anterior inferior septal vessel was performed with silver nitrate.    Assessment: Right sided epistaxis from anterior inferior septal vessel.  Plan: This was cauterized using silver nitrate. Reviewed with him concerning using a cottonball packing if he has any further nosebleeds. He will follow-up as needed.   Radene Journey, MD   CC:

## 2021-01-22 DIAGNOSIS — E785 Hyperlipidemia, unspecified: Secondary | ICD-10-CM | POA: Diagnosis not present

## 2021-01-22 DIAGNOSIS — R413 Other amnesia: Secondary | ICD-10-CM | POA: Diagnosis not present

## 2021-01-22 DIAGNOSIS — R5383 Other fatigue: Secondary | ICD-10-CM | POA: Diagnosis not present

## 2021-01-22 DIAGNOSIS — Z794 Long term (current) use of insulin: Secondary | ICD-10-CM | POA: Diagnosis not present

## 2021-01-22 DIAGNOSIS — E10319 Type 1 diabetes mellitus with unspecified diabetic retinopathy without macular edema: Secondary | ICD-10-CM | POA: Diagnosis not present

## 2021-01-22 DIAGNOSIS — I10 Essential (primary) hypertension: Secondary | ICD-10-CM | POA: Diagnosis not present

## 2021-01-22 DIAGNOSIS — Z4681 Encounter for fitting and adjustment of insulin pump: Secondary | ICD-10-CM | POA: Diagnosis not present

## 2021-01-22 DIAGNOSIS — Z125 Encounter for screening for malignant neoplasm of prostate: Secondary | ICD-10-CM | POA: Diagnosis not present

## 2021-01-29 ENCOUNTER — Encounter (INDEPENDENT_AMBULATORY_CARE_PROVIDER_SITE_OTHER): Payer: Medicare Other | Admitting: Ophthalmology

## 2021-01-29 ENCOUNTER — Other Ambulatory Visit: Payer: Self-pay

## 2021-01-29 DIAGNOSIS — E113591 Type 2 diabetes mellitus with proliferative diabetic retinopathy without macular edema, right eye: Secondary | ICD-10-CM | POA: Diagnosis not present

## 2021-01-29 DIAGNOSIS — H34812 Central retinal vein occlusion, left eye, with macular edema: Secondary | ICD-10-CM | POA: Diagnosis not present

## 2021-01-29 DIAGNOSIS — E113512 Type 2 diabetes mellitus with proliferative diabetic retinopathy with macular edema, left eye: Secondary | ICD-10-CM

## 2021-01-29 DIAGNOSIS — H43813 Vitreous degeneration, bilateral: Secondary | ICD-10-CM | POA: Diagnosis not present

## 2021-02-25 ENCOUNTER — Telehealth (INDEPENDENT_AMBULATORY_CARE_PROVIDER_SITE_OTHER): Payer: Medicare Other | Admitting: Psychiatry

## 2021-02-25 ENCOUNTER — Encounter: Payer: Self-pay | Admitting: Psychiatry

## 2021-02-25 DIAGNOSIS — F341 Dysthymic disorder: Secondary | ICD-10-CM | POA: Diagnosis not present

## 2021-02-25 DIAGNOSIS — F331 Major depressive disorder, recurrent, moderate: Secondary | ICD-10-CM

## 2021-02-25 DIAGNOSIS — G4730 Sleep apnea, unspecified: Secondary | ICD-10-CM

## 2021-02-25 DIAGNOSIS — Z630 Problems in relationship with spouse or partner: Secondary | ICD-10-CM | POA: Diagnosis not present

## 2021-02-25 DIAGNOSIS — F411 Generalized anxiety disorder: Secondary | ICD-10-CM

## 2021-02-25 DIAGNOSIS — G4710 Hypersomnia, unspecified: Secondary | ICD-10-CM

## 2021-02-25 DIAGNOSIS — G4733 Obstructive sleep apnea (adult) (pediatric): Secondary | ICD-10-CM | POA: Diagnosis not present

## 2021-02-25 MED ORDER — DULOXETINE HCL 30 MG PO CPEP: ORAL_CAPSULE | ORAL | 0 refills | Status: DC

## 2021-02-25 NOTE — Progress Notes (Signed)
Jermaine Waters [id number] 1946/05/07 75 y.o.  Virtual Visit via Telephone Note  I connected with pt by telephone and verified that I am speaking with the correct person using two identifiers.   I discussed the limitations, risks, security and privacy concerns of performing an evaluation and management service by telephone and the availability of in person appointments. I also discussed with the patient that there may be a patient responsible charge related to this service. The patient expressed understanding and agreed to proceed.  I discussed the assessment and treatment plan with the patient. The patient was provided an opportunity to ask questions and all were answered. The patient agreed with the plan and demonstrated an understanding of the instructions.   The patient was advised to call back or seek an in-person evaluation if the symptoms worsen or if the condition fails to improve as anticipated.  I provided 30 minutes of non-face-to-face time during this encounter. The call started at 3:00 and ended at 330. The patient was located at home and the provider was located office.    Subjective:   Patient ID:  Jermaine Waters is a 75 y.o. (DOB 15-May-1946) male.  Chief Complaint:  Chief Complaint  Patient presents with  . Follow-up  . Depression, major, recurrent, in complete remission ([place])    Depression        Associated symptoms include fatigue.  Jermaine Waters presents to the office today for follow-up of mood concerns and hypersomnolence.    Patient was seen August 08, 2019.  The assessment was depression.  The following was done: PleasE STOP  bupropion 75 mg. Start bupropion XL 150 mg tablets 1 each morning for 2 weeks. If no side effects such as jitteriness, then increase to 2 tablets each morning which equals 300 mg daily. He continued Ritalin 10 QID.  Patient was seen October 10, 2019.  He had a partial response with Wellbutrin XL 300 mg daily.  He was tolerating it  and therefore the dosage was increased to 450 mg a day. He continued Ritalin 10 mg 4 times daily No Covid problems.   Didn't notice much difference with increase Wellbutrin.  Wife thinks he's a little more lethargic.  In some ways a little happier but really struggling with wife.  Pending temporary separation for a few months to try to provide some healing.  Hopefully will help both of them.   Wife concerned about Ritalin Rx for alertness DT OSA and central apnea that even affects him while awake.  PT meds could fall asleep driving.  Ritalin helps and CPAP helps.  Helps alerness and no insomnia.  Wonders about SE of it. A little more optimistic and less tense.  Therapy helpng also. No SE.  Not jitttery nor edgy.  Wife critical that he's less responsive, but maybe I'm less on edge. Energy is fine.  Interests hard to answer bc not a lot of outside activities left DT Covid.  Couple of small groups meeting with Zoom.  Most of free time before was singing but can't do it now. Things changed and wants meds and therapy.  PCP started Wellbutrin a couple of weeks ago. All my life self-sabotage with jobs and has to restart.  Fear of failure.  Some worsening health issues dropping productivity.  Wife frustrated with him and constantly advising and critical.  Marital stress and unhappiness worse bc together all the time. Plan because of residual depression consideration was given to augment with Abilify.  He was in the process  of moving and he felt that perhaps after the move he might have a different view and he wanted to defer.  So no meds were changed.  Appointment Apr 07, 2020, the following was noted: He did get moved.  Still having conflict with his wife which is a chronic stress. Pt reports that mood is Anxious, Depressed and Dysphoric and describes depression and anxiety as mild or mild to moderate but overall improved since last visit.. Anxiety symptoms include: Excessive Worry, avoidant of stress or  pressure or commitments.. Pt reports no sleep issues and 7 hours but prefers 8 hours. Pt reports that appetite is good. Pt reports that energy is poor and anhedonia, poor motivation and withdrawn from usual activities. Concentration is down slightly. Suicidal thoughts:  denied by patient. Plan:  No med changes  05/21/2020 appointment the following is noted: Urgent appt at his request. One concern is more anxiety and procrastinates.Jermaine Waters if he's working on the wrong problem.  Jermaine Waters of everything.  ? Fear of failure.  Easily stressed by small demands.  Stress averse and not operating at full capacity. No history of anxiety meds.   Counseling. Ritalin 10 QID for hypersomnolence but questions ADD bc is distractible.  Sleep apnea for 20 years on CPAP.  Chronically sleepy. Plan: no med changes:  08/26/2020 appointment with the following noted: Increased Wellbutrin to 450 mg has worked fine. Feeling great.  Only negative is sleepy if sits down to read. Sleepy all his life and long term CPAP user.  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 No trouble with [place] since July 20 and no anxiety while she's gone.   Plan: Doing well.  No med changes  12/31/2020 appointment with following noted: Wife joined per patient request Overall OK but somewhat lifeless and disconnected and wonders if med related or not and if changes can be made. Otherwise not anxious.  Not joyful.   Wife Jermaine Waters has different perspective in some areas.  She thinks he's too inactive.  She says since retirement he lacks drive to do much.  Rare exercise. Plan: Reduce the Lexapro to 5 mg nightly to see if it is having a dulling effect   02/25/2021 appointment with the following noted: No difference noted with less Lexapro.  Overall sort of cloudy and in my own world.  Wife CO his memory issues.  Depressed, frustrated, overwhelmed and probably worse after the reduction in meds. Caring for wife with TKR.  Stressful relationship  very. Does go out 3-4 nights per week with various groups and some church activity.   Past Psychiatric Medication Trials:  History Jermaine Waters.  On and off antidepressants for 20 years  Doesn't remember any being helpful.  lexapro 10  Remote Wellbutrin 450.  Doesn't remember the names of prior meds but remembers sexual SE with one. History of Nuvigil for hypersomnolence but he does not recall the response but apparently not good enough so Ritalin was RX in it's place  Review of Systems:  Review of Systems  Constitutional: Positive for fatigue.  Eyes: Positive for visual disturbance.  Cardiovascular: Negative for chest pain and palpitations.  Gastrointestinal: Negative for diarrhea.  Musculoskeletal: Positive for arthralgias.  Neurological: Negative for tremors and weakness.  Psychiatric/Behavioral: Positive for depression.    Medications: I have reviewed the patient's current medications.  Current Outpatient Medications  Medication Sig Dispense Refill  . buPROPion (WELLBUTRIN XL) 150 MG 24 hr tablet TAKE 3 TABLETS(450 MG) BY MOUTH DAILY 270 tablet 1  .  ciclopirox (PENLAC) 8 % solution Apply topically at bedtime. Apply over nail and surrounding skin. Apply daily over previous coat. After seven (7) days, may remove with alcohol and continue cycle. 6.6 mL 2  . CONTOUR NEXT TEST test strip     . Cyanocobalamin (VITAMIN B 12) 500 MCG TABS Take 2,000 Units by mouth.    . doxycycline (VIBRAMYCIN) 100 MG capsule Take 100 mg by mouth daily.    Marland Kitchen escitalopram (LEXAPRO) 5 MG tablet Take 1 tablet (5 mg total) by mouth daily. 90 tablet 0  . methylphenidate (RITALIN) 10 MG tablet Take 1 tablet (10 mg total) by mouth 4 (four) times daily. 120 tablet 0  . NOVOLOG 100 UNIT/ML injection U IN INSULIN PUMP .APPROXIMATELY 80 UNITS A DAY  6  . pravastatin (PRAVACHOL) 20 MG tablet TK 1 T PO QD    . PROLENSA 0.07 % SOLN INT 1 GTT IN OS HS  6  . ramipril (ALTACE) 5 MG capsule Take 5 mg by mouth 2 (two)  times daily.     Marland Kitchen SHINGRIX injection   0  . sildenafil (REVATIO) 20 MG tablet Take 20 mg by mouth as needed (take 2-5 tablets as needed for erections).    Marland Kitchen amLODipine (NORVASC) 2.5 MG tablet TK 1 T PO QD (Patient not taking: Reported on 02/25/2021)  12  . amoxicillin (AMOXIL) 500 MG capsule Take by mouth. (Patient not taking: Reported on 02/25/2021)    . B-12 TR 2000 MCG TBCR Take by mouth. (Patient not taking: Reported on 02/25/2021)    . magnesium 30 MG tablet Take 30 mg by mouth 2 (two) times daily. (Patient not taking: Reported on 02/25/2021)     No current facility-administered medications for this visit.    Medication Side Effects: None  Allergies: No Known Allergies  Past Medical History:  Diagnosis Date  . Anxiety   . Arthritis   . Cancer ([place])    skin CA removed  . Depression   . Diabetes mellitus   . Diabetes mellitus without complication (HCC)    Type 1  . Elevated PSA 11/2014   PCP ordered for patient to take Cipro for 21 days  . Erectile dysfunction   . Hypercholesteremia   . Skin cancer of face   . Sleep apnea    wears CPAP nightly    Family History  Problem Relation Age of Onset  . Cancer Father        Esophageal/gastric cancer  . Cancer Mother        Metastatic cancer  . Rheum arthritis Mother   . Jermaine cancer Neg Hx   . Pancreatic cancer Neg Hx   . Stomach cancer Neg Hx     Social History   Socioeconomic History  . Marital status: Married    Spouse name: Not on file  . Number of children: 2  . Years of education: Not on file  . Highest education level: Not on file  Occupational History  . Occupation: Retired    Fish farm manager: [employer]  Tobacco Use  . Smoking status: Former Smoker    Packs/day: 2.00    Years: 15.00    Pack years: 30.00    Types: Cigarettes    Quit date: 1981    Years since quitting: 41.2  . Smokeless tobacco: Never Used  Substance and Sexual Activity  . Alcohol use: No    Comment: 1986  . Drug use: No  . Sexual  activity: Not on file  Other Topics Concern  . Not on file  Social History Narrative    Merged History Encounter        Social Determinants of Health   Financial Resource Strain: Not on file  Food Insecurity: Not on file  Transportation Needs: Not on file  Physical Activity: Not on file  Stress: Not on file  Social Connections: Not on file  Intimate Partner Violence: Not on file    Past Medical History, Surgical history, Social history, and Family history were reviewed and updated as appropriate.   Please see review of systems for further details on the patient's review from today.   Objective:   Physical Exam:  There were no vitals taken for this visit.  Physical Exam Neurological:     Mental Status: He is alert and oriented to person, place, and time.     Cranial Nerves: No dysarthria.  Psychiatric:        Attention and Perception: Attention and perception normal.        Mood and Affect: Mood is depressed. Mood is not anxious.        Speech: Speech normal.        Behavior: Behavior is cooperative.        Thought Content: Thought content normal. Thought content is not paranoid or delusional. Thought content does not include homicidal or suicidal ideation. Thought content does not include homicidal or suicidal plan.        Cognition and Memory: Cognition and memory normal.        Judgment: Judgment normal.     Comments: Insight intact      Lab Review:     Component Value Date/Time   NA 139 12/02/2014 0842   K 4.3 12/02/2014 0842   CL 106 12/02/2014 0842   CO2 25 12/02/2014 0842   GLUCOSE 223 (H) 12/02/2014 0842   BUN 24 (H) 12/02/2014 0842   CREATININE 0.87 12/02/2014 0842   CALCIUM 8.8 12/02/2014 0842   GFRNONAA 88 (L) 12/02/2014 0842   GFRAA >90 12/02/2014 0842       Component Value Date/Time   WBC 4.9 12/02/2014 0843   RBC 4.90 12/02/2014 0843   HGB 14.8 12/02/2014 0843   HCT 44.1 12/02/2014 0843   PLT 226 12/02/2014 0843   MCV 90.0 12/02/2014 0843    MCH 30.2 12/02/2014 0843   MCHC 33.6 12/02/2014 0843   RDW 13.1 12/02/2014 0843    No results found for: POCLITH, LITHIUM   No results found for: PHENYTOIN, PHENOBARB, VALPROATE, CBMZ   .res Assessment: Plan:    Faizaan was seen today for follow-up and depression, major, recurrent, in complete remission (hcc).  Diagnoses and all orders for this visit:  Major depressive disorder, recurrent episode, moderate (HCC)  Generalized anxiety disorder  Sleep apnea with hypersomnolence  Early onset dysthymia  Marital conflict  Obstructive sleep apnea     Greater than 50% of 30 min My Chart time with patient was spent on counseling and coordination of care. We discussed When first seen he denied significant depressive symptoms although his wife thought he was depressed.    He realizes he also needs therapy for this tendency to self sabotage and also for marital conflict that has resulted.  Was much less depressed and stressed when wife was away and now is more depressed and stressed again.  No better with less Lexapro and probably worse.   Continue activity in retirement.  Disc examples like classes, [facility].  Continue Wellbutrin XL  450 bc earlier had improvement in sadness and outlook and energy.  Tolerating it.   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 Still enjoys that.   Options switch duloxetine or Trintellix.  Will try duloxetine bc generic and wean off Lexapro. DC Lexapro and start duloxetine 30 mg for a week then 60 mg daily.  Talk to PCP about his low BP possibly making him sluggish and less reactive.  After Visit Summary for patient specific instructions  FU 2 months  Lynder Parents, MD, DFAPA   Future Appointments  Date Time Provider [place]   03/05/2021  8:30 AM Debbora Presto, NP [facility] None  03/19/2021  7:30 AM Hayden Pedro, MD [facility] None  10/08/2021  8:15 AM McDonald, Stephan Minister, DPM [facility]    No orders of the defined types were placed in this encounter.   -------------------------------

## 2021-03-05 ENCOUNTER — Ambulatory Visit: Payer: Self-pay | Admitting: Family Medicine

## 2021-03-10 DIAGNOSIS — F3289 Other specified depressive episodes: Secondary | ICD-10-CM | POA: Diagnosis not present

## 2021-03-10 DIAGNOSIS — E785 Hyperlipidemia, unspecified: Secondary | ICD-10-CM | POA: Diagnosis not present

## 2021-03-10 DIAGNOSIS — Z794 Long term (current) use of insulin: Secondary | ICD-10-CM | POA: Diagnosis not present

## 2021-03-10 DIAGNOSIS — G4733 Obstructive sleep apnea (adult) (pediatric): Secondary | ICD-10-CM | POA: Diagnosis not present

## 2021-03-10 DIAGNOSIS — I10 Essential (primary) hypertension: Secondary | ICD-10-CM | POA: Diagnosis not present

## 2021-03-10 DIAGNOSIS — E1040 Type 1 diabetes mellitus with diabetic neuropathy, unspecified: Secondary | ICD-10-CM | POA: Diagnosis not present

## 2021-03-13 ENCOUNTER — Other Ambulatory Visit: Payer: Self-pay | Admitting: Psychiatry

## 2021-03-19 ENCOUNTER — Other Ambulatory Visit: Payer: Self-pay

## 2021-03-19 ENCOUNTER — Encounter (INDEPENDENT_AMBULATORY_CARE_PROVIDER_SITE_OTHER): Payer: Medicare Other | Admitting: Ophthalmology

## 2021-03-19 DIAGNOSIS — H34812 Central retinal vein occlusion, left eye, with macular edema: Secondary | ICD-10-CM

## 2021-03-19 DIAGNOSIS — E113591 Type 2 diabetes mellitus with proliferative diabetic retinopathy without macular edema, right eye: Secondary | ICD-10-CM

## 2021-03-19 DIAGNOSIS — E113512 Type 2 diabetes mellitus with proliferative diabetic retinopathy with macular edema, left eye: Secondary | ICD-10-CM | POA: Diagnosis not present

## 2021-03-19 DIAGNOSIS — H43813 Vitreous degeneration, bilateral: Secondary | ICD-10-CM | POA: Diagnosis not present

## 2021-03-30 ENCOUNTER — Telehealth: Payer: Self-pay | Admitting: Psychiatry

## 2021-03-30 NOTE — Telephone Encounter (Signed)
Please review

## 2021-03-30 NOTE — Telephone Encounter (Signed)
Jermaine Waters called today to see if CC could offer a recommendation for Neuro psychological testing to be done. Pls call [phone]

## 2021-04-06 ENCOUNTER — Telehealth: Payer: Self-pay | Admitting: Psychiatry

## 2021-04-06 ENCOUNTER — Other Ambulatory Visit: Payer: Self-pay

## 2021-04-06 MED ORDER — DULOXETINE HCL 60 MG PO CPEP: ORAL_CAPSULE | ORAL | 1 refills | Status: DC

## 2021-04-06 NOTE — Telephone Encounter (Signed)
Please let me know the quantity I should send

## 2021-04-06 NOTE — Telephone Encounter (Signed)
Actually change his strength to 60 mg 1 capsule daily instead of 2 of the 30 mg. Send in #30 1 refill.

## 2021-04-06 NOTE — Telephone Encounter (Signed)
Rx sent 

## 2021-04-06 NOTE — Telephone Encounter (Signed)
Correct.  Thanks. 

## 2021-04-06 NOTE — Telephone Encounter (Signed)
Sudie Bailey called to request that a corrected prescription for duloxetine with corrected directions of 2 capsules per day.  The one sent has it as 1 capsule for 1 week, then 2 caps/day.  He is past that and is taking 2/day.  Send to [facility] in [place], [place]

## 2021-04-07 ENCOUNTER — Other Ambulatory Visit: Payer: Self-pay | Admitting: Psychiatry

## 2021-04-07 DIAGNOSIS — F3341 Major depressive disorder, recurrent, in partial remission: Secondary | ICD-10-CM

## 2021-04-07 DIAGNOSIS — F3342 Major depressive disorder, recurrent, in full remission: Secondary | ICD-10-CM

## 2021-04-15 NOTE — Telephone Encounter (Signed)
Please see previous messages

## 2021-04-15 NOTE — Telephone Encounter (Signed)
Addendum to previous note of 03/30/21....Marland KitchenMarland KitchenJonpaul just called to check on the status of the referral he had asked about on his last visit when he saw Dr. Clovis Pu to go for Neuro psychological testing. On the 03/30/21 note it shows forwarded to Dr. Clovis Pu to review. His phone number is [phone].

## 2021-04-16 NOTE — Telephone Encounter (Signed)
I'll try to write this tomorrow

## 2021-04-23 ENCOUNTER — Other Ambulatory Visit: Payer: Self-pay | Admitting: Psychiatry

## 2021-04-25 DIAGNOSIS — Z20822 Contact with and (suspected) exposure to covid-19: Secondary | ICD-10-CM | POA: Diagnosis not present

## 2021-05-05 DIAGNOSIS — Z794 Long term (current) use of insulin: Secondary | ICD-10-CM | POA: Diagnosis not present

## 2021-05-05 DIAGNOSIS — R222 Localized swelling, mass and lump, trunk: Secondary | ICD-10-CM | POA: Diagnosis not present

## 2021-05-12 DIAGNOSIS — Z794 Long term (current) use of insulin: Secondary | ICD-10-CM | POA: Diagnosis not present

## 2021-05-12 DIAGNOSIS — E785 Hyperlipidemia, unspecified: Secondary | ICD-10-CM | POA: Diagnosis not present

## 2021-05-12 DIAGNOSIS — I10 Essential (primary) hypertension: Secondary | ICD-10-CM | POA: Diagnosis not present

## 2021-05-12 DIAGNOSIS — Z4681 Encounter for fitting and adjustment of insulin pump: Secondary | ICD-10-CM | POA: Diagnosis not present

## 2021-05-12 DIAGNOSIS — E10319 Type 1 diabetes mellitus with unspecified diabetic retinopathy without macular edema: Secondary | ICD-10-CM | POA: Diagnosis not present

## 2021-05-14 ENCOUNTER — Encounter (INDEPENDENT_AMBULATORY_CARE_PROVIDER_SITE_OTHER): Payer: Medicare Other | Admitting: Ophthalmology

## 2021-05-14 ENCOUNTER — Other Ambulatory Visit: Payer: Self-pay

## 2021-05-14 DIAGNOSIS — E113512 Type 2 diabetes mellitus with proliferative diabetic retinopathy with macular edema, left eye: Secondary | ICD-10-CM | POA: Diagnosis not present

## 2021-05-14 DIAGNOSIS — H35033 Hypertensive retinopathy, bilateral: Secondary | ICD-10-CM

## 2021-05-14 DIAGNOSIS — I10 Essential (primary) hypertension: Secondary | ICD-10-CM | POA: Diagnosis not present

## 2021-05-14 DIAGNOSIS — E113591 Type 2 diabetes mellitus with proliferative diabetic retinopathy without macular edema, right eye: Secondary | ICD-10-CM | POA: Diagnosis not present

## 2021-05-14 DIAGNOSIS — H34812 Central retinal vein occlusion, left eye, with macular edema: Secondary | ICD-10-CM

## 2021-05-14 DIAGNOSIS — H43813 Vitreous degeneration, bilateral: Secondary | ICD-10-CM

## 2021-05-19 DIAGNOSIS — D692 Other nonthrombocytopenic purpura: Secondary | ICD-10-CM | POA: Diagnosis not present

## 2021-05-19 DIAGNOSIS — Z85828 Personal history of other malignant neoplasm of skin: Secondary | ICD-10-CM | POA: Diagnosis not present

## 2021-05-19 DIAGNOSIS — L578 Other skin changes due to chronic exposure to nonionizing radiation: Secondary | ICD-10-CM | POA: Diagnosis not present

## 2021-05-19 DIAGNOSIS — L821 Other seborrheic keratosis: Secondary | ICD-10-CM | POA: Diagnosis not present

## 2021-05-19 DIAGNOSIS — L812 Freckles: Secondary | ICD-10-CM | POA: Diagnosis not present

## 2021-05-19 DIAGNOSIS — D225 Melanocytic nevi of trunk: Secondary | ICD-10-CM | POA: Diagnosis not present

## 2021-05-23 ENCOUNTER — Other Ambulatory Visit: Payer: Self-pay | Admitting: Psychiatry

## 2021-05-28 DIAGNOSIS — E785 Hyperlipidemia, unspecified: Secondary | ICD-10-CM | POA: Diagnosis not present

## 2021-05-28 DIAGNOSIS — E10319 Type 1 diabetes mellitus with unspecified diabetic retinopathy without macular edema: Secondary | ICD-10-CM | POA: Diagnosis not present

## 2021-06-01 ENCOUNTER — Encounter: Payer: Self-pay | Admitting: Psychiatry

## 2021-06-01 ENCOUNTER — Telehealth (INDEPENDENT_AMBULATORY_CARE_PROVIDER_SITE_OTHER): Payer: Medicare Other | Admitting: Psychiatry

## 2021-06-01 DIAGNOSIS — G4710 Hypersomnia, unspecified: Secondary | ICD-10-CM | POA: Diagnosis not present

## 2021-06-01 DIAGNOSIS — G4730 Sleep apnea, unspecified: Secondary | ICD-10-CM | POA: Diagnosis not present

## 2021-06-01 DIAGNOSIS — F411 Generalized anxiety disorder: Secondary | ICD-10-CM | POA: Diagnosis not present

## 2021-06-01 DIAGNOSIS — F331 Major depressive disorder, recurrent, moderate: Secondary | ICD-10-CM | POA: Diagnosis not present

## 2021-06-01 DIAGNOSIS — F341 Dysthymic disorder: Secondary | ICD-10-CM | POA: Diagnosis not present

## 2021-06-01 DIAGNOSIS — Z630 Problems in relationship with spouse or partner: Secondary | ICD-10-CM

## 2021-06-01 MED ORDER — DULOXETINE HCL 20 MG PO CPEP: ORAL_CAPSULE | ORAL | 0 refills | Status: DC

## 2021-06-01 NOTE — Progress Notes (Signed)
Jermaine Waters [id number] Oct 23, 1946 75 y.o.  Virtual Visit via Telephone Note  I connected with pt by telephone and verified that I am speaking with the correct person using two identifiers.   I discussed the limitations, risks, security and privacy concerns of performing an evaluation and management service by telephone and the availability of in person appointments. I also discussed with the patient that there may be a patient responsible charge related to this service. The patient expressed understanding and agreed to proceed.  I discussed the assessment and treatment plan with the patient. The patient was provided an opportunity to ask questions and all were answered. The patient agreed with the plan and demonstrated an understanding of the instructions.   The patient was advised to call back or seek an in-person evaluation if the symptoms worsen or if the condition fails to improve as anticipated.  I provided 30 minutes of non-face-to-face time during this encounter.  The patient was located at home and the provider was located office.  Session started at 130 and ended at 2 PM    Subjective:   Patient ID:  Jermaine Waters is a 75 y.o. (DOB [birth year]) male.  Chief Complaint:  Chief Complaint  Patient presents with   Depression    Depression        Associated symptoms include decreased concentration and fatigue. Jawad Wiacek presents to the office today for follow-up of mood concerns and hypersomnolence.    Patient was seen August 08, 2019.  The assessment was depression.  The following was done: PleasE STOP  bupropion 75 mg. Start bupropion XL 150 mg tablets 1 each morning for 2 weeks. If no side effects such as jitteriness, then increase to 2 tablets each morning which equals 300 mg daily. He continued Ritalin 10 QID.  Patient was seen October 10, 2019.  He had a partial response with Wellbutrin XL 300 mg daily.  He was tolerating it and therefore the dosage was  increased to 450 mg a day. He continued Ritalin 10 mg 4 times daily No Covid problems.   Didn't notice much difference with increase Wellbutrin.  Wife thinks he's a little more lethargic.  In some ways a little happier but really struggling with wife.  Pending temporary separation for a few months to try to provide some healing.  Hopefully will help both of them.   Wife concerned about Ritalin Rx for alertness DT OSA and central apnea that even affects him while awake.  PT meds could fall asleep driving.  Ritalin helps and CPAP helps.  Helps alerness and no insomnia.  Wonders about SE of it. A little more optimistic and less tense.  Therapy helpng also. No SE.  Not jitttery nor edgy.  Wife critical that he's less responsive, but maybe I'm less on edge. Energy is fine.  Interests hard to answer bc not a lot of outside activities left DT Covid.  Couple of small groups meeting with Zoom.  Most of free time before was singing but can't do it now. Things changed and wants meds and therapy.  PCP started Wellbutrin a couple of weeks ago. All my life self-sabotage with jobs and has to restart.  Fear of failure.  Some worsening health issues dropping productivity.  Wife frustrated with him and constantly advising and critical.  Marital stress and unhappiness worse bc together all the time. Plan because of residual depression consideration was given to augment with Abilify.  He was in the process of moving and he felt  that perhaps after the move he might have a different view and he wanted to defer.  So no meds were changed.  Appointment Apr 07, 2020, the following was noted: He did get moved.  Still having conflict with his wife which is a chronic stress. Pt reports that mood is Anxious, Depressed and Dysphoric and describes depression and anxiety as mild or mild to moderate but overall improved since last visit.. Anxiety symptoms include: Excessive Worry, avoidant of stress or pressure or commitments. . Pt  reports no sleep issues and 7 hours but prefers 8 hours. Pt reports that appetite is good. Pt reports that energy is poor and anhedonia, poor motivation and withdrawn from usual activities. Concentration is down slightly. Suicidal thoughts:  denied by patient. Plan:  No med changes  05/21/2020 appointment the following is noted: Urgent appt at his request. One concern is more anxiety and procrastinates.[place] if he's working on the wrong problem.  [name] of everything.  ? Fear of failure.  Easily stressed by small demands.  Stress averse and not operating at full capacity. No history of anxiety meds.   Counseling. Ritalin 10 QID for hypersomnolence but questions ADD bc is distractible.  Sleep apnea for 20 years on CPAP.  Chronically sleepy. Plan: no med changes:  08/26/2020 appointment with the following noted: Increased Wellbutrin to 450 mg has worked fine. Feeling great.  Only negative is sleepy if sits down to read. Sleepy all his life and long term CPAP user.  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 No trouble with AK since July 20 and no anxiety while she's gone.   Plan: Doing well.  No med changes  12/31/2020 appointment with following noted: Wife joined per patient request Overall OK but somewhat lifeless and disconnected and wonders if med related or not and if changes can be made. Otherwise not anxious.  Not joyful.   Wife [name] has different perspective in some areas.  She thinks he's too inactive.  She says since retirement he lacks drive to do much.  Rare exercise. Plan: Reduce the Lexapro to 5 mg nightly to see if it is having a dulling effect   02/25/2021 appointment with the following noted: No difference noted with less Lexapro.  Overall sort of cloudy and in my own world.  Wife CO his memory issues.  Depressed, frustrated, overwhelmed and probably worse after the reduction in meds. Caring for wife with TKR.  Stressful relationship very. Does go out 3-4 nights  per week with various groups and some church activity.  Plan: DC Lexapro and start duloxetine 30 mg for a week then 60 mg daily. Continue Wellbutrin XL 450 mg daily Continue Ritalin 10 mg 4 times daily  06/01/2021 appointment with the following noted: A little more irritable.  Esp with his wife.  Wife complains about him.  He's afraid of saying something that upsets her. Chronic sleepiness is worse he thinks.  Thinks it's med related. Depression is unchanged.    Past Psychiatric Medication Trials:  History [name].  On and off antidepressants for 20 years  Doesn't remember any being helpful.  lexapro 10  Remote Wellbutrin 450.  Doesn't remember the names of prior meds but remembers sexual SE with one. History of Nuvigil for hypersomnolence but he does not recall the response but apparently not good enough so Ritalin was RX in it's place  Review of Systems:  Review of Systems  Constitutional:  Positive for fatigue.  Eyes:  Positive for visual  disturbance.  Cardiovascular:  Negative for chest pain and palpitations.  Gastrointestinal:  Negative for diarrhea.  Musculoskeletal:  Positive for arthralgias.  Neurological:  Negative for tremors and weakness.  Psychiatric/Behavioral:  Positive for decreased concentration.    Medications: I have reviewed the patient's current medications.  Current Outpatient Medications  Medication Sig Dispense Refill   B-12 TR 2000 MCG TBCR Take by mouth.     ciclopirox (PENLAC) 8 % solution Apply topically at bedtime. Apply over nail and surrounding skin. Apply daily over previous coat. After seven (7) days, may remove with alcohol and continue cycle. 6.6 mL 2   CONTOUR NEXT TEST test strip      Cyanocobalamin (VITAMIN B 12) 500 MCG TABS Take 2,000 Units by mouth.     magnesium 30 MG tablet Take 30 mg by mouth 2 (two) times daily.     methylphenidate (RITALIN) 10 MG tablet Take 1 tablet (10 mg total) by mouth 4 (four) times daily. 120 tablet 0    NOVOLOG 100 UNIT/ML injection U IN INSULIN PUMP .APPROXIMATELY 80 UNITS A DAY  6   pravastatin (PRAVACHOL) 20 MG tablet TK 1 T PO QD     PROLENSA 0.07 % SOLN INT 1 GTT IN OS HS  6   SHINGRIX injection   0   sildenafil (REVATIO) 20 MG tablet Take 20 mg by mouth as needed (take 2-5 tablets as needed for erections).     amLODipine (NORVASC) 2.5 MG tablet TK 1 T PO QD (Patient not taking: Reported on 02/25/2021)  12   buPROPion (WELLBUTRIN XL) 150 MG 24 hr tablet TAKE 3 TABLETS(450 MG) BY MOUTH DAILY (Patient not taking: Reported on 06/01/2021) 270 tablet 0   doxycycline (VIBRAMYCIN) 100 MG capsule Take 100 mg by mouth daily. (Patient not taking: Reported on 06/01/2021)     DULoxetine (CYMBALTA) 20 MG capsule 2 daily for 2 weeks, then 1 daily for 2 weeks, then stop it. 21 capsule 0   ramipril (ALTACE) 5 MG capsule Take 5 mg by mouth 2 (two) times daily.  (Patient not taking: Reported on 06/01/2021)     No current facility-administered medications for this visit.    Medication Side Effects: None  Allergies: No Known Allergies  Past Medical History:  Diagnosis Date   Anxiety    Arthritis    Cancer ([place])    skin CA removed   Depression    Diabetes mellitus    Diabetes mellitus without complication (HCC)    Type 1   Elevated PSA 11/2014   PCP ordered for patient to take Cipro for 21 days   Erectile dysfunction    Hypercholesteremia    Skin cancer of face    Sleep apnea    wears CPAP nightly    Family History  Problem Relation Age of Onset   Cancer Father        Esophageal/gastric cancer   Cancer Mother        Metastatic cancer   Rheum arthritis Mother    Colon cancer Neg Hx    Pancreatic cancer Neg Hx    Stomach cancer Neg Hx     Social History   Socioeconomic History   Marital status: Married    Spouse name: Not on file   Number of children: 2   Years of education: Not on file   Highest education level: Not on file  Occupational History   Occupation: Retired     Fish farm manager: [employer]  Tobacco Use  Smoking status: Former    Packs/day: 2.00    Years: 15.00    Pack years: 30.00    Types: Cigarettes    Quit date: 1981    Years since quitting: 41.5   Smokeless tobacco: Never  Substance and Sexual Activity   Alcohol use: No    Comment: 1986   Drug use: No   Sexual activity: Not on file  Other Topics Concern   Not on file  Social History Narrative    Merged History Encounter        Social Determinants of Health   Financial Resource Strain: Not on file  Food Insecurity: Not on file  Transportation Needs: Not on file  Physical Activity: Not on file  Stress: Not on file  Social Connections: Not on file  Intimate Partner Violence: Not on file    Past Medical History, Surgical history, Social history, and Family history were reviewed and updated as appropriate.   Please see review of systems for further details on the patient's review from today.   Objective:   Physical Exam:  There were no vitals taken for this visit.  Physical Exam Neurological:     Mental Status: He is alert and oriented to person, place, and time.     Cranial Nerves: No dysarthria.  Psychiatric:        Attention and Perception: Attention and perception normal.        Mood and Affect: Mood is depressed. Mood is not anxious.        Speech: Speech normal.        Behavior: Behavior is cooperative.        Thought Content: Thought content normal. Thought content is not paranoid or delusional. Thought content does not include homicidal or suicidal ideation. Thought content does not include homicidal or suicidal plan.        Cognition and Memory: Cognition and memory normal.        Judgment: Judgment normal.     Comments: Insight intact     Lab Review:     Component Value Date/Time   NA 139 12/02/2014 0842   K 4.3 12/02/2014 0842   CL 106 12/02/2014 0842   CO2 25 12/02/2014 0842   GLUCOSE 223 (H) 12/02/2014 0842   BUN 24 (H) 12/02/2014 0842   CREATININE  0.87 12/02/2014 0842   CALCIUM 8.8 12/02/2014 0842   GFRNONAA 88 (L) 12/02/2014 0842   GFRAA >90 12/02/2014 0842       Component Value Date/Time   WBC 4.9 12/02/2014 0843   RBC 4.90 12/02/2014 0843   HGB 14.8 12/02/2014 0843   HCT 44.1 12/02/2014 0843   PLT 226 12/02/2014 0843   MCV 90.0 12/02/2014 0843   MCH 30.2 12/02/2014 0843   MCHC 33.6 12/02/2014 0843   RDW 13.1 12/02/2014 0843    No results found for: POCLITH, LITHIUM   No results found for: PHENYTOIN, PHENOBARB, VALPROATE, CBMZ   .res Assessment: Plan:    Marquan was seen today for depression.  Diagnoses and all orders for this visit:  Major depressive disorder, recurrent episode, moderate (HCC) -     DULoxetine (CYMBALTA) 20 MG capsule; 2 daily for 2 weeks, then 1 daily for 2 weeks, then stop it.  Generalized anxiety disorder -     DULoxetine (CYMBALTA) 20 MG capsule; 2 daily for 2 weeks, then 1 daily for 2 weeks, then stop it.  Early onset dysthymia  Sleep apnea with hypersomnolence  Marital conflict  Greater than 50% of 30 min My Chart time with patient was spent on counseling and coordination of care. We discussed When first seen he denied significant depressive symptoms although his wife thought he was depressed.    He realizes he also needs therapy for this tendency to self sabotage and also for marital conflict that has resulted.  Ongoing conflict with his wife. Was much less depressed and stressed when wife was away and now is more depressed and stressed again.  No better duloxetine in terms of mood and complains of increased sleepiness.  Continue activity in retirement.  Disc examples like classes, [facility].  Continue Wellbutrin XL 450 bc earlier had improvement in sadness and outlook and energy.  Tolerating it.   Hypersomnolence predates antidepressants and failed to respond to armodafinil but is improved though not resolved with Ritalin 10 mg 4 times daily  Sober for years but  decided to restart AA and finish what he's started and he thinks it's fantastic. Still enjoys that.   Options switch duloxetine or Trintellix.   Wean duloxetine using 20 mg capsules to 40 mg daily for 2 weeks then 20 mg daily for 2 weeks then stop it in order to prevent withdrawal symptoms.  He will pay attention to see if irritability and alertness are improved.  Talk to PCP about his low BP possibly making him sluggish and less reactive.  After Visit Summary for patient specific instructions  FU 2 months  Lynder Parents, MD, DFAPA   Future Appointments  Date Time Provider [place]  06/26/2021  8:00 AM Hayden Pedro, MD [facility] None  10/08/2021  8:15 AM McDonald, Stephan Minister, DPM [facility]    No orders of the defined types were placed in this encounter.   -------------------------------

## 2021-06-02 ENCOUNTER — Telehealth: Payer: Self-pay | Admitting: Psychiatry

## 2021-06-02 NOTE — Telephone Encounter (Signed)
Blank message

## 2021-06-05 ENCOUNTER — Telehealth: Payer: Self-pay | Admitting: Psychiatry

## 2021-06-05 DIAGNOSIS — F411 Generalized anxiety disorder: Secondary | ICD-10-CM

## 2021-06-05 DIAGNOSIS — F331 Major depressive disorder, recurrent, moderate: Secondary | ICD-10-CM

## 2021-06-09 DIAGNOSIS — Z20822 Contact with and (suspected) exposure to covid-19: Secondary | ICD-10-CM | POA: Diagnosis not present

## 2021-06-15 ENCOUNTER — Other Ambulatory Visit: Payer: Self-pay

## 2021-06-15 NOTE — Telephone Encounter (Signed)
Please review Jermaine Waters's message and let me know what to  send

## 2021-06-15 NOTE — Telephone Encounter (Signed)
Pt called and said that the scriopt for the duloxetine didn't have the right quantity, so he is now out and was not able to do two weeks of the original script. Please send in the reamaing pills that he needs to the [facility] to help him wean off med

## 2021-06-16 ENCOUNTER — Other Ambulatory Visit: Payer: Self-pay | Admitting: Psychiatry

## 2021-06-16 DIAGNOSIS — F331 Major depressive disorder, recurrent, moderate: Secondary | ICD-10-CM

## 2021-06-16 DIAGNOSIS — F411 Generalized anxiety disorder: Secondary | ICD-10-CM

## 2021-06-16 MED ORDER — DULOXETINE HCL 20 MG PO CPEP: ORAL_CAPSULE | ORAL | 0 refills | Status: DC

## 2021-06-16 NOTE — Telephone Encounter (Signed)
Corrected prescription sent with corrected quantity

## 2021-06-19 ENCOUNTER — Other Ambulatory Visit: Payer: Self-pay | Admitting: Psychiatry

## 2021-06-19 DIAGNOSIS — F411 Generalized anxiety disorder: Secondary | ICD-10-CM

## 2021-06-19 DIAGNOSIS — F331 Major depressive disorder, recurrent, moderate: Secondary | ICD-10-CM

## 2021-06-22 ENCOUNTER — Ambulatory Visit (INDEPENDENT_AMBULATORY_CARE_PROVIDER_SITE_OTHER): Payer: Medicare Other | Admitting: Podiatry

## 2021-06-22 ENCOUNTER — Other Ambulatory Visit: Payer: Self-pay

## 2021-06-22 DIAGNOSIS — L84 Corns and callosities: Secondary | ICD-10-CM | POA: Diagnosis not present

## 2021-06-22 DIAGNOSIS — E119 Type 2 diabetes mellitus without complications: Secondary | ICD-10-CM | POA: Diagnosis not present

## 2021-06-22 DIAGNOSIS — S90212A Contusion of left great toe with damage to nail, initial encounter: Secondary | ICD-10-CM

## 2021-06-22 NOTE — Progress Notes (Signed)
  Subjective:  Patient ID: Jermaine Waters, male    DOB: [birth year],  MRN: [id number]  Chief Complaint  Patient presents with   Nail Problem     great toe on left foot has a blood blister pt is diabetic    75 y.o. male presents with the above complaint. History confirmed with patient.  Does not recall a specific injury but noticed blood under the left hallux nail.  The callus on the right foot is also painful again  Objective:  Physical Exam: warm, good capillary refill, no trophic changes or ulcerative lesions, normal DP and PT pulses and normal sensory exam.  Left hallux has a subungual hematoma with no loosening of nail.  Mild tenderness on palpation.  The right foot submet 5 has a painful callus. Assessment:   1. Subungual hematoma of great toe of left foot, initial encounter   2. Callus of foot   3. Well controlled type 2 diabetes mellitus ([place])      Plan:  Patient was evaluated and treated and all questions answered.  Patient educated on diabetes. Discussed proper diabetic foot care and discussed risks and complications of disease. Educated patient in depth on reasons to return to the office immediately should he/she discover anything concerning or new on the feet. All questions answered. Discussed proper shoes as well.   Nail is well adhered and should continue to grow does not appear to be require avulsion currently  All symptomatic hyperkeratoses were safely debrided with a sterile #15 blade to patient's level of comfort without incident. We discussed preventative and palliative care of these lesions including supportive and accommodative shoegear, padding, prefabricated and custom molded accommodative orthoses, use of a pumice stone and lotions/creams daily.   No follow-ups on file.

## 2021-06-26 ENCOUNTER — Other Ambulatory Visit: Payer: Self-pay

## 2021-06-26 ENCOUNTER — Encounter (INDEPENDENT_AMBULATORY_CARE_PROVIDER_SITE_OTHER): Payer: Medicare Other | Admitting: Ophthalmology

## 2021-06-26 DIAGNOSIS — H34812 Central retinal vein occlusion, left eye, with macular edema: Secondary | ICD-10-CM

## 2021-06-26 DIAGNOSIS — E113512 Type 2 diabetes mellitus with proliferative diabetic retinopathy with macular edema, left eye: Secondary | ICD-10-CM

## 2021-06-26 DIAGNOSIS — E113591 Type 2 diabetes mellitus with proliferative diabetic retinopathy without macular edema, right eye: Secondary | ICD-10-CM

## 2021-06-26 DIAGNOSIS — H43813 Vitreous degeneration, bilateral: Secondary | ICD-10-CM | POA: Diagnosis not present

## 2021-07-09 DIAGNOSIS — E1040 Type 1 diabetes mellitus with diabetic neuropathy, unspecified: Secondary | ICD-10-CM | POA: Diagnosis not present

## 2021-07-09 DIAGNOSIS — Z125 Encounter for screening for malignant neoplasm of prostate: Secondary | ICD-10-CM | POA: Diagnosis not present

## 2021-07-09 DIAGNOSIS — E785 Hyperlipidemia, unspecified: Secondary | ICD-10-CM | POA: Diagnosis not present

## 2021-07-09 DIAGNOSIS — I10 Essential (primary) hypertension: Secondary | ICD-10-CM | POA: Diagnosis not present

## 2021-07-10 ENCOUNTER — Encounter (INDEPENDENT_AMBULATORY_CARE_PROVIDER_SITE_OTHER): Payer: Self-pay

## 2021-07-13 DIAGNOSIS — R413 Other amnesia: Secondary | ICD-10-CM | POA: Diagnosis not present

## 2021-07-13 DIAGNOSIS — R82998 Other abnormal findings in urine: Secondary | ICD-10-CM | POA: Diagnosis not present

## 2021-07-21 DIAGNOSIS — Z0000 Encounter for general adult medical examination without abnormal findings: Secondary | ICD-10-CM | POA: Diagnosis not present

## 2021-07-21 DIAGNOSIS — F900 Attention-deficit hyperactivity disorder, predominantly inattentive type: Secondary | ICD-10-CM | POA: Diagnosis not present

## 2021-07-21 DIAGNOSIS — Z794 Long term (current) use of insulin: Secondary | ICD-10-CM | POA: Diagnosis not present

## 2021-07-21 DIAGNOSIS — I10 Essential (primary) hypertension: Secondary | ICD-10-CM | POA: Diagnosis not present

## 2021-07-21 DIAGNOSIS — E785 Hyperlipidemia, unspecified: Secondary | ICD-10-CM | POA: Diagnosis not present

## 2021-07-21 DIAGNOSIS — E10319 Type 1 diabetes mellitus with unspecified diabetic retinopathy without macular edema: Secondary | ICD-10-CM | POA: Diagnosis not present

## 2021-07-21 DIAGNOSIS — G4733 Obstructive sleep apnea (adult) (pediatric): Secondary | ICD-10-CM | POA: Diagnosis not present

## 2021-07-21 DIAGNOSIS — R413 Other amnesia: Secondary | ICD-10-CM | POA: Diagnosis not present

## 2021-07-21 DIAGNOSIS — M546 Pain in thoracic spine: Secondary | ICD-10-CM | POA: Diagnosis not present

## 2021-07-21 DIAGNOSIS — F329 Major depressive disorder, single episode, unspecified: Secondary | ICD-10-CM | POA: Diagnosis not present

## 2021-07-21 DIAGNOSIS — R972 Elevated prostate specific antigen [PSA]: Secondary | ICD-10-CM | POA: Diagnosis not present

## 2021-08-04 DIAGNOSIS — F332 Major depressive disorder, recurrent severe without psychotic features: Secondary | ICD-10-CM | POA: Diagnosis not present

## 2021-08-04 DIAGNOSIS — Z630 Problems in relationship with spouse or partner: Secondary | ICD-10-CM | POA: Diagnosis not present

## 2021-08-04 DIAGNOSIS — F411 Generalized anxiety disorder: Secondary | ICD-10-CM | POA: Diagnosis not present

## 2021-08-04 DIAGNOSIS — R413 Other amnesia: Secondary | ICD-10-CM | POA: Diagnosis not present

## 2021-08-08 ENCOUNTER — Other Ambulatory Visit: Payer: Self-pay | Admitting: Psychiatry

## 2021-08-08 DIAGNOSIS — F3341 Major depressive disorder, recurrent, in partial remission: Secondary | ICD-10-CM

## 2021-08-08 DIAGNOSIS — F3342 Major depressive disorder, recurrent, in full remission: Secondary | ICD-10-CM

## 2021-08-11 DIAGNOSIS — M546 Pain in thoracic spine: Secondary | ICD-10-CM | POA: Diagnosis not present

## 2021-08-11 DIAGNOSIS — M545 Low back pain, unspecified: Secondary | ICD-10-CM | POA: Diagnosis not present

## 2021-08-12 DIAGNOSIS — E10319 Type 1 diabetes mellitus with unspecified diabetic retinopathy without macular edema: Secondary | ICD-10-CM | POA: Diagnosis not present

## 2021-08-12 DIAGNOSIS — R972 Elevated prostate specific antigen [PSA]: Secondary | ICD-10-CM | POA: Diagnosis not present

## 2021-08-12 DIAGNOSIS — E785 Hyperlipidemia, unspecified: Secondary | ICD-10-CM | POA: Diagnosis not present

## 2021-08-12 DIAGNOSIS — I10 Essential (primary) hypertension: Secondary | ICD-10-CM | POA: Diagnosis not present

## 2021-08-12 DIAGNOSIS — Z4681 Encounter for fitting and adjustment of insulin pump: Secondary | ICD-10-CM | POA: Diagnosis not present

## 2021-08-12 DIAGNOSIS — Z794 Long term (current) use of insulin: Secondary | ICD-10-CM | POA: Diagnosis not present

## 2021-08-12 DIAGNOSIS — Z23 Encounter for immunization: Secondary | ICD-10-CM | POA: Diagnosis not present

## 2021-08-13 NOTE — Telephone Encounter (Signed)
Please schedule appt

## 2021-08-14 ENCOUNTER — Other Ambulatory Visit: Payer: Self-pay

## 2021-08-20 ENCOUNTER — Ambulatory Visit (INDEPENDENT_AMBULATORY_CARE_PROVIDER_SITE_OTHER): Payer: Medicare Other | Admitting: Psychiatry

## 2021-08-20 ENCOUNTER — Other Ambulatory Visit: Payer: Self-pay

## 2021-08-20 ENCOUNTER — Encounter: Payer: Self-pay | Admitting: Psychiatry

## 2021-08-20 DIAGNOSIS — F411 Generalized anxiety disorder: Secondary | ICD-10-CM

## 2021-08-20 DIAGNOSIS — G4710 Hypersomnia, unspecified: Secondary | ICD-10-CM | POA: Diagnosis not present

## 2021-08-20 DIAGNOSIS — F341 Dysthymic disorder: Secondary | ICD-10-CM | POA: Diagnosis not present

## 2021-08-20 DIAGNOSIS — M546 Pain in thoracic spine: Secondary | ICD-10-CM | POA: Diagnosis not present

## 2021-08-20 DIAGNOSIS — F331 Major depressive disorder, recurrent, moderate: Secondary | ICD-10-CM

## 2021-08-20 DIAGNOSIS — G4733 Obstructive sleep apnea (adult) (pediatric): Secondary | ICD-10-CM | POA: Diagnosis not present

## 2021-08-20 DIAGNOSIS — Z630 Problems in relationship with spouse or partner: Secondary | ICD-10-CM

## 2021-08-20 DIAGNOSIS — G4730 Sleep apnea, unspecified: Secondary | ICD-10-CM | POA: Diagnosis not present

## 2021-08-20 DIAGNOSIS — M545 Low back pain, unspecified: Secondary | ICD-10-CM | POA: Diagnosis not present

## 2021-08-20 NOTE — Progress Notes (Signed)
Martino Bajor [id number] May 12, 1946 75 y.o.   Subjective:   Patient ID:  Jermaine Waters is a 75 y.o. (DOB 30-Jun-1946) male.  Chief Complaint:  Chief Complaint  Patient presents with   Follow-up   Depression   ADHD    Depression        Associated symptoms include decreased concentration and fatigue. Danile Gronemeyer presents to the office today for follow-up of mood concerns and hypersomnolence.    Patient was seen August 08, 2019.  The assessment was depression.  The following was done: STOP  bupropion 75 mg. Start bupropion XL 150 mg tablets 1 each morning for 2 weeks. If no side effects such as jitteriness, then increase to 2 tablets each morning which equals 300 mg daily. He continued Ritalin 10 QID.  Patient was seen October 10, 2019.  He had a partial response with Wellbutrin XL 300 mg daily.  He was tolerating it and therefore the dosage was increased to 450 mg a day. He continued Ritalin 10 mg 4 times daily No Covid problems.   Didn't notice much difference with increase Wellbutrin.  Wife thinks he's a little more lethargic.  In some ways a little happier but really struggling with wife.  Pending temporary separation for a few months to try to provide some healing.  Hopefully will help both of them.   Wife concerned about Ritalin Rx for alertness DT OSA and central apnea that even affects him while awake.  PT meds could fall asleep driving.  Ritalin helps and CPAP helps.  Helps alerness and no insomnia.  Wonders about SE of it. A little more optimistic and less tense.  Therapy helpng also. No SE.  Not jitttery nor edgy.  Wife critical that he's less responsive, but maybe I'm less on edge. Energy is fine.  Interests hard to answer bc not a lot of outside activities left DT Covid.  Couple of small groups meeting with Zoom.  Most of free time before was singing but can't do it now. Things changed and wants meds and therapy.  PCP started Wellbutrin a couple of weeks ago. All my  life self-sabotage with jobs and has to restart.  Fear of failure.  Some worsening health issues dropping productivity.  Wife frustrated with him and constantly advising and critical.  Marital stress and unhappiness worse bc together all the time. Plan because of residual depression consideration was given to augment with Abilify.  He was in the process of moving and he felt that perhaps after the move he might have a different view and he wanted to defer.  So no meds were changed.  Appointment Apr 07, 2020, the following was noted: He did get moved.  Still having conflict with his wife which is a chronic stress. Pt reports that mood is Anxious, Depressed and Dysphoric and describes depression and anxiety as mild or mild to moderate but overall improved since last visit.. Anxiety symptoms include: Excessive Worry, avoidant of stress or pressure or commitments. . Pt reports no sleep issues and 7 hours but prefers 8 hours. Pt reports that appetite is good. Pt reports that energy is poor and anhedonia, poor motivation and withdrawn from usual activities. Concentration is down slightly. Suicidal thoughts:  denied by patient. Plan:  No med changes  05/21/2020 appointment the following is noted: Urgent appt at his request. One concern is more anxiety and procrastinates.[place] if he's working on the wrong problem.  [name] of everything.  ? Fear of failure.  Easily  stressed by small demands.  Stress averse and not operating at full capacity. No history of anxiety meds.   Counseling. Ritalin 10 QID for hypersomnolence but questions ADD bc is distractible.  Sleep apnea for 20 years on CPAP.  Chronically sleepy. Plan: no med changes:  08/26/2020 appointment with the following noted: Increased Wellbutrin to 450 mg has worked fine. Feeling great.  Only negative is sleepy if sits down to read. Sleepy all his life and long term CPAP user.  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 No trouble with [place]  since July 20 and no anxiety while she's gone.   Plan: Doing well.  No med changes  12/31/2020 appointment with following noted: Wife joined per patient request Overall OK but somewhat lifeless and disconnected and wonders if med related or not and if changes can be made. Otherwise not anxious.  Not joyful.   Wife [name] has different perspective in some areas.  She thinks he's too inactive.  She says since retirement he lacks drive to do much.  Rare exercise. Plan: Reduce the Lexapro to 5 mg nightly to see if it is having a dulling effect   02/25/2021 appointment with the following noted: No difference noted with less Lexapro.  Overall sort of cloudy and in my own world.  Wife CO his memory issues.  Depressed, frustrated, overwhelmed and probably worse after the reduction in meds. Caring for wife with TKR.  Stressful relationship very. Does go out 3-4 nights per week with various groups and some church activity.  Plan: DC Lexapro and start duloxetine 30 mg for a week then 60 mg daily. Continue Wellbutrin XL 450 mg daily Continue Ritalin 10 mg 4 times daily  06/01/2021 appointment with the following noted: A little more irritable.  Esp with his wife.  Wife complains about him.  He's afraid of saying something that upsets her. Chronic sleepiness is worse he thinks.  Thinks it's med related. Depression is unchanged.   Plan: Wean duloxetine using 20 mg capsules to 40 mg daily for 2 weeks then 20 mg daily for 2 weeks then stop it in order to prevent withdrawal symptoms.  He will pay attention to see if irritability and alertness are improved. Continue Wellbutrin XL 450 mg daily Continue Ritalin 10 mg tablets 4 times daily  08/20/2021 appointment with the following noted: At times doesn't think any meds are doing anything and doesn't remember meds helping him.  Wonders if he can get off the Wellbutrin Neuropsych testing is not suggesting dementia. Still chronic marital problems no better and  hostile relationship.  Talking about separation.  Working to sell the house. Not highly motivated.  Knows he'd feel better separated from wife.  Can find things he enjoys at home.  Thinks he's underachieved in life.  Occ feels hopeless.  Lists of things he needs to do with constant sense of procrastination on things necessary to do.   Past Psychiatric Medication Trials:  History [name].  On and off antidepressants for 20 years  Doesn't remember any being helpful.  lexapro 10  Remote Wellbutrin 450.  Doesn't remember the names of prior meds but remembers sexual SE with one. History of Nuvigil for hypersomnolence but he does not recall the response but apparently not good enough so Ritalin was RX in it's place  Review of Systems:  Review of Systems  Constitutional:  Positive for fatigue.  Eyes:  Positive for visual disturbance.  Cardiovascular:  Negative for chest pain and palpitations.  Gastrointestinal:  Negative for diarrhea and nausea.  Musculoskeletal:  Positive for arthralgias.  Neurological:  Negative for tremors and weakness.  Psychiatric/Behavioral:  Positive for decreased concentration.    Medications: I have reviewed the patient's current medications.  Current Outpatient Medications  Medication Sig Dispense Refill   B-12 TR 2000 MCG TBCR Take by mouth.     buPROPion (WELLBUTRIN XL) 150 MG 24 hr tablet TAKE 3 TABLETS(450 MG) BY MOUTH DAILY 270 tablet 0   ciclopirox (PENLAC) 8 % solution Apply topically at bedtime. Apply over nail and surrounding skin. Apply daily over previous coat. After seven (7) days, may remove with alcohol and continue cycle. 6.6 mL 2   CONTOUR NEXT TEST test strip      Cyanocobalamin (VITAMIN B 12) 500 MCG TABS Take 2,000 Units by mouth.     doxycycline (VIBRAMYCIN) 100 MG capsule Take 100 mg by mouth daily.     magnesium 30 MG tablet Take 30 mg by mouth 2 (two) times daily.     methylphenidate (RITALIN) 10 MG tablet Take 1 tablet (10 mg total)  by mouth 4 (four) times daily. 120 tablet 0   NOVOLOG 100 UNIT/ML injection U IN INSULIN PUMP .APPROXIMATELY 80 UNITS A DAY  6   pravastatin (PRAVACHOL) 20 MG tablet TK 1 T PO QD     PROLENSA 0.07 % SOLN INT 1 GTT IN OS HS  6   ramipril (ALTACE) 5 MG capsule Take 5 mg by mouth 2 (two) times daily.     SHINGRIX injection   0   sildenafil (REVATIO) 20 MG tablet Take 20 mg by mouth as needed (take 2-5 tablets as needed for erections).     amLODipine (NORVASC) 2.5 MG tablet TK 1 T PO QD (Patient not taking: Reported on 02/25/2021)  12   No current facility-administered medications for this visit.    Medication Side Effects: None  Allergies: No Known Allergies  Past Medical History:  Diagnosis Date   Anxiety    Arthritis    Cancer ([place])    skin CA removed   Depression    Diabetes mellitus    Diabetes mellitus without complication (HCC)    Type 1   Elevated PSA 11/2014   PCP ordered for patient to take Cipro for 21 days   Erectile dysfunction    Hypercholesteremia    Skin cancer of face    Sleep apnea    wears CPAP nightly    Family History  Problem Relation Age of Onset   Cancer Father        Esophageal/gastric cancer   Cancer Mother        Metastatic cancer   Rheum arthritis Mother    Colon cancer Neg Hx    Pancreatic cancer Neg Hx    Stomach cancer Neg Hx     Social History   Socioeconomic History   Marital status: Married    Spouse name: Not on file   Number of children: 2   Years of education: Not on file   Highest education level: Not on file  Occupational History   Occupation: Retired    Fish farm manager: [employer]  Tobacco Use   Smoking status: Former    Packs/day: 2.00    Years: 15.00    Pack years: 30.00    Types: Cigarettes    Quit date: 1981    Years since quitting: 41.7   Smokeless tobacco: Never  Substance and Sexual Activity   Alcohol use: No  Comment: 1986   Drug use: No   Sexual activity: Not on file  Other Topics Concern   Not on file   Social History Narrative    Merged History Encounter        Social Determinants of Health   Financial Resource Strain: Not on file  Food Insecurity: Not on file  Transportation Needs: Not on file  Physical Activity: Not on file  Stress: Not on file  Social Connections: Not on file  Intimate Partner Violence: Not on file    Past Medical History, Surgical history, Social history, and Family history were reviewed and updated as appropriate.   Please see review of systems for further details on the patient's review from today.   Objective:   Physical Exam:  There were no vitals taken for this visit.  Physical Exam Constitutional:      General: He is not in acute distress. Musculoskeletal:        General: No deformity.  Neurological:     Mental Status: He is alert and oriented to person, place, and time.     Cranial Nerves: No dysarthria.     Coordination: Coordination normal.  Psychiatric:        Attention and Perception: Attention and perception normal. He does not perceive auditory or visual hallucinations.        Mood and Affect: Mood is depressed. Mood is not anxious. Affect is not labile, blunt, angry or inappropriate.        Speech: Speech normal.        Behavior: Behavior normal. Behavior is not slowed. Behavior is cooperative.        Thought Content: Thought content normal. Thought content is not paranoid or delusional. Thought content does not include homicidal or suicidal ideation. Thought content does not include homicidal or suicidal plan.        Cognition and Memory: Cognition and memory normal.        Judgment: Judgment normal.     Comments: Insight intact     Lab Review:     Component Value Date/Time   NA 139 12/02/2014 0842   K 4.3 12/02/2014 0842   CL 106 12/02/2014 0842   CO2 25 12/02/2014 0842   GLUCOSE 223 (H) 12/02/2014 0842   BUN 24 (H) 12/02/2014 0842   CREATININE 0.87 12/02/2014 0842   CALCIUM 8.8 12/02/2014 0842   GFRNONAA 88 (L)  12/02/2014 0842   GFRAA >90 12/02/2014 0842       Component Value Date/Time   WBC 4.9 12/02/2014 0843   RBC 4.90 12/02/2014 0843   HGB 14.8 12/02/2014 0843   HCT 44.1 12/02/2014 0843   PLT 226 12/02/2014 0843   MCV 90.0 12/02/2014 0843   MCH 30.2 12/02/2014 0843   MCHC 33.6 12/02/2014 0843   RDW 13.1 12/02/2014 0843    No results found for: POCLITH, LITHIUM   No results found for: PHENYTOIN, PHENOBARB, VALPROATE, CBMZ   .res Assessment: Plan:    Hamid was seen today for follow-up, depression and adhd.  Diagnoses and all orders for this visit:  Major depressive disorder, recurrent episode, moderate (HCC)  Generalized anxiety disorder  Early onset dysthymia  Sleep apnea with hypersomnolence  Marital conflict  Obstructive sleep apnea    Greater than 50% of 30 min  with patient was spent on counseling and coordination of care. We discussed When first seen he denied significant depressive symptoms although his wife thought he was depressed.    He realizes he also  needs therapy for this tendency to self sabotage and also for marital conflict that has resulted.  Ongoing conflict with his wife. Was much less depressed and stressed when wife was away and now is more depressed and stressed again.  Continue activity in retirement.  Disc examples like classes, [facility].  Disc poss separation to diminish hostility in the relationship, which in turn might help his depression bc she is highly critical of him.  Disc concerns about wife who is a pt.  She's moving to fast and angry.    No worse off duloxetine.  Option wean Wellbutrin bc doesn't think it's helping even though earlier had improvement in sadness and outlook and energy.  Discussed the alternative of Trintellix because it is a unique antidepressant that has shown to have cognitive benefits. Disc options at length. Wean Wellbutrin and start Trintellix 5 mg daily for 4 to 7 days and if there is no nausea then  increase to 10 mg daily.  Hypersomnolence predates antidepressants and failed to respond to armodafinil but is improved though not resolved with Ritalin 10 mg 4 times daily  Sober for years but decided to restart AA and finish what he's started and he thinks it's fantastic. Still enjoys that.   Talk to PCP about his low BP possibly making him sluggish and less reactive.  His wife had accused him of having dementia however he had neuropsych testing in [place] which ruled out dementia.  After Visit Summary for patient specific instructions  FU 2 months  Lynder Parents, MD, DFAPA   Future Appointments  Date Time Provider [place]  08/21/2021  7:45 AM Hayden Pedro, MD [facility] None  10/08/2021  8:15 AM Criselda Peaches, DPM [facility]  10/20/2021 10:30 AM Cottle, Billey Co., MD [facility] None    No orders of the defined types were placed in this encounter.   -------------------------------

## 2021-08-20 NOTE — Patient Instructions (Signed)
Reduce wellbutrin to 2 tablets in AM for 1 week, then reduce Wellbutrin to 1 tablet daily for 1 week, then stop  Start Trintellix 5 mg in AM for 4-7 days, then increase to 10 mg each AM

## 2021-08-21 ENCOUNTER — Encounter (INDEPENDENT_AMBULATORY_CARE_PROVIDER_SITE_OTHER): Payer: Medicare Other | Admitting: Ophthalmology

## 2021-08-21 DIAGNOSIS — H43813 Vitreous degeneration, bilateral: Secondary | ICD-10-CM | POA: Diagnosis not present

## 2021-08-21 DIAGNOSIS — H34812 Central retinal vein occlusion, left eye, with macular edema: Secondary | ICD-10-CM | POA: Diagnosis not present

## 2021-08-21 DIAGNOSIS — E113512 Type 2 diabetes mellitus with proliferative diabetic retinopathy with macular edema, left eye: Secondary | ICD-10-CM

## 2021-08-21 DIAGNOSIS — E113591 Type 2 diabetes mellitus with proliferative diabetic retinopathy without macular edema, right eye: Secondary | ICD-10-CM | POA: Diagnosis not present

## 2021-08-27 DIAGNOSIS — M546 Pain in thoracic spine: Secondary | ICD-10-CM | POA: Diagnosis not present

## 2021-08-27 DIAGNOSIS — M545 Low back pain, unspecified: Secondary | ICD-10-CM | POA: Diagnosis not present

## 2021-09-08 DIAGNOSIS — M545 Low back pain, unspecified: Secondary | ICD-10-CM | POA: Diagnosis not present

## 2021-09-08 DIAGNOSIS — M546 Pain in thoracic spine: Secondary | ICD-10-CM | POA: Diagnosis not present

## 2021-09-16 ENCOUNTER — Ambulatory Visit (INDEPENDENT_AMBULATORY_CARE_PROVIDER_SITE_OTHER): Payer: Medicare Other | Admitting: Psychiatry

## 2021-09-16 ENCOUNTER — Encounter: Payer: Self-pay | Admitting: Psychiatry

## 2021-09-16 ENCOUNTER — Other Ambulatory Visit: Payer: Self-pay

## 2021-09-16 VITALS — BP 141/77 | HR 77

## 2021-09-16 DIAGNOSIS — Z630 Problems in relationship with spouse or partner: Secondary | ICD-10-CM | POA: Diagnosis not present

## 2021-09-16 DIAGNOSIS — G4710 Hypersomnia, unspecified: Secondary | ICD-10-CM

## 2021-09-16 DIAGNOSIS — F411 Generalized anxiety disorder: Secondary | ICD-10-CM | POA: Diagnosis not present

## 2021-09-16 DIAGNOSIS — F331 Major depressive disorder, recurrent, moderate: Secondary | ICD-10-CM | POA: Diagnosis not present

## 2021-09-16 DIAGNOSIS — G4730 Sleep apnea, unspecified: Secondary | ICD-10-CM | POA: Diagnosis not present

## 2021-09-16 MED ORDER — VORTIOXETINE HBR 10 MG PO TABS: 10.0000 mg | ORAL_TABLET | Freq: Every day | ORAL | 1 refills | Status: DC

## 2021-09-16 NOTE — Progress Notes (Signed)
Jermaine Waters [id number] 1945-12-27 75 y.o.   Subjective:   Patient ID:  Jermaine Waters is a 76 y.o. (DOB 04/30/1946) male.  Chief Complaint:  Chief Complaint  Patient presents with   Follow-up   Depression   Stress    Depression        Associated symptoms include decreased concentration and fatigue. Jermaine Waters presents to the office today for follow-up of mood concerns and hypersomnolence.    Patient was seen August 08, 2019.  The assessment was depression.  The following was done: STOP  bupropion 75 mg. Start bupropion XL 150 mg tablets 1 each morning for 2 weeks. If no side effects such as jitteriness, then increase to 2 tablets each morning which equals 300 mg daily. He continued Ritalin 10 QID.  Patient was seen October 10, 2019.  He had a partial response with Wellbutrin XL 300 mg daily.  He was tolerating it and therefore the dosage was increased to 450 mg a day. He continued Ritalin 10 mg 4 times daily No Covid problems.   Didn't notice much difference with increase Wellbutrin.  Wife thinks he's a little more lethargic.  In some ways a little happier but really struggling with wife.  Pending temporary separation for a few months to try to provide some healing.  Hopefully will help both of them.   Wife concerned about Ritalin Rx for alertness DT OSA and central apnea that even affects him while awake.  PT meds could fall asleep driving.  Ritalin helps and CPAP helps.  Helps alerness and no insomnia.  Wonders about SE of it. A little more optimistic and less tense.  Therapy helpng also. No SE.  Not jitttery nor edgy.  Wife critical that he's less responsive, but maybe I'm less on edge. Energy is fine.  Interests hard to answer bc not a lot of outside activities left DT Covid.  Couple of small groups meeting with Zoom.  Most of free time before was singing but can't do it now. Things changed and wants meds and therapy.  PCP started Wellbutrin a couple of weeks ago. All my  life self-sabotage with jobs and has to restart.  Fear of failure.  Some worsening health issues dropping productivity.  Wife frustrated with him and constantly advising and critical.  Marital stress and unhappiness worse bc together all the time. Plan because of residual depression consideration was given to augment with Abilify.  He was in the process of moving and he felt that perhaps after the move he might have a different view and he wanted to defer.  So no meds were changed.  Appointment Apr 07, 2020, the following was noted: He did get moved.  Still having conflict with his wife which is a chronic stress. Pt reports that mood is Anxious, Depressed and Dysphoric and describes depression and anxiety as mild or mild to moderate but overall improved since last visit.. Anxiety symptoms include: Excessive Worry, avoidant of stress or pressure or commitments. . Pt reports no sleep issues and 7 hours but prefers 8 hours. Pt reports that appetite is good. Pt reports that energy is poor and anhedonia, poor motivation and withdrawn from usual activities. Concentration is down slightly. Suicidal thoughts:  denied by patient. Plan:  No med changes  05/21/2020 appointment the following is noted: Urgent appt at his request. One concern is more anxiety and procrastinates.Jermaine Waters if he's working on the wrong problem.  Jermaine Waters of everything.  ? Fear of failure.  Easily  stressed by small demands.  Stress averse and not operating at full capacity. No history of anxiety meds.   Counseling. Ritalin 10 QID for hypersomnolence but questions ADD bc is distractible.  Sleep apnea for 20 years on CPAP.  Chronically sleepy. Plan: no med changes:  08/26/2020 appointment with the following noted: Increased Wellbutrin to 450 mg has worked fine. Feeling great.  Only negative is sleepy if sits down to read. Sleepy all his life and long term CPAP user.  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 No trouble with AK  since July 20 and no anxiety while she's gone.   Plan: Doing well.  No med changes  12/31/2020 appointment with following noted: Wife joined per patient request Overall OK but somewhat lifeless and disconnected and wonders if med related or not and if changes can be made. Otherwise not anxious.  Not joyful.   Wife Jermaine Waters has different perspective in some areas.  She thinks he's too inactive.  She says since retirement he lacks drive to do much.  Rare exercise. Plan: Reduce the Lexapro to 5 mg nightly to see if it is having a dulling effect   02/25/2021 appointment with the following noted: No difference noted with less Lexapro.  Overall sort of cloudy and in my own world.  Wife CO his memory issues.  Depressed, frustrated, overwhelmed and probably worse after the reduction in meds. Caring for wife with TKR.  Stressful relationship very. Does go out 3-4 nights per week with various groups and some church activity.  Plan: DC Lexapro and start duloxetine 30 mg for a week then 60 mg daily. Continue Wellbutrin XL 450 mg daily Continue Ritalin 10 mg 4 times daily  06/01/2021 appointment with the following noted: A little more irritable.  Esp with his wife.  Wife complains about him.  He's afraid of saying something that upsets her. Chronic sleepiness is worse he thinks.  Thinks it's med related. Depression is unchanged.   Plan: Wean duloxetine using 20 mg capsules to 40 mg daily for 2 weeks then 20 mg daily for 2 weeks then stop it in order to prevent withdrawal symptoms.  He will pay attention to see if irritability and alertness are improved. Continue Wellbutrin XL 450 mg daily Continue Ritalin 10 mg tablets 4 times daily  08/20/2021 appointment with the following noted: At times doesn't think any meds are doing anything and doesn't remember meds helping him.  Wonders if he can get off the Wellbutrin Neuropsych testing is not suggesting dementia. Still chronic marital problems no better and  hostile relationship.  Talking about separation.  Working to sell the house. Not highly motivated.  Knows he'd feel better separated from wife.  Can find things he enjoys at home.  Thinks he's underachieved in life.  Occ feels hopeless.  Lists of things he needs to do with constant sense of procrastination on things necessary to do. Plan: Wean Wellbutrin and start Trintellix 5 mg daily for 4 to 7 days and if there is no nausea then increase to 10 mg daily.  09/16/21 appt noted: Just increased Trintellix to 20 mg daily 2 days ago.  No SE noted. Off Wellbutrin. Signed contract to sell house by end of month and wife gone off deep-end and not functioning and is at home in bed.  She's having more emotional outbursts and "definitely hyped".  He thinks she's hyper and talking constantly and more aggressive, slamming doors etc than usual. Overall he feels stronger and happier with  change in meds.  He'd like to contrinue it.    Past Psychiatric Medication Trials:  History [name].  On and off antidepressants for 20 years  Doesn't remember any being helpful.  lexapro 10  Remote Wellbutrin 450.  Doesn't remember the names of prior meds but remembers sexual SE with one. History of Nuvigil for hypersomnolence but he does not recall the response but apparently not good enough so Ritalin was RX in it's place  Review of Systems:  Review of Systems  Constitutional:  Positive for fatigue.  Eyes:  Positive for visual disturbance.  Cardiovascular:  Negative for chest pain and palpitations.  Gastrointestinal:  Negative for nausea.  Musculoskeletal:  Positive for arthralgias.  Neurological:  Negative for tremors and weakness.  Psychiatric/Behavioral:  Positive for decreased concentration.    Medications: I have reviewed the patient's current medications.  Current Outpatient Medications  Medication Sig Dispense Refill   B-12 TR 2000 MCG TBCR Take by mouth.     ciclopirox (PENLAC) 8 % solution Apply  topically at bedtime. Apply over nail and surrounding skin. Apply daily over previous coat. After seven (7) days, may remove with alcohol and continue cycle. 6.6 mL 2   CONTOUR NEXT TEST test strip      Cyanocobalamin (VITAMIN B 12) 500 MCG TABS Take 2,000 Units by mouth.     methylphenidate (RITALIN) 10 MG tablet Take 1 tablet (10 mg total) by mouth 4 (four) times daily. 120 tablet 0   NOVOLOG 100 UNIT/ML injection U IN INSULIN PUMP .APPROXIMATELY 80 UNITS A DAY  6   pravastatin (PRAVACHOL) 20 MG tablet TK 1 T PO QD     PROLENSA 0.07 % SOLN INT 1 GTT IN OS HS  6   ramipril (ALTACE) 5 MG capsule Take 5 mg by mouth 2 (two) times daily.     sildenafil (REVATIO) 20 MG tablet Take 20 mg by mouth as needed (take 2-5 tablets as needed for erections).     amLODipine (NORVASC) 2.5 MG tablet TK 1 T PO QD (Patient not taking: Reported on 02/25/2021)  12   magnesium 30 MG tablet Take 30 mg by mouth 2 (two) times daily. (Patient not taking: Reported on 09/16/2021)     vortioxetine HBr (TRINTELLIX) 10 MG TABS tablet Take 1 tablet (10 mg total) by mouth daily. 30 tablet 1   No current facility-administered medications for this visit.    Medication Side Effects: None  Allergies: No Known Allergies  Past Medical History:  Diagnosis Date   Anxiety    Arthritis    Cancer ([place])    skin CA removed   Depression    Diabetes mellitus    Diabetes mellitus without complication ([place])    Type 1   Elevated PSA 11/2014   PCP ordered for patient to take Cipro for 21 days   Erectile dysfunction    Hypercholesteremia    Skin cancer of face    Sleep apnea    wears CPAP nightly    Family History  Problem Relation Age of Onset   Cancer Father        Esophageal/gastric cancer   Cancer Mother        Metastatic cancer   Rheum arthritis Mother    Jermaine cancer Neg Hx    Pancreatic cancer Neg Hx    Stomach cancer Neg Hx     Social History   Socioeconomic History   Marital status: Married    Spouse name:  Not on file  Number of children: 2   Years of education: Not on file   Highest education level: Not on file  Occupational History   Occupation: Retired    Fish farm manager: [employer]  Tobacco Use   Smoking status: Former    Packs/day: 2.00    Years: 15.00    Pack years: 30.00    Types: Cigarettes    Quit date: 1981    Years since quitting: 41.8   Smokeless tobacco: Never  Substance and Sexual Activity   Alcohol use: No    Comment: 1986   Drug use: No   Sexual activity: Not on file  Other Topics Concern   Not on file  Social History Narrative    Merged History Encounter        Social Determinants of Health   Financial Resource Strain: Not on file  Food Insecurity: Not on file  Transportation Needs: Not on file  Physical Activity: Not on file  Stress: Not on file  Social Connections: Not on file  Intimate Partner Violence: Not on file    Past Medical History, Surgical history, Social history, and Family history were reviewed and updated as appropriate.   Please see review of systems for further details on the patient's review from today.   Objective:   Physical Exam:  BP (!) 141/77   Pulse 77   Physical Exam Constitutional:      General: He is not in acute distress. Musculoskeletal:        General: No deformity.  Neurological:     Mental Status: He is alert and oriented to person, place, and time.     Cranial Nerves: No dysarthria.     Coordination: Coordination normal.  Psychiatric:        Attention and Perception: Attention and perception normal. He does not perceive auditory or visual hallucinations.        Mood and Affect: Mood is depressed. Affect is not labile, blunt, angry or inappropriate.        Speech: Speech normal.        Behavior: Behavior normal. Behavior is not slowed. Behavior is cooperative.        Thought Content: Thought content normal. Thought content is not paranoid or delusional. Thought content does not include homicidal or suicidal  ideation. Thought content does not include homicidal or suicidal plan.        Cognition and Memory: Cognition and memory normal.        Judgment: Judgment normal.     Comments: Insight intact     Lab Review:     Component Value Date/Time   NA 139 12/02/2014 0842   K 4.3 12/02/2014 0842   CL 106 12/02/2014 0842   CO2 25 12/02/2014 0842   GLUCOSE 223 (H) 12/02/2014 0842   BUN 24 (H) 12/02/2014 0842   CREATININE 0.87 12/02/2014 0842   CALCIUM 8.8 12/02/2014 0842   GFRNONAA 88 (L) 12/02/2014 0842   GFRAA >90 12/02/2014 0842       Component Value Date/Time   WBC 4.9 12/02/2014 0843   RBC 4.90 12/02/2014 0843   HGB 14.8 12/02/2014 0843   HCT 44.1 12/02/2014 0843   PLT 226 12/02/2014 0843   MCV 90.0 12/02/2014 0843   MCH 30.2 12/02/2014 0843   MCHC 33.6 12/02/2014 0843   RDW 13.1 12/02/2014 0843    No results found for: POCLITH, LITHIUM   No results found for: PHENYTOIN, PHENOBARB, VALPROATE, CBMZ   .res Assessment: Plan:  Jermaine Waters was seen today for follow-up, depression and stress.  Diagnoses and all orders for this visit:  Major depressive disorder, recurrent episode, moderate (HCC)  Generalized anxiety disorder  Sleep apnea with hypersomnolence  Marital conflict  Other orders -     vortioxetine HBr (TRINTELLIX) 10 MG TABS tablet; Take 1 tablet (10 mg total) by mouth daily.    Greater than 50% of 30 min  with patient was spent on counseling and coordination of care. We discussed When first seen he denied significant depressive symptoms although his wife thought he was depressed.    He realizes he also needs therapy for this tendency to self sabotage and also for marital conflict that has resulted.  Ongoing conflict with his wife. Was much less depressed and stressed when wife was away and now is more depressed and stressed again.  Continue activity in retirement.  Disc examples like classes, [facility].  Disc poss separation to diminish hostility in  the relationship, which in turn might help his depression bc she is highly critical of him.  Disc concerns about wife who is a pt.  She's moving to fast and angry.    Supportive therapy dealing with stress of separation and selling house on short notice. Rec wife get in touch with the office to evaluate possible adverse reaction to med.  Trintellix 10 mg daily.  Give it longer to work.  Hypersomnolence predates antidepressants and failed to respond to armodafinil but is improved though not resolved with Ritalin 10 mg 4 times daily  Sober for years but decided to restart AA and finish what he's started and he thinks it's fantastic. Still enjoys that.   Talk to PCP about his low BP possibly making him sluggish and less reactive.  His wife had accused him of having dementia however he had neuropsych testing in [place] which ruled out dementia.  After Visit Summary for patient specific instructions  FU 2 months  Lynder Parents, MD, DFAPA   Future Appointments  Date Time Provider [place]  10/08/2021  8:15 AM Criselda Peaches, DPM [facility]  10/16/2021  7:45 AM Hayden Pedro, MD [facility] None  10/20/2021 10:30 AM Cottle, Billey Co., MD [facility] None  10/21/2021  9:45 AM Frann Rider, NP [facility] None    No orders of the defined types were placed in this encounter.   -------------------------------

## 2021-09-21 ENCOUNTER — Telehealth: Payer: Self-pay | Admitting: Psychiatry

## 2021-09-21 ENCOUNTER — Other Ambulatory Visit: Payer: Self-pay | Admitting: Psychiatry

## 2021-09-21 MED ORDER — VORTIOXETINE HBR 10 MG PO TABS: 10.0000 mg | ORAL_TABLET | Freq: Every day | ORAL | 0 refills | Status: DC

## 2021-09-21 NOTE — Telephone Encounter (Signed)
Pt advised that Dr. Clovis Pu wrote a 30 day script for Trintellix.  It will cost him $200.  If Dr. Clovis Pu will right him a 90 day script and it goes through [facility], it will cost him $125.  Pls see if he will write a new script.  He didn't have a phone number or an address for [facility].  When I looked online I found this, "The best way is to call your doctor and ask to have a new 90-day prescription sent to [facility] electronically."  So I assume that's all you need to send it.  Next appt 11/15

## 2021-09-21 NOTE — Telephone Encounter (Signed)
Please review

## 2021-09-21 NOTE — Telephone Encounter (Signed)
Ok 90 day sent

## 2021-10-08 ENCOUNTER — Ambulatory Visit (INDEPENDENT_AMBULATORY_CARE_PROVIDER_SITE_OTHER): Payer: Medicare Other | Admitting: Podiatry

## 2021-10-08 ENCOUNTER — Other Ambulatory Visit: Payer: Self-pay

## 2021-10-08 DIAGNOSIS — E119 Type 2 diabetes mellitus without complications: Secondary | ICD-10-CM

## 2021-10-08 DIAGNOSIS — L84 Corns and callosities: Secondary | ICD-10-CM | POA: Diagnosis not present

## 2021-10-08 DIAGNOSIS — L603 Nail dystrophy: Secondary | ICD-10-CM

## 2021-10-12 NOTE — Progress Notes (Signed)
  Subjective:  Patient ID: Jermaine Waters, male    DOB: Aug 31, 1946,  MRN: [id number]  Chief Complaint  Patient presents with   Nail Problem    Thick painful toenails, 3 month follow up   Diabetes     diabetic foot care & exam    75 y.o. male presents with the above complaint. History confirmed with patient.  Nails are thickened elongated, the subungual hematoma seems to be growing out now  Objective:  Physical Exam: warm, good capillary refill, no trophic changes or ulcerative lesions, normal DP and PT pulses and normal sensory exam.  Left hallux prior subungual hematoma is healing.  Thickened elongated nails bilaterally with some subungual debris.  The bilateral foot submet 5 has a painful callus. Assessment:   1. Callus of foot   2. Well controlled type 2 diabetes mellitus ([place])   3. Nail dystrophy       Plan:  Patient was evaluated and treated and all questions answered.  Patient educated on diabetes. Discussed proper diabetic foot care and discussed risks and complications of disease. Educated patient in depth on reasons to return to the office immediately should he/she discover anything concerning or new on the feet. All questions answered. Discussed proper shoes as well.     All symptomatic hyperkeratoses were safely debrided with a sterile #15 blade to patient's level of comfort without incident. We discussed preventative and palliative care of these lesions including supportive and accommodative shoegear, padding, prefabricated and custom molded accommodative orthoses, use of a pumice stone and lotions/creams daily.   Return in about 3 months (around 01/08/2022) for at risk diabetic foot care.

## 2021-10-16 ENCOUNTER — Encounter (INDEPENDENT_AMBULATORY_CARE_PROVIDER_SITE_OTHER): Payer: Medicare Other | Admitting: Ophthalmology

## 2021-10-16 ENCOUNTER — Other Ambulatory Visit: Payer: Self-pay

## 2021-10-16 DIAGNOSIS — H34812 Central retinal vein occlusion, left eye, with macular edema: Secondary | ICD-10-CM | POA: Diagnosis not present

## 2021-10-16 DIAGNOSIS — H43813 Vitreous degeneration, bilateral: Secondary | ICD-10-CM

## 2021-10-16 DIAGNOSIS — E113591 Type 2 diabetes mellitus with proliferative diabetic retinopathy without macular edema, right eye: Secondary | ICD-10-CM

## 2021-10-16 DIAGNOSIS — E113512 Type 2 diabetes mellitus with proliferative diabetic retinopathy with macular edema, left eye: Secondary | ICD-10-CM | POA: Diagnosis not present

## 2021-10-20 ENCOUNTER — Ambulatory Visit (INDEPENDENT_AMBULATORY_CARE_PROVIDER_SITE_OTHER): Payer: Medicare Other | Admitting: Psychiatry

## 2021-10-20 ENCOUNTER — Other Ambulatory Visit: Payer: Self-pay

## 2021-10-20 ENCOUNTER — Encounter: Payer: Self-pay | Admitting: Psychiatry

## 2021-10-20 DIAGNOSIS — F411 Generalized anxiety disorder: Secondary | ICD-10-CM | POA: Diagnosis not present

## 2021-10-20 DIAGNOSIS — F331 Major depressive disorder, recurrent, moderate: Secondary | ICD-10-CM

## 2021-10-20 DIAGNOSIS — G4733 Obstructive sleep apnea (adult) (pediatric): Secondary | ICD-10-CM

## 2021-10-20 DIAGNOSIS — G4710 Hypersomnia, unspecified: Secondary | ICD-10-CM | POA: Diagnosis not present

## 2021-10-20 DIAGNOSIS — G4730 Sleep apnea, unspecified: Secondary | ICD-10-CM

## 2021-10-20 DIAGNOSIS — F341 Dysthymic disorder: Secondary | ICD-10-CM | POA: Diagnosis not present

## 2021-10-20 DIAGNOSIS — Z630 Problems in relationship with spouse or partner: Secondary | ICD-10-CM | POA: Diagnosis not present

## 2021-10-20 NOTE — Progress Notes (Signed)
[facility] [address] [place]. [place] [id number] [phone]       OFFICE FOLLOW UP NOTE  [name] Date of Birth:  February 14, 1946 Medical Record Number:  [id number]   Primary neurologist: Dr. Brett Fairy Reason for visit: Initial CPAP follow-up (new machine)    SUBJECTIVE:   CHIEF COMPLAINT:  Chief Complaint  Patient presents with   Obstructive Sleep Apnea    Rm 2 alone Pt is well, CPAP is doing good and is helping     HPI:   Jermaine Waters is a 75 y.o. with longstanding sleep apnea.  Previously seen by Dr. Brett Fairy 10/07/2020 and completed HST to confirm baseline and obtain new CPAP machine.  Returns today for initial CPAP compliance visit since obtaining new machine which was started on 09/02/2021.  Review of compliance report as below -excellent compliance 100% with residual AHI 2.2.  He has been doing well with new machine - tolerates well - continues to use nasal pillows.  He does report during the day time sitting and reading or just relaxing, he will notice where he will "forget" to breath as if his brain isn't telling his body to breath - he questions if there is anything that needs to be done about that  Epworth sleepiness scale 9/24 (prior 14/24) No other concerns at this time            ROS:   14 system review of systems performed and negative with exception of those listed in HPI  PMH:  Past Medical History:  Diagnosis Date   Anxiety    Arthritis    Cancer ([place])    skin CA removed   Depression    Diabetes mellitus    Diabetes mellitus without complication ([place])    Type 1   Elevated PSA 11/2014   PCP ordered for patient to take Cipro for 21 days   Erectile dysfunction    Hypercholesteremia    Skin cancer of face    Sleep apnea    wears CPAP nightly    PSH:  Past Surgical History:  Procedure Laterality Date   APPENDECTOMY     CHOLECYSTECTOMY     COLONOSCOPY     EYE SURGERY Bilateral    laser   ROTATOR  CUFF REPAIR Right    SEPTOPLASTY Bilateral 12/11/2014   Procedure: NASAL BILATERAL SEPTOPLASTY;  Surgeon: Jerrell Belfast, MD;  Location: [facility];  Service: ENT;  Laterality: Bilateral;   SHOULDER ARTHROSCOPY WITH ROTATOR CUFF REPAIR AND SUBACROMIAL DECOMPRESSION Right 04/05/2013   Procedure: RIGHT SHOULDER ARTHROSCOPY SUBACROMIAL DECOMPRESSION DISTAL CLAVICLE RESECTION AND ROTATOR CUFF REPAIR ;  Surgeon: Marin Shutter, MD;  Location: [facility];  Service: Orthopedics;  Laterality: Right;   TONSILLECTOMY     TURBINATE REDUCTION N/A 12/11/2014   Procedure: INFERIOR TURBINATE REDUCTION;  Surgeon: Jerrell Belfast, MD;  Location: [facility] OR;  Service: ENT;  Laterality: N/A;    Social History:  Social History   Socioeconomic History   Marital status: Married    Spouse name: Not on file   Number of children: 2   Years of education: Not on file   Highest education level: Not on file  Occupational History   Occupation: Retired    Fish farm manager: [employer]  Tobacco Use   Smoking status: Former    Packs/day: 2.00    Years: 15.00    Pack years: 30.00    Types: Cigarettes    Quit date: 1981    Years since quitting: 10.9  Smokeless tobacco: Never  Substance and Sexual Activity   Alcohol use: No    Comment: 1986   Drug use: No   Sexual activity: Not on file  Other Topics Concern   Not on file  Social History Narrative    Merged History Encounter        Social Determinants of Health   Financial Resource Strain: Not on file  Food Insecurity: Not on file  Transportation Needs: Not on file  Physical Activity: Not on file  Stress: Not on file  Social Connections: Not on file  Intimate Partner Violence: Not on file    Family History:  Family History  Problem Relation Age of Onset   Cancer Father        Esophageal/gastric cancer   Cancer Mother        Metastatic cancer   Rheum arthritis Mother    Colon cancer Neg Hx    Pancreatic cancer Neg Hx    Stomach cancer Neg Hx     Medications:    Current Outpatient Medications on File Prior to Visit  Medication Sig Dispense Refill   ciclopirox (PENLAC) 8 % solution Apply topically at bedtime. Apply over nail and surrounding skin. Apply daily over previous coat. After seven (7) days, may remove with alcohol and continue cycle. 6.6 mL 2   CONTOUR NEXT TEST test strip      Cyanocobalamin (VITAMIN B 12) 500 MCG TABS Take 2,000 Units by mouth.     magnesium 30 MG tablet Take 30 mg by mouth 2 (two) times daily.     methylphenidate (RITALIN) 10 MG tablet Take 1 tablet (10 mg total) by mouth 4 (four) times daily. (Patient taking differently: Take 20 mg by mouth 4 (four) times daily.) 120 tablet 0   NOVOLOG 100 UNIT/ML injection U IN INSULIN PUMP .APPROXIMATELY 80 UNITS A DAY  6   pravastatin (PRAVACHOL) 10 MG tablet 10 mg. Taking 1/2 tab of 20mg      PROLENSA 0.07 % SOLN INT 1 GTT IN OS HS  6   ramipril (ALTACE) 5 MG capsule Take 5 mg by mouth 2 (two) times daily.     sildenafil (REVATIO) 20 MG tablet Take 20 mg by mouth as needed (take 2-5 tablets as needed for erections).     vortioxetine HBr (TRINTELLIX) 10 MG TABS tablet Take 1 tablet (10 mg total) by mouth daily. 90 tablet 0   No current facility-administered medications on file prior to visit.    Allergies:  No Known Allergies    OBJECTIVE:  Physical Exam  Vitals:   10/21/21 1032  BP: (!) 141/56  Pulse: 70  Weight: 153 lb (69.4 kg)  Height: 5\' 5"  (1.651 m)   Body mass index is 25.46 kg/m. No results found.  General: well developed, well nourished, very pleasant elderly Caucasian male, seated, in no evident distress Head: head normocephalic and atraumatic.  Neck circumference 16.25" Neck: supple with no carotid or supraclavicular bruits Cardiovascular: regular rate and rhythm, no murmurs    Neurologic Exam Mental Status: Awake and fully alert. Oriented to place and time. Recent and remote memory intact. Attention span, concentration and fund of knowledge appropriate.  Mood and affect appropriate.  Cranial Nerves: Pupils equal, briskly reactive to light. Extraocular movements full without nystagmus. Visual fields full to confrontation. Hearing intact. Facial sensation intact. Face, tongue, palate moves normally and symmetrically.  Motor: Normal bulk and tone. Normal strength in all tested extremity muscles Sensory.: intact to touch , pinprick ,  position and vibratory sensation.  Coordination: Rapid alternating movements normal in all extremities. Finger-to-nose and heel-to-shin performed accurately bilaterally. Gait and Station: Arises from chair without difficulty. Stance is normal. Gait demonstrates normal stride length and balance Reflexes: 1+ and symmetric. Toes downgoing.      HST 11/03/2020 Per Dr. Brett Fairy: IMPRESSION: OSA (obstructive sleep apnea)  Summary & Diagnosis:      This HST confirms the presence of severe OSA - the AHI here was  48/h, the REM AHI was 59/h. The patient spent 32.7 minutes in  hypoxia and all night in supine position.          ASSESSMENT/PLAN: Jermaine Waters is a 74 y.o. year old male    Complex sleep apnea syndrome  -Continue current settings -Continue nightly usage for adequate CPAP management -will discuss concerns of day time apnea while awake with Dr. Brett Fairy - unsure if related to CSA or if further eval is needed from our standpoint -Routinely follow with DME company for any needed supplies or CPAP related concerns   Orders Placed This Encounter  Procedures   For home use only DME continuous positive airway pressure (CPAP)    Order Specific Question:   Length of Need    Answer:   Lifetime    Order Specific Question:   Patient has OSA or probable OSA    Answer:   Yes    Order Specific Question:   Is the patient currently using CPAP in the home    Answer:   Yes    Order Specific Question:   Settings    Answer:   Autotitration    Order Specific Question:   CPAP supplies needed    Answer:   Mask,  headgear, cushions, filters, heated tubing and water chamber       Follow up in 1 year or call earlier if needed   CC:  PCP: Leanna Battles (Inactive)   [facility] provider: Dr. Brett Fairy   I spent 24 minutes of face-to-face and non-face-to-face time with patient.  This included previsit chart review, lab review, study review, order entry, electronic health record documentation, patient education and discussion regarding longstanding history of sleep apnea, review and discussion of compliance report, educated on importance of continued nightly usage and answered all other questions to patient satisfaction   Frann Rider, [facility]  [facility] [address] [place], [place] [address]  Phone [phone] Fax [phone] Note: This document was prepared with digital dictation and possible smart phrase technology. Any transcriptional errors that result from this process are unintentional.

## 2021-10-20 NOTE — Progress Notes (Signed)
Jermaine Waters [id number] 22-Dec-1945 75 y.o.   Subjective:   Patient ID:  Jermaine Waters is a 75 y.o. (DOB 1946/08/27) male.  Chief Complaint:  Chief Complaint  Patient presents with   Follow-up   Depression    Depression        Associated symptoms include decreased concentration and fatigue. Jermaine Waters presents to the office today for follow-up of mood concerns and hypersomnolence.    Patient was seen August 08, 2019.  The assessment was depression.  The following was done: STOP  bupropion 75 mg. Start bupropion XL 150 mg tablets 1 each morning for 2 weeks. If no side effects such as jitteriness, then increase to 2 tablets each morning which equals 300 mg daily. He continued Ritalin 10 QID.  Patient was seen October 10, 2019.  He had a partial response with Wellbutrin XL 300 mg daily.  He was tolerating it and therefore the dosage was increased to 450 mg a day. He continued Ritalin 10 mg 4 times daily No Covid problems.   Didn't notice much difference with increase Wellbutrin.  Wife thinks he's a little more lethargic.  In some ways a little happier but really struggling with wife.  Pending temporary separation for a few months to try to provide some healing.  Hopefully will help both of them.   Wife concerned about Ritalin Rx for alertness DT OSA and central apnea that even affects him while awake.  PT meds could fall asleep driving.  Ritalin helps and CPAP helps.  Helps alerness and no insomnia.  Wonders about SE of it. A little more optimistic and less tense.  Therapy helpng also. No SE.  Not jitttery nor edgy.  Wife critical that he's less responsive, but maybe I'm less on edge. Energy is fine.  Interests hard to answer bc not a lot of outside activities left DT Covid.  Couple of small groups meeting with Zoom.  Most of free time before was singing but can't do it now. Things changed and wants meds and therapy.  PCP started Wellbutrin a couple of weeks ago. All my life  self-sabotage with jobs and has to restart.  Fear of failure.  Some worsening health issues dropping productivity.  Wife frustrated with him and constantly advising and critical.  Marital stress and unhappiness worse bc together all the time. Plan because of residual depression consideration was given to augment with Abilify.  He was in the process of moving and he felt that perhaps after the move he might have a different view and he wanted to defer.  So no meds were changed.  Appointment Apr 07, 2020, the following was noted: He did get moved.  Still having conflict with his wife which is a chronic stress. Pt reports that mood is Anxious, Depressed and Dysphoric and describes depression and anxiety as mild or mild to moderate but overall improved since last visit.. Anxiety symptoms include: Excessive Worry, avoidant of stress or pressure or commitments. . Pt reports no sleep issues and 7 hours but prefers 8 hours. Pt reports that appetite is good. Pt reports that energy is poor and anhedonia, poor motivation and withdrawn from usual activities. Concentration is down slightly. Suicidal thoughts:  denied by patient. Plan:  No med changes  05/21/2020 appointment the following is noted: Urgent appt at his request. One concern is more anxiety and procrastinates.[place] if he's working on the wrong problem.  Jermaine Waters of everything.  ? Fear of failure.  Easily stressed by small  demands.  Stress averse and not operating at full capacity. No history of anxiety meds.   Counseling. Ritalin 10 QID for hypersomnolence but questions ADD bc is distractible.  Sleep apnea for 20 years on CPAP.  Chronically sleepy. Plan: no med changes:  08/26/2020 appointment with the following noted: Increased Wellbutrin to 450 mg has worked fine. Feeling great.  Only negative is sleepy if sits down to read. Sleepy all his life and long term CPAP user.  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 No trouble with AK since  July 20 and no anxiety while she's gone.   Plan: Doing well.  No med changes  12/31/2020 appointment with following noted: Wife joined per patient request Overall OK but somewhat lifeless and disconnected and wonders if med related or not and if changes can be made. Otherwise not anxious.  Not joyful.   Wife [name] has different perspective in some areas.  She thinks he's too inactive.  She says since retirement he lacks drive to do much.  Rare exercise. Plan: Reduce the Lexapro to 5 mg nightly to see if it is having a dulling effect   02/25/2021 appointment with the following noted: No difference noted with less Lexapro.  Overall sort of cloudy and in my own world.  Wife CO his memory issues.  Depressed, frustrated, overwhelmed and probably worse after the reduction in meds. Caring for wife with TKR.  Stressful relationship very. Does go out 3-4 nights per week with various groups and some church activity.  Plan: DC Lexapro and start duloxetine 30 mg for a week then 60 mg daily. Continue Wellbutrin XL 450 mg daily Continue Ritalin 10 mg 4 times daily  06/01/2021 appointment with the following noted: A little more irritable.  Esp with his wife.  Wife complains about him.  He's afraid of saying something that upsets her. Chronic sleepiness is worse he thinks.  Thinks it's med related. Depression is unchanged.   Plan: Wean duloxetine using 20 mg capsules to 40 mg daily for 2 weeks then 20 mg daily for 2 weeks then stop it in order to prevent withdrawal symptoms.  He will pay attention to see if irritability and alertness are improved. Continue Wellbutrin XL 450 mg daily Continue Ritalin 10 mg tablets 4 times daily  08/20/2021 appointment with the following noted: At times doesn't think any meds are doing anything and doesn't remember meds helping him.  Wonders if he can get off the Wellbutrin Neuropsych testing is not suggesting dementia. Still chronic marital problems no better and hostile  relationship.  Talking about separation.  Working to sell the house. Not highly motivated.  Knows he'd feel better separated from wife.  Can find things he enjoys at home.  Thinks he's underachieved in life.  Occ feels hopeless.  Lists of things he needs to do with constant sense of procrastination on things necessary to do. Plan: Wean Wellbutrin and start Trintellix 5 mg daily for 4 to 7 days and if there is no nausea then increase to 10 mg daily.  09/16/21 appt noted: Just increased Trintellix to 20 mg daily 2 days ago.  No SE noted. Off Wellbutrin. Signed contract to sell house by end of month and wife gone off deep-end and not functioning and is at home in bed.  She's having more emotional outbursts and "definitely hyped".  He thinks she's hyper and talking constantly and more aggressive, slamming doors etc than usual. Overall he feels stronger and happier with change in meds.  He'd like to contrinue it.   Plan continue Trintellix 10 mg daily and Ritalin 10 mg 4 times daily  10/20/2021 appointment with the following noted: Trintellix working feeling stronger, lighter, generally more optimistic.  In complete contrast to wife.  I'm glad I'm on Trintellix. Satisfied with Ritalin.  Missed 4 days and was very tired.  On it 20 years. Overall satisfied with meds. Remains under high stress with wife and at home. No SE.  Past Psychiatric Medication Trials:  History Jermaine Waters.  On and off antidepressants for 20 years  Doesn't remember any being helpful.  lexapro 10  Remote Wellbutrin 450.  Doesn't remember the names of prior meds but remembers sexual SE with one. History of Nuvigil for hypersomnolence but he does not recall the response but apparently not good enough so Ritalin was RX in it's place  Review of Systems:  Review of Systems  Constitutional:  Positive for fatigue.  Eyes:  Positive for visual disturbance.  Cardiovascular:  Negative for chest pain and palpitations.   Gastrointestinal:  Negative for nausea.  Musculoskeletal:  Positive for arthralgias.  Neurological:  Negative for tremors and weakness.  Psychiatric/Behavioral:  Positive for decreased concentration.    Medications: I have reviewed the patient's current medications.  Current Outpatient Medications  Medication Sig Dispense Refill   B-12 TR 2000 MCG TBCR Take by mouth.     ciclopirox (PENLAC) 8 % solution Apply topically at bedtime. Apply over nail and surrounding skin. Apply daily over previous coat. After seven (7) days, may remove with alcohol and continue cycle. 6.6 mL 2   CONTOUR NEXT TEST test strip      Cyanocobalamin (VITAMIN B 12) 500 MCG TABS Take 2,000 Units by mouth.     methylphenidate (RITALIN) 10 MG tablet Take 1 tablet (10 mg total) by mouth 4 (four) times daily. (Patient taking differently: Take 20 mg by mouth 4 (four) times daily.) 120 tablet 0   NOVOLOG 100 UNIT/ML injection U IN INSULIN PUMP .APPROXIMATELY 80 UNITS A DAY  6   pravastatin (PRAVACHOL) 20 MG tablet 10 mg.     PROLENSA 0.07 % SOLN INT 1 GTT IN OS HS  6   ramipril (ALTACE) 5 MG capsule Take 5 mg by mouth 2 (two) times daily.     sildenafil (REVATIO) 20 MG tablet Take 20 mg by mouth as needed (take 2-5 tablets as needed for erections).     vortioxetine HBr (TRINTELLIX) 10 MG TABS tablet Take 1 tablet (10 mg total) by mouth daily. 90 tablet 0   magnesium 30 MG tablet Take 30 mg by mouth 2 (two) times daily. (Patient not taking: No sig reported)     No current facility-administered medications for this visit.    Medication Side Effects: None  Allergies: No Known Allergies  Past Medical History:  Diagnosis Date   Anxiety    Arthritis    Cancer ([place])    skin CA removed   Depression    Diabetes mellitus    Diabetes mellitus without complication ([place])    Type 1   Elevated PSA 11/2014   PCP ordered for patient to take Cipro for 21 days   Erectile dysfunction    Hypercholesteremia    Skin cancer of  face    Sleep apnea    wears CPAP nightly    Family History  Problem Relation Age of Onset   Cancer Father        Esophageal/gastric cancer   Cancer Mother  Metastatic cancer   Rheum arthritis Mother    Colon cancer Neg Hx    Pancreatic cancer Neg Hx    Stomach cancer Neg Hx     Social History   Socioeconomic History   Marital status: Married    Spouse name: Not on file   Number of children: 2   Years of education: Not on file   Highest education level: Not on file  Occupational History   Occupation: Retired    Fish farm manager: [employer]  Tobacco Use   Smoking status: Former    Packs/day: 2.00    Years: 15.00    Pack years: 30.00    Types: Cigarettes    Quit date: 1981    Years since quitting: 41.8   Smokeless tobacco: Never  Substance and Sexual Activity   Alcohol use: No    Comment: 1986   Drug use: No   Sexual activity: Not on file  Other Topics Concern   Not on file  Social History Narrative    Merged History Encounter        Social Determinants of Health   Financial Resource Strain: Not on file  Food Insecurity: Not on file  Transportation Needs: Not on file  Physical Activity: Not on file  Stress: Not on file  Social Connections: Not on file  Intimate Partner Violence: Not on file    Past Medical History, Surgical history, Social history, and Family history were reviewed and updated as appropriate.   Please see review of systems for further details on the patient's review from today.   Objective:   Physical Exam:  There were no vitals taken for this visit.  Physical Exam Constitutional:      General: He is not in acute distress. Musculoskeletal:        General: No deformity.  Neurological:     Mental Status: He is alert and oriented to person, place, and time.     Cranial Nerves: No dysarthria.     Coordination: Coordination normal.  Psychiatric:        Attention and Perception: Attention and perception normal. He does not  perceive auditory or visual hallucinations.        Mood and Affect: Mood is not depressed. Affect is not labile, blunt, angry or inappropriate.        Speech: Speech normal.        Behavior: Behavior normal. Behavior is not slowed. Behavior is cooperative.        Thought Content: Thought content normal. Thought content is not paranoid or delusional. Thought content does not include homicidal or suicidal ideation. Thought content does not include homicidal or suicidal plan.        Cognition and Memory: Cognition and memory normal.        Judgment: Judgment normal.     Comments: Insight intact Much less depressed and anxious with Trintellix    Lab Review:     Component Value Date/Time   NA 139 12/02/2014 0842   K 4.3 12/02/2014 0842   CL 106 12/02/2014 0842   CO2 25 12/02/2014 0842   GLUCOSE 223 (H) 12/02/2014 0842   BUN 24 (H) 12/02/2014 0842   CREATININE 0.87 12/02/2014 0842   CALCIUM 8.8 12/02/2014 0842   GFRNONAA 88 (L) 12/02/2014 0842   GFRAA >90 12/02/2014 0842       Component Value Date/Time   WBC 4.9 12/02/2014 0843   RBC 4.90 12/02/2014 0843   HGB 14.8 12/02/2014 0843   HCT  44.1 12/02/2014 0843   PLT 226 12/02/2014 0843   MCV 90.0 12/02/2014 0843   MCH 30.2 12/02/2014 0843   MCHC 33.6 12/02/2014 0843   RDW 13.1 12/02/2014 0843    No results found for: POCLITH, LITHIUM   No results found for: PHENYTOIN, PHENOBARB, VALPROATE, CBMZ   .res Assessment: Plan:    Jermaine Waters was seen today for follow-up and depression.  Diagnoses and all orders for this visit:  Major depressive disorder, recurrent episode, moderate (HCC)  Generalized anxiety disorder  Sleep apnea with hypersomnolence  Early onset dysthymia  Marital conflict  Obstructive sleep apnea    Greater than 50% of 30 min  with patient was spent on counseling and coordination of care. We discussed When first seen he denied significant depressive symptoms although his wife thought he was depressed.     He realizes he also needs therapy for this tendency to self sabotage and also for marital conflict that has resulted.  Ongoing conflict with his wife. Was much less depressed and stressed when wife was away .  Continue activity in retirement.  Disc examples like classes, [facility].  Disc poss separation to diminish hostility in the relationship, which in turn might help his depression bc she is highly critical of him.  Disc concerns about wife who is a pt.  She's moving to fast and angry.    Supportive therapy dealing with stress of separation and selling house on short notice.  Downsizing.  Making progress.  Asked about helping wife.  Answered his questions about how to help his wife bc she is overwhelmed.  Trintellix 10 mg daily.  No change indicated with good response and no SE.  Hypersomnolence predates antidepressants and failed to respond to armodafinil but is improved though not resolved with Ritalin 10 mg 4 times daily  Sober for years but decided to restart AA and finish what he's started and he thinks it's fantastic. Still enjoys that.   Talk to PCP about his low BP possibly making him sluggish and less reactive.  His wife had accused him of having dementia however he had neuropsych testing in [place] which ruled out dementia.  After Visit Summary for patient specific instructions  FU 3 months  Lynder Parents, MD, DFAPA   Future Appointments  Date Time Provider [place]  10/21/2021  9:45 AM Frann Rider, NP [facility] None  12/11/2021  7:30 AM Hayden Pedro, MD [facility] None  01/21/2022  8:45 AM McDonald, Stephan Minister, DPM [facility]    No orders of the defined types were placed in this encounter.   -------------------------------

## 2021-10-21 ENCOUNTER — Ambulatory Visit (INDEPENDENT_AMBULATORY_CARE_PROVIDER_SITE_OTHER): Payer: Medicare Other | Admitting: Adult Health

## 2021-10-21 ENCOUNTER — Encounter: Payer: Self-pay | Admitting: Adult Health

## 2021-10-21 VITALS — BP 141/56 | HR 70 | Ht 65.0 in | Wt 153.0 lb

## 2021-10-21 DIAGNOSIS — G4731 Primary central sleep apnea: Secondary | ICD-10-CM | POA: Diagnosis not present

## 2021-10-21 NOTE — Patient Instructions (Signed)
Continue current nightly use of CPAP     Followup in the future with me in 1 year or call earlier if needed       Thank you for coming to see [place] at [facility]. I hope we have been able to provide you high quality care today.  You may receive a patient satisfaction survey over the next few weeks. We would appreciate your feedback and comments so that we may continue to improve ourselves and the health of our patients.

## 2021-11-02 ENCOUNTER — Encounter: Payer: Self-pay | Admitting: Adult Health

## 2021-11-11 DIAGNOSIS — E10319 Type 1 diabetes mellitus with unspecified diabetic retinopathy without macular edema: Secondary | ICD-10-CM | POA: Diagnosis not present

## 2021-11-11 DIAGNOSIS — Z4681 Encounter for fitting and adjustment of insulin pump: Secondary | ICD-10-CM | POA: Diagnosis not present

## 2021-11-11 DIAGNOSIS — I10 Essential (primary) hypertension: Secondary | ICD-10-CM | POA: Diagnosis not present

## 2021-11-11 DIAGNOSIS — Z794 Long term (current) use of insulin: Secondary | ICD-10-CM | POA: Diagnosis not present

## 2021-11-11 DIAGNOSIS — E785 Hyperlipidemia, unspecified: Secondary | ICD-10-CM | POA: Diagnosis not present

## 2021-11-18 DIAGNOSIS — Z85828 Personal history of other malignant neoplasm of skin: Secondary | ICD-10-CM | POA: Diagnosis not present

## 2021-11-18 DIAGNOSIS — D485 Neoplasm of uncertain behavior of skin: Secondary | ICD-10-CM | POA: Diagnosis not present

## 2021-11-18 DIAGNOSIS — L821 Other seborrheic keratosis: Secondary | ICD-10-CM | POA: Diagnosis not present

## 2021-11-18 DIAGNOSIS — L738 Other specified follicular disorders: Secondary | ICD-10-CM | POA: Diagnosis not present

## 2021-11-18 DIAGNOSIS — L812 Freckles: Secondary | ICD-10-CM | POA: Diagnosis not present

## 2021-11-18 DIAGNOSIS — D225 Melanocytic nevi of trunk: Secondary | ICD-10-CM | POA: Diagnosis not present

## 2021-12-10 ENCOUNTER — Other Ambulatory Visit: Payer: Self-pay | Admitting: Psychiatry

## 2021-12-11 ENCOUNTER — Encounter (INDEPENDENT_AMBULATORY_CARE_PROVIDER_SITE_OTHER): Payer: Medicare Other | Admitting: Ophthalmology

## 2021-12-11 ENCOUNTER — Other Ambulatory Visit: Payer: Self-pay

## 2021-12-11 DIAGNOSIS — E113512 Type 2 diabetes mellitus with proliferative diabetic retinopathy with macular edema, left eye: Secondary | ICD-10-CM

## 2021-12-11 DIAGNOSIS — E113591 Type 2 diabetes mellitus with proliferative diabetic retinopathy without macular edema, right eye: Secondary | ICD-10-CM | POA: Diagnosis not present

## 2021-12-11 DIAGNOSIS — H34812 Central retinal vein occlusion, left eye, with macular edema: Secondary | ICD-10-CM | POA: Diagnosis not present

## 2021-12-11 DIAGNOSIS — H43813 Vitreous degeneration, bilateral: Secondary | ICD-10-CM | POA: Diagnosis not present

## 2022-01-20 ENCOUNTER — Ambulatory Visit (INDEPENDENT_AMBULATORY_CARE_PROVIDER_SITE_OTHER): Payer: Medicare Other | Admitting: Psychiatry

## 2022-01-20 ENCOUNTER — Encounter: Payer: Self-pay | Admitting: Psychiatry

## 2022-01-20 ENCOUNTER — Other Ambulatory Visit: Payer: Self-pay

## 2022-01-20 DIAGNOSIS — F331 Major depressive disorder, recurrent, moderate: Secondary | ICD-10-CM

## 2022-01-20 DIAGNOSIS — F341 Dysthymic disorder: Secondary | ICD-10-CM

## 2022-01-20 DIAGNOSIS — Z630 Problems in relationship with spouse or partner: Secondary | ICD-10-CM

## 2022-01-20 DIAGNOSIS — F411 Generalized anxiety disorder: Secondary | ICD-10-CM | POA: Diagnosis not present

## 2022-01-20 DIAGNOSIS — G4733 Obstructive sleep apnea (adult) (pediatric): Secondary | ICD-10-CM

## 2022-01-20 DIAGNOSIS — G4710 Hypersomnia, unspecified: Secondary | ICD-10-CM | POA: Diagnosis not present

## 2022-01-20 DIAGNOSIS — G4730 Sleep apnea, unspecified: Secondary | ICD-10-CM | POA: Diagnosis not present

## 2022-01-20 MED ORDER — VORTIOXETINE HBR 10 MG PO TABS: 10.0000 mg | ORAL_TABLET | Freq: Every day | ORAL | 1 refills | Status: DC

## 2022-01-20 NOTE — Progress Notes (Signed)
Jermaine Waters [id number] 24-Aug-1946 76 y.o.   Subjective:   Patient ID:  Jermaine Waters is a 76 y.o. (DOB 12/08/1945) male.  Chief Complaint:  Chief Complaint  Patient presents with   Follow-up   Depression   ADD    Depression        Associated symptoms include decreased concentration and fatigue. Jermaine Waters presents to the office today for follow-up of mood concerns and hypersomnolence.    Patient was seen August 08, 2019.  The assessment was depression.  The following was done: STOP  bupropion 75 mg. Start bupropion XL 150 mg tablets 1 each morning for 2 weeks. If no side effects such as jitteriness, then increase to 2 tablets each morning which equals 300 mg daily. He continued Ritalin 10 QID.  Patient was seen October 10, 2019.  He had a partial response with Wellbutrin XL 300 mg daily.  He was tolerating it and therefore the dosage was increased to 450 mg a day. He continued Ritalin 10 mg 4 times daily No Covid problems.   Didn't notice much difference with increase Wellbutrin.  Wife thinks he's a little more lethargic.  In some ways a little happier but really struggling with wife.  Pending temporary separation for a few months to try to provide some healing.  Hopefully will help both of them.   Wife concerned about Ritalin Rx for alertness DT OSA and central apnea that even affects him while awake.  PT meds could fall asleep driving.  Ritalin helps and CPAP helps.  Helps alerness and no insomnia.  Wonders about SE of it. A little more optimistic and less tense.  Therapy helpng also. No SE.  Not jitttery nor edgy.  Wife critical that he's less responsive, but maybe I'm less on edge. Energy is fine.  Interests hard to answer bc not a lot of outside activities left DT Covid.  Couple of small groups meeting with Zoom.  Most of free time before was singing but can't do it now. Things changed and wants meds and therapy.  PCP started Wellbutrin a couple of weeks ago. All my  life self-sabotage with jobs and has to restart.  Fear of failure.  Some worsening health issues dropping productivity.  Wife frustrated with him and constantly advising and critical.  Marital stress and unhappiness worse bc together all the time. Plan because of residual depression consideration was given to augment with Abilify.  He was in the process of moving and he felt that perhaps after the move he might have a different view and he wanted to defer.  So no meds were changed.  Appointment Apr 07, 2020, the following was noted: He did get moved.  Still having conflict with his wife which is a chronic stress. Pt reports that mood is Anxious, Depressed and Dysphoric and describes depression and anxiety as mild or mild to moderate but overall improved since last visit.. Anxiety symptoms include: Excessive Worry, avoidant of stress or pressure or commitments. . Pt reports no sleep issues and 7 hours but prefers 8 hours. Pt reports that appetite is good. Pt reports that energy is poor and anhedonia, poor motivation and withdrawn from usual activities. Concentration is down slightly. Suicidal thoughts:  denied by patient. Plan:  No med changes  05/21/2020 appointment the following is noted: Urgent appt at his request. One concern is more anxiety and procrastinates.Jermaine Waters if he's working on the wrong problem.  Jermaine Waters of everything.  ? Fear of failure.  Easily  stressed by small demands.  Stress averse and not operating at full capacity. No history of anxiety meds.   Counseling. Ritalin 10 QID for hypersomnolence but questions ADD bc is distractible.  Sleep apnea for 20 years on CPAP.  Chronically sleepy. Plan: no med changes:  08/26/2020 appointment with the following noted: Increased Wellbutrin to 450 mg has worked fine. Feeling great.  Only negative is sleepy if sits down to read. Sleepy all his life and long term CPAP user.  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 No trouble with AK  since July 20 and no anxiety while she's gone.   Plan: Doing well.  No med changes  12/31/2020 appointment with following noted: Wife joined per patient request Overall OK but somewhat lifeless and disconnected and wonders if med related or not and if changes can be made. Otherwise not anxious.  Not joyful.   Wife Jermaine Waters has different perspective in some areas.  She thinks he's too inactive.  She says since retirement he lacks drive to do much.  Rare exercise. Plan: Reduce the Lexapro to 5 mg nightly to see if it is having a dulling effect   02/25/2021 appointment with the following noted: No difference noted with less Lexapro.  Overall sort of cloudy and in my own world.  Wife CO his memory issues.  Depressed, frustrated, overwhelmed and probably worse after the reduction in meds. Caring for wife with TKR.  Stressful relationship very. Does go out 3-4 nights per week with various groups and some church activity.  Plan: DC Lexapro and start duloxetine 30 mg for a week then 60 mg daily. Continue Wellbutrin XL 450 mg daily Continue Ritalin 10 mg 4 times daily  06/01/2021 appointment with the following noted: A little more irritable.  Esp with his wife.  Wife complains about him.  He's afraid of saying something that upsets her. Chronic sleepiness is worse he thinks.  Thinks it's med related. Depression is unchanged.   Plan: Wean duloxetine using 20 mg capsules to 40 mg daily for 2 weeks then 20 mg daily for 2 weeks then stop it in order to prevent withdrawal symptoms.  He will pay attention to see if irritability and alertness are improved. Continue Wellbutrin XL 450 mg daily Continue Ritalin 10 mg tablets 4 times daily  08/20/2021 appointment with the following noted: At times doesn't think any meds are doing anything and doesn't remember meds helping him.  Wonders if he can get off the Wellbutrin Neuropsych testing is not suggesting dementia. Still chronic marital problems no better and  hostile relationship.  Talking about separation.  Working to sell the house. Not highly motivated.  Knows he'd feel better separated from wife.  Can find things he enjoys at home.  Thinks he's underachieved in life.  Occ feels hopeless.  Lists of things he needs to do with constant sense of procrastination on things necessary to do. Plan: Wean Wellbutrin and start Trintellix 5 mg daily for 4 to 7 days and if there is no nausea then increase to 10 mg daily.  09/16/21 appt noted: Just increased Trintellix to 10 mg daily 2 days ago.  No SE noted. Off Wellbutrin. Signed contract to sell house by end of month and wife gone off deep-end and not functioning and is at home in bed.  She's having more emotional outbursts and "definitely hyped".  He thinks she's hyper and talking constantly and more aggressive, slamming doors etc than usual. Overall he feels stronger and happier with  change in meds.  He'd like to contrinue it.   Plan continue Trintellix 10 mg daily and Ritalin 10 mg 4 times daily  10/20/2021 appointment with the following noted: Trintellix working feeling stronger, lighter, generally more optimistic.  In complete contrast to wife.  I'm glad I'm on Trintellix. Satisfied with Ritalin.  Missed 4 days and was very tired.  On it 20 years. Overall satisfied with meds. Remains under high stress with wife and at home. No SE. No med changes  01/20/2022 appointment with the following noted: No Still on Trintellix 10 and Ritalin and making life better. No SE. Better mood has been maintained. No problems with sleep.  Using CPAP Satisfied with Ritalin.  Missed 4 days and was very tired.  On it 20 years. Still sings in group.    Past Psychiatric Medication Trials:  History Jermaine Waters.  On and off antidepressants for 20 years  Doesn't remember any being helpful.  lexapro 10  Remote Wellbutrin 450.  Doesn't remember the names of prior meds but remembers sexual SE with one. History of Nuvigil  for hypersomnolence but he does not recall the response but apparently not good enough so Ritalin was RX in it's place  Review of Systems:  Review of Systems  Constitutional:  Positive for fatigue.  Eyes:  Positive for visual disturbance.  Respiratory:  Negative for wheezing.   Cardiovascular:  Negative for chest pain and palpitations.  Gastrointestinal:  Negative for nausea.  Musculoskeletal:  Positive for arthralgias.  Neurological:  Negative for tremors and weakness.  Psychiatric/Behavioral:  Positive for decreased concentration.    Medications: I have reviewed the patient's current medications.  Current Outpatient Medications  Medication Sig Dispense Refill   ciclopirox (PENLAC) 8 % solution Apply topically at bedtime. Apply over nail and surrounding skin. Apply daily over previous coat. After seven (7) days, may remove with alcohol and continue cycle. 6.6 mL 2   CONTOUR NEXT TEST test strip      Cyanocobalamin (VITAMIN B 12) 500 MCG TABS Take 2,000 Units by mouth.     magnesium 30 MG tablet Take 30 mg by mouth 2 (two) times daily.     methylphenidate (RITALIN) 10 MG tablet Take 1 tablet (10 mg total) by mouth 4 (four) times daily. 120 tablet 0   NOVOLOG 100 UNIT/ML injection U IN INSULIN PUMP .APPROXIMATELY 80 UNITS A DAY  6   pravastatin (PRAVACHOL) 10 MG tablet 10 mg. Taking 1/2 tab of 20mg      PROLENSA 0.07 % SOLN INT 1 GTT IN OS HS  6   ramipril (ALTACE) 5 MG capsule Take 5 mg by mouth 2 (two) times daily.     sildenafil (REVATIO) 20 MG tablet Take 20 mg by mouth as needed (take 2-5 tablets as needed for erections).     vortioxetine HBr (TRINTELLIX) 10 MG TABS tablet Take 1 tablet (10 mg total) by mouth daily. 90 tablet 1   No current facility-administered medications for this visit.    Medication Side Effects: None  Allergies: No Known Allergies  Past Medical History:  Diagnosis Date   Anxiety    Arthritis    Cancer ([place])    skin CA removed   Depression     Diabetes mellitus    Diabetes mellitus without complication ([place])    Type 1   Elevated PSA 11/2014   PCP ordered for patient to take Cipro for 21 days   Erectile dysfunction    Hypercholesteremia    Skin cancer  of face    Sleep apnea    wears CPAP nightly    Family History  Problem Relation Age of Onset   Cancer Father        Esophageal/gastric cancer   Cancer Mother        Metastatic cancer   Rheum arthritis Mother    Jermaine cancer Neg Hx    Pancreatic cancer Neg Hx    Stomach cancer Neg Hx     Social History   Socioeconomic History   Marital status: Married    Spouse name: Not on file   Number of children: 2   Years of education: Not on file   Highest education level: Not on file  Occupational History   Occupation: Retired    Fish farm manager: [employer]  Tobacco Use   Smoking status: Former    Packs/day: 2.00    Years: 15.00    Pack years: 30.00    Types: Cigarettes    Quit date: 1981    Years since quitting: 42.1   Smokeless tobacco: Never  Substance and Sexual Activity   Alcohol use: No    Comment: 1986   Drug use: No   Sexual activity: Not on file  Other Topics Concern   Not on file  Social History Narrative    Merged History Encounter        Social Determinants of Health   Financial Resource Strain: Not on file  Food Insecurity: Not on file  Transportation Needs: Not on file  Physical Activity: Not on file  Stress: Not on file  Social Connections: Not on file  Intimate Partner Violence: Not on file    Past Medical History, Surgical history, Social history, and Family history were reviewed and updated as appropriate.   Please see review of systems for further details on the patient's review from today.   Objective:   Physical Exam:  There were no vitals taken for this visit.  Physical Exam Constitutional:      General: He is not in acute distress. Musculoskeletal:        General: No deformity.  Neurological:     Mental Status: He is  alert and oriented to person, place, and time.     Cranial Nerves: No dysarthria.     Coordination: Coordination normal.  Psychiatric:        Attention and Perception: Attention and perception normal. He does not perceive auditory or visual hallucinations.        Mood and Affect: Mood is not depressed. Affect is not labile, blunt, angry or inappropriate.        Speech: Speech normal.        Behavior: Behavior normal. Behavior is not slowed. Behavior is cooperative.        Thought Content: Thought content normal. Thought content is not paranoid or delusional. Thought content does not include homicidal or suicidal ideation. Thought content does not include suicidal plan.        Cognition and Memory: Cognition and memory normal.        Judgment: Judgment normal.     Comments: Insight intact Much less depressed and anxious with Trintellix    Lab Review:     Component Value Date/Time   NA 139 12/02/2014 0842   K 4.3 12/02/2014 0842   CL 106 12/02/2014 0842   CO2 25 12/02/2014 0842   GLUCOSE 223 (H) 12/02/2014 0842   BUN 24 (H) 12/02/2014 0842   CREATININE 0.87 12/02/2014 0842   CALCIUM  8.8 12/02/2014 0842   GFRNONAA 88 (L) 12/02/2014 0842   GFRAA >90 12/02/2014 0842       Component Value Date/Time   WBC 4.9 12/02/2014 0843   RBC 4.90 12/02/2014 0843   HGB 14.8 12/02/2014 0843   HCT 44.1 12/02/2014 0843   PLT 226 12/02/2014 0843   MCV 90.0 12/02/2014 0843   MCH 30.2 12/02/2014 0843   MCHC 33.6 12/02/2014 0843   RDW 13.1 12/02/2014 0843    No results found for: POCLITH, LITHIUM   No results found for: PHENYTOIN, PHENOBARB, VALPROATE, CBMZ   .res Assessment: Plan:    Orvis was seen today for follow-up, depression and add.  Diagnoses and all orders for this visit:  Major depressive disorder, recurrent episode, moderate (HCC)  Generalized anxiety disorder  Sleep apnea with hypersomnolence  Early onset dysthymia  Obstructive sleep apnea  Marital  conflict  Other orders -     vortioxetine HBr (TRINTELLIX) 10 MG TABS tablet; Take 1 tablet (10 mg total) by mouth daily.     Greater than 50% of 30 min  with patient was spent on counseling and coordination of care. We discussed When first seen he denied significant depressive symptoms although his wife thought he was depressed.    He realizes he also needs therapy for this tendency to self sabotage and also for marital conflict that has resulted.  Ongoing conflict with his wife. Was much less depressed and stressed when wife was away .  Continue activity in retirement.  Disc examples like classes, [facility].  Disc poss separation to diminish hostility in the relationship, which in turn might help his depression bc she is highly critical of him.  Disc concerns about wife who is a pt.  She's moving to fast and angry.    Trintellix 10 mg daily.  No change indicated with good response and no SE.  Hypersomnolence predates antidepressants and failed to respond to armodafinil but is improved though not resolved with Ritalin 10 mg 4 times daily  Sober for years but decided to restart AA and finish what he's started and he thinks it's fantastic. Still enjoys that.   Talk to PCP about his low BP possibly making him sluggish and less reactive.  After Visit Summary for patient specific instructions  FU 4-6 months  Lynder Parents, MD, DFAPA   Future Appointments  Date Time Provider Gray  01/21/2022  8:45 AM Criselda Peaches, DPM [facility]  02/05/2022  7:30 AM Hayden Pedro, MD [facility] None  10/21/2022 10:45 AM Frann Rider, NP [facility] None    No orders of the defined types were placed in this encounter.    -------------------------------

## 2022-01-21 ENCOUNTER — Ambulatory Visit (INDEPENDENT_AMBULATORY_CARE_PROVIDER_SITE_OTHER): Payer: Medicare Other | Admitting: Podiatry

## 2022-01-21 DIAGNOSIS — B351 Tinea unguium: Secondary | ICD-10-CM | POA: Diagnosis not present

## 2022-01-21 DIAGNOSIS — E119 Type 2 diabetes mellitus without complications: Secondary | ICD-10-CM | POA: Diagnosis not present

## 2022-01-21 MED ORDER — TERBINAFINE HCL 250 MG PO TABS: 250.0000 mg | ORAL_TABLET | Freq: Every day | ORAL | 0 refills | Status: AC

## 2022-01-21 NOTE — Progress Notes (Signed)
°  Subjective:  Patient ID: Jermaine Waters, male    DOB: [birth year],  MRN: [id number]  Chief Complaint  Patient presents with   Diabetes    Diabetic foot exam, A1C  6.9   Nail Problem    76 y.o. male presents with the above complaint. History confirmed with patient.  Left hallux nail continues to be bothersome thickened elongated yellow and painful and does not seem to correct for him  Objective:  Physical Exam: warm, good capillary refill, no trophic changes or ulcerative lesions, normal DP and PT pulses and normal sensory exam.  Left hallux prior subungual hematoma is healing.  Thickened elongated nails bilaterally with some subungual debris.  The bilateral foot submet 5 has a painful callus. Assessment:   1. Onychomycosis   2. Well controlled type 2 diabetes mellitus ([place])       Plan:  Patient was evaluated and treated and all questions answered.  Patient educated on diabetes. Discussed proper diabetic foot care and discussed risks and complications of disease. Educated patient in depth on reasons to return to the office immediately should he/she discover anything concerning or new on the feet. All questions answered. Discussed proper shoes as well.   Continues to have a yellow discolored dystrophic thickened elongated hallux nail.  This was debrided in length and thickness today using a sharp nail nipper.  Ciclopirox has not been helpful we discussed further treatment options and I recommended Lamisil 90-day course.  Discussed use side effects and potential alternatives. Calluses are doing well and did not require debridement today  Return in about 3 months (around 04/20/2022) for at risk diabetic foot care, follow up after nail fungus treatment.

## 2022-01-27 DIAGNOSIS — H1033 Unspecified acute conjunctivitis, bilateral: Secondary | ICD-10-CM | POA: Diagnosis not present

## 2022-01-27 DIAGNOSIS — H524 Presbyopia: Secondary | ICD-10-CM | POA: Diagnosis not present

## 2022-01-27 DIAGNOSIS — H52223 Regular astigmatism, bilateral: Secondary | ICD-10-CM | POA: Diagnosis not present

## 2022-01-27 DIAGNOSIS — H5203 Hypermetropia, bilateral: Secondary | ICD-10-CM | POA: Diagnosis not present

## 2022-01-28 DIAGNOSIS — I739 Peripheral vascular disease, unspecified: Secondary | ICD-10-CM | POA: Diagnosis not present

## 2022-01-28 DIAGNOSIS — G4733 Obstructive sleep apnea (adult) (pediatric): Secondary | ICD-10-CM | POA: Diagnosis not present

## 2022-01-28 DIAGNOSIS — E785 Hyperlipidemia, unspecified: Secondary | ICD-10-CM | POA: Diagnosis not present

## 2022-01-28 DIAGNOSIS — I10 Essential (primary) hypertension: Secondary | ICD-10-CM | POA: Diagnosis not present

## 2022-01-28 DIAGNOSIS — E10319 Type 1 diabetes mellitus with unspecified diabetic retinopathy without macular edema: Secondary | ICD-10-CM | POA: Diagnosis not present

## 2022-01-28 DIAGNOSIS — Z794 Long term (current) use of insulin: Secondary | ICD-10-CM | POA: Diagnosis not present

## 2022-02-05 ENCOUNTER — Encounter (INDEPENDENT_AMBULATORY_CARE_PROVIDER_SITE_OTHER): Payer: Medicare Other | Admitting: Ophthalmology

## 2022-02-05 ENCOUNTER — Other Ambulatory Visit: Payer: Self-pay

## 2022-02-05 DIAGNOSIS — E113591 Type 2 diabetes mellitus with proliferative diabetic retinopathy without macular edema, right eye: Secondary | ICD-10-CM

## 2022-02-05 DIAGNOSIS — H43813 Vitreous degeneration, bilateral: Secondary | ICD-10-CM | POA: Diagnosis not present

## 2022-02-05 DIAGNOSIS — H34812 Central retinal vein occlusion, left eye, with macular edema: Secondary | ICD-10-CM

## 2022-02-05 DIAGNOSIS — E113512 Type 2 diabetes mellitus with proliferative diabetic retinopathy with macular edema, left eye: Secondary | ICD-10-CM | POA: Diagnosis not present

## 2022-02-12 ENCOUNTER — Other Ambulatory Visit: Payer: Self-pay | Admitting: Psychiatry

## 2022-02-19 ENCOUNTER — Other Ambulatory Visit: Payer: Self-pay

## 2022-02-19 ENCOUNTER — Other Ambulatory Visit: Payer: Self-pay | Admitting: Psychiatry

## 2022-02-19 MED ORDER — VORTIOXETINE HBR 10 MG PO TABS: 10.0000 mg | ORAL_TABLET | Freq: Every day | ORAL | 1 refills | Status: DC

## 2022-03-10 DIAGNOSIS — E10319 Type 1 diabetes mellitus with unspecified diabetic retinopathy without macular edema: Secondary | ICD-10-CM | POA: Diagnosis not present

## 2022-03-10 DIAGNOSIS — I10 Essential (primary) hypertension: Secondary | ICD-10-CM | POA: Diagnosis not present

## 2022-03-10 DIAGNOSIS — E1140 Type 2 diabetes mellitus with diabetic neuropathy, unspecified: Secondary | ICD-10-CM | POA: Diagnosis not present

## 2022-03-10 DIAGNOSIS — Z4681 Encounter for fitting and adjustment of insulin pump: Secondary | ICD-10-CM | POA: Diagnosis not present

## 2022-03-10 DIAGNOSIS — E785 Hyperlipidemia, unspecified: Secondary | ICD-10-CM | POA: Diagnosis not present

## 2022-03-10 DIAGNOSIS — Z794 Long term (current) use of insulin: Secondary | ICD-10-CM | POA: Diagnosis not present

## 2022-04-02 ENCOUNTER — Encounter (INDEPENDENT_AMBULATORY_CARE_PROVIDER_SITE_OTHER): Payer: Medicare Other | Admitting: Ophthalmology

## 2022-04-02 DIAGNOSIS — H43813 Vitreous degeneration, bilateral: Secondary | ICD-10-CM

## 2022-04-02 DIAGNOSIS — E113591 Type 2 diabetes mellitus with proliferative diabetic retinopathy without macular edema, right eye: Secondary | ICD-10-CM | POA: Diagnosis not present

## 2022-04-02 DIAGNOSIS — E113512 Type 2 diabetes mellitus with proliferative diabetic retinopathy with macular edema, left eye: Secondary | ICD-10-CM | POA: Diagnosis not present

## 2022-04-02 DIAGNOSIS — H34812 Central retinal vein occlusion, left eye, with macular edema: Secondary | ICD-10-CM | POA: Diagnosis not present

## 2022-04-08 DIAGNOSIS — Z20822 Contact with and (suspected) exposure to covid-19: Secondary | ICD-10-CM | POA: Diagnosis not present

## 2022-04-22 ENCOUNTER — Ambulatory Visit: Payer: Medicare Other | Admitting: Podiatry

## 2022-04-27 ENCOUNTER — Ambulatory Visit (INDEPENDENT_AMBULATORY_CARE_PROVIDER_SITE_OTHER): Payer: Medicare Other | Admitting: Podiatry

## 2022-04-27 DIAGNOSIS — B351 Tinea unguium: Secondary | ICD-10-CM

## 2022-04-27 MED ORDER — TERBINAFINE HCL 250 MG PO TABS: 250.0000 mg | ORAL_TABLET | Freq: Every day | ORAL | 0 refills | Status: DC

## 2022-04-27 NOTE — Progress Notes (Signed)
  Subjective:  Patient ID: Jermaine Waters, male    DOB: Jan 18, 1946,  MRN: [id number]  Chief Complaint  Patient presents with   Nail Problem    Thick painful toenails, 3 month follow up     76 y.o. male presents with the above complaint. History confirmed with patient.  Left hallux nail continues to be bothersome thickened elongated yellow does not feel like it is changed much  Objective:  Physical Exam: warm, good capillary refill, no trophic changes or ulcerative lesions, normal DP and PT pulses and normal sensory exam.  Thickened elongated nails bilaterally with some subungual debris.   Assessment:   1. Onychomycosis       Plan:  Patient was evaluated and treated and all questions answered.  Patient educated on diabetes. Discussed proper diabetic foot care and discussed risks and complications of disease. Educated patient in depth on reasons to return to the office immediately should he/she discover anything concerning or new on the feet. All questions answered. Discussed proper shoes as well.   Continues to have a yellow discolored dystrophic thickened elongated hallux nail.  This was debrided in length and thickness today using a sharp nail nipper.  Recommended another 90-day course of the Lamisil and this was sent to his pharmacy.  He is also interested in trying laser therapy at this point and he will be scheduled for this.  I discussed with him could also consider removal of the nail plate but this could lead to permanent damage to the nail plate and nailbed  Return in about 4 months (around 08/28/2022) for follow up after nail fungus treatment.

## 2022-05-17 ENCOUNTER — Ambulatory Visit (INDEPENDENT_AMBULATORY_CARE_PROVIDER_SITE_OTHER): Payer: Self-pay

## 2022-05-17 DIAGNOSIS — B351 Tinea unguium: Secondary | ICD-10-CM

## 2022-05-17 NOTE — Patient Instructions (Signed)

## 2022-05-17 NOTE — Progress Notes (Signed)
Patient presents today for the 1st laser treatment. Diagnosed with mycotic nail infection by Dr. Sherryle Lis.   Toenail most affected left 1st.  All other systems are negative.  Nails were filed thin. Laser therapy was administered to 1-5 toenails bilateral and patient tolerated the treatment well. All safety precautions were in place.   Patient reports taking Lamisil Rx as prescribed.    Follow up in 4 weeks for laser # 2.

## 2022-05-27 DIAGNOSIS — L820 Inflamed seborrheic keratosis: Secondary | ICD-10-CM | POA: Diagnosis not present

## 2022-05-27 DIAGNOSIS — D692 Other nonthrombocytopenic purpura: Secondary | ICD-10-CM | POA: Diagnosis not present

## 2022-05-27 DIAGNOSIS — D225 Melanocytic nevi of trunk: Secondary | ICD-10-CM | POA: Diagnosis not present

## 2022-05-27 DIAGNOSIS — L853 Xerosis cutis: Secondary | ICD-10-CM | POA: Diagnosis not present

## 2022-05-27 DIAGNOSIS — L570 Actinic keratosis: Secondary | ICD-10-CM | POA: Diagnosis not present

## 2022-05-27 DIAGNOSIS — D2262 Melanocytic nevi of left upper limb, including shoulder: Secondary | ICD-10-CM | POA: Diagnosis not present

## 2022-05-27 DIAGNOSIS — L812 Freckles: Secondary | ICD-10-CM | POA: Diagnosis not present

## 2022-05-27 DIAGNOSIS — L821 Other seborrheic keratosis: Secondary | ICD-10-CM | POA: Diagnosis not present

## 2022-05-27 DIAGNOSIS — Z85828 Personal history of other malignant neoplasm of skin: Secondary | ICD-10-CM | POA: Diagnosis not present

## 2022-06-10 DIAGNOSIS — E1040 Type 1 diabetes mellitus with diabetic neuropathy, unspecified: Secondary | ICD-10-CM | POA: Diagnosis not present

## 2022-06-10 DIAGNOSIS — E785 Hyperlipidemia, unspecified: Secondary | ICD-10-CM | POA: Diagnosis not present

## 2022-06-10 DIAGNOSIS — F900 Attention-deficit hyperactivity disorder, predominantly inattentive type: Secondary | ICD-10-CM | POA: Diagnosis not present

## 2022-06-10 DIAGNOSIS — I10 Essential (primary) hypertension: Secondary | ICD-10-CM | POA: Diagnosis not present

## 2022-06-10 DIAGNOSIS — G4733 Obstructive sleep apnea (adult) (pediatric): Secondary | ICD-10-CM | POA: Diagnosis not present

## 2022-06-10 DIAGNOSIS — I739 Peripheral vascular disease, unspecified: Secondary | ICD-10-CM | POA: Diagnosis not present

## 2022-06-10 DIAGNOSIS — Z794 Long term (current) use of insulin: Secondary | ICD-10-CM | POA: Diagnosis not present

## 2022-06-10 DIAGNOSIS — E10319 Type 1 diabetes mellitus with unspecified diabetic retinopathy without macular edema: Secondary | ICD-10-CM | POA: Diagnosis not present

## 2022-06-11 ENCOUNTER — Encounter (INDEPENDENT_AMBULATORY_CARE_PROVIDER_SITE_OTHER): Payer: Medicare Other | Admitting: Ophthalmology

## 2022-06-11 DIAGNOSIS — E113591 Type 2 diabetes mellitus with proliferative diabetic retinopathy without macular edema, right eye: Secondary | ICD-10-CM | POA: Diagnosis not present

## 2022-06-11 DIAGNOSIS — H35033 Hypertensive retinopathy, bilateral: Secondary | ICD-10-CM | POA: Diagnosis not present

## 2022-06-11 DIAGNOSIS — H43813 Vitreous degeneration, bilateral: Secondary | ICD-10-CM

## 2022-06-11 DIAGNOSIS — E113512 Type 2 diabetes mellitus with proliferative diabetic retinopathy with macular edema, left eye: Secondary | ICD-10-CM

## 2022-06-11 DIAGNOSIS — I10 Essential (primary) hypertension: Secondary | ICD-10-CM

## 2022-06-11 DIAGNOSIS — H34812 Central retinal vein occlusion, left eye, with macular edema: Secondary | ICD-10-CM

## 2022-06-14 ENCOUNTER — Ambulatory Visit (INDEPENDENT_AMBULATORY_CARE_PROVIDER_SITE_OTHER): Payer: Medicare Other

## 2022-06-14 DIAGNOSIS — B351 Tinea unguium: Secondary | ICD-10-CM

## 2022-06-14 NOTE — Progress Notes (Signed)
Patient presents today for the 2nd laser treatment. Diagnosed with mycotic nail infection by Dr. Sherryle Lis.   Toenail most affected left 1st.  All other systems are negative.  Nails were filed thin. Laser therapy was administered to 1-5 toenails bilateral and patient tolerated the treatment well. All safety precautions were in place.   Patient reports taking Lamisil Rx as prescribed.    Follow up in 4 weeks for laser # 3.

## 2022-07-06 ENCOUNTER — Ambulatory Visit: Payer: Medicare Other | Admitting: Psychiatry

## 2022-07-21 ENCOUNTER — Ambulatory Visit: Payer: Federal, State, Local not specified - PPO | Admitting: Psychiatry

## 2022-07-22 DIAGNOSIS — I10 Essential (primary) hypertension: Secondary | ICD-10-CM | POA: Diagnosis not present

## 2022-07-22 DIAGNOSIS — E10319 Type 1 diabetes mellitus with unspecified diabetic retinopathy without macular edema: Secondary | ICD-10-CM | POA: Diagnosis not present

## 2022-07-22 DIAGNOSIS — Z4681 Encounter for fitting and adjustment of insulin pump: Secondary | ICD-10-CM | POA: Diagnosis not present

## 2022-07-22 DIAGNOSIS — E785 Hyperlipidemia, unspecified: Secondary | ICD-10-CM | POA: Diagnosis not present

## 2022-07-22 DIAGNOSIS — Z794 Long term (current) use of insulin: Secondary | ICD-10-CM | POA: Diagnosis not present

## 2022-07-23 ENCOUNTER — Ambulatory Visit: Payer: Federal, State, Local not specified - PPO

## 2022-07-23 DIAGNOSIS — B351 Tinea unguium: Secondary | ICD-10-CM

## 2022-07-23 NOTE — Progress Notes (Signed)
Patient presents today for the 3rd laser treatment. Diagnosed with mycotic nail infection by Dr. Sherryle Lis.   Toenail most affected left 1st.  All other systems are negative.  Nails were filed thin. Laser therapy was administered to 1-5 toenails bilateral and patient tolerated the treatment well. All safety precautions were in place.   Patient reports taking Lamisil Rx as prescribed.    Follow up in 6 weeks for laser # 4.

## 2022-08-13 ENCOUNTER — Encounter (INDEPENDENT_AMBULATORY_CARE_PROVIDER_SITE_OTHER): Payer: Federal, State, Local not specified - PPO | Admitting: Ophthalmology

## 2022-08-13 DIAGNOSIS — H43813 Vitreous degeneration, bilateral: Secondary | ICD-10-CM | POA: Diagnosis not present

## 2022-08-13 DIAGNOSIS — E113512 Type 2 diabetes mellitus with proliferative diabetic retinopathy with macular edema, left eye: Secondary | ICD-10-CM

## 2022-08-13 DIAGNOSIS — H34812 Central retinal vein occlusion, left eye, with macular edema: Secondary | ICD-10-CM | POA: Diagnosis not present

## 2022-08-13 DIAGNOSIS — E113591 Type 2 diabetes mellitus with proliferative diabetic retinopathy without macular edema, right eye: Secondary | ICD-10-CM

## 2022-08-16 ENCOUNTER — Other Ambulatory Visit: Payer: Self-pay | Admitting: Podiatry

## 2022-08-30 ENCOUNTER — Ambulatory Visit (INDEPENDENT_AMBULATORY_CARE_PROVIDER_SITE_OTHER): Payer: Medicare Other | Admitting: Podiatry

## 2022-08-30 DIAGNOSIS — M7672 Peroneal tendinitis, left leg: Secondary | ICD-10-CM | POA: Diagnosis not present

## 2022-08-30 DIAGNOSIS — S93402A Sprain of unspecified ligament of left ankle, initial encounter: Secondary | ICD-10-CM | POA: Diagnosis not present

## 2022-08-30 DIAGNOSIS — B351 Tinea unguium: Secondary | ICD-10-CM

## 2022-08-30 NOTE — Patient Instructions (Signed)
Look for Voltaren gel at the pharmacy over the counter or online (also known as diclofenac 1% gel). Apply to the painful areas 3-4x daily with the supplied dosing card. Allow to dry for 10 minutes before going into socks/shoes   Ankle Sprain   An ankle sprain is a stretch or tear in one of the tough tissues (ligaments) that connect the bones in your ankle. An ankle sprain can happen when the ankle rolls outward (inversion sprain) or inward (eversion sprain). What are the causes? This condition is caused by rolling or twisting the ankle. What increases the risk? You are more likely to develop this condition if you play sports. What are the signs or symptoms? Symptoms of this condition include: Pain in your ankle. Swelling. Bruising. This may happen right after you sprain your ankle or 1-2 days later. Trouble standing or walking. How is this diagnosed? This condition is diagnosed with: A physical exam. During the exam, your doctor will press on certain parts of your foot and ankle and try to move them in certain ways. X-ray imaging. These may be taken to see how bad the sprain is and to check for broken bones. How is this treated? This condition may be treated with: A brace or splint. This is used to keep the ankle from moving until it heals. An elastic bandage. This is used to support the ankle. Crutches. Pain medicine. Surgery. This may be needed if the sprain is very bad. Physical therapy. This may help to improve movement in the ankle. Follow these instructions at home: If you have a brace or a splint: Wear the brace or splint as told by your doctor. Remove it only as told by your doctor. Loosen the brace or splint if your toes: Tingle. Lose feeling (become numb). Turn cold and blue. Keep the brace or splint clean. If the brace or splint is not waterproof: Do not let it get wet. Cover it with a watertight covering when you take a bath or a shower. If you have an elastic  bandage (dressing): Remove it to shower or bathe. Try not to move your ankle much, but wiggle your toes from time to time. This helps to prevent swelling. Adjust the dressing if it feels too tight. Loosen the dressing if your foot: Loses feeling. Tingles. Becomes cold and blue. Managing pain, stiffness, and swelling   Take over-the-counter and prescription medicines only as told by doctor. For 2-3 days, keep your ankle raised (elevated) above the level of your heart. If told, put ice on the injured area: If you have a removable brace or splint, remove it as told by your doctor. Put ice in a plastic bag. Place a towel between your skin and the bag. Leave the ice on for 20 minutes, 2-3 times a day. General instructions Rest your ankle. Do not use your injured leg to support your body weight until your doctor says that you can. Use crutches as told by your doctor. Do not use any products that contain nicotine or tobacco, such as cigarettes, e-cigarettes, and chewing tobacco. If you need help quitting, ask your doctor. Keep all follow-up visits as told by your doctor. Contact a doctor if: Your bruises or swelling are quickly getting worse. Your pain does not get better after you take medicine. Get help right away if: You cannot feel your toes or foot. Your foot or toes look blue. You have very bad pain that gets worse. Summary An ankle sprain is a stretch or  tear in one of the tough tissues (ligaments) that connect the bones in your ankle. This condition is caused by rolling or twisting the ankle. Symptoms include pain, swelling, bruising, and trouble walking. To help with pain and swelling, put ice on the injured ankle, raise your ankle above the level of your heart, and use an elastic bandage. Also, rest as told by your doctor. Keep all follow-up visits as told by your doctor. This is important. This information is not intended to replace advice given to you by your health care  provider. Make sure you discuss any questions you have with your health care provider. Document Revised: 04/18/2018 Document Reviewed: 04/18/2018 Elsevier Patient Education  [employer].  Ankle Sprain, Phase I Rehab An ankle sprain is an injury to the ligaments of your ankle. Ankle sprains cause stiffness, loss of motion, and loss of strength. Ask your health care provider which exercises are safe for you. Do exercises exactly as told by your health care provider and adjust them as directed. It is normal to feel mild stretching, pulling, tightness, or discomfort as you do these exercises. Stop right away if you feel sudden pain or your pain gets worse. Do not begin these exercises until told by your health care provider. Stretching and range-of-motion exercises These exercises warm up your muscles and joints and improve the movement and flexibility of your lower leg and ankle. These exercises also help to relieve pain and stiffness. Gastroc and soleus stretch  This exercise is also called a calf stretch. It stretches the muscles in the back of the lower leg. These muscles are the gastrocnemius, or gastroc, and the soleus. Sit on the floor with your left / right leg extended. Loop a belt or towel around the ball of your left / right foot. The ball of your foot is on the walking surface, right under your toes. Keep your left / right ankle and foot relaxed and keep your knee straight while you use the belt or towel to pull your foot toward you. You should feel a gentle stretch behind your calf or knee in your gastroc muscle. Hold this position for 10 seconds, then release to the starting position. Repeat the exercise with your knee bent. You can put a pillow or a rolled bath towel under your knee to support it. You should feel a stretch deep in your calf in the soleus muscle or at your Achilles tendon. Repeat 10 times. Complete this exercise 2 times a day. Ankle alphabet   Sit with your left  / right leg supported at the lower leg. Do not rest your foot on anything. Make sure your foot has room to move freely. Think of your left / right foot as a paintbrush. Move your foot to trace each letter of the alphabet in the air. Keep your hip and knee still while you trace. Make the letters as large as you can without feeling discomfort. Trace every letter from A to Z. Repeat 10 times. Complete this exercise 2 times a day. Strengthening exercises These exercises build strength and endurance in your ankle and lower leg. Endurance is the ability to use your muscles for a long time, even after they get tired. Ankle dorsiflexion   Secure a rubber exercise band or tube to an object, such as a table leg, that will stay still when the band is pulled. Secure the other end around your left / right foot. Sit on the floor facing the object, with your left /  right leg extended. The band or tube should be slightly tense when your foot is relaxed. Slowly bring your foot toward you, bringing the top of your foot toward your shin (dorsiflexion), and pulling the band tighter. Hold this position for 10 seconds. Slowly return your foot to the starting position. Repeat 10 times. Complete this exercise 2 times a day. Ankle plantar flexion   Sit on the floor with your left / right leg extended. Loop a rubber exercise tube or band around the ball of your left / right foot. The ball of your foot is on the walking surface, right under your toes. Hold the ends of the band or tube in your hands. The band or tube should be slightly tense when your foot is relaxed. Slowly point your foot and toes downward to tilt the top of your foot away from your shin (plantar flexion). Hold this position for 10 seconds. Slowly return your foot to the starting position. Repeat 10 times. Complete this exercise 2 times a day. Ankle eversion Sit on the floor with your legs straight out in front of you. Loop a rubber exercise  band or tube around the ball of your left / right foot. The ball of your foot is on the walking surface, right under your toes. Hold the ends of the band in your hands, or secure the band to a stable object. The band or tube should be slightly tense when your foot is relaxed. Slowly push your foot outward, away from your other leg (eversion). Hold this position for 10 seconds. Slowly return your foot to the starting position. Repeat 10 times. Complete this exercise 2 times a day. This information is not intended to replace advice given to you by your health care provider. Make sure you discuss any questions you have with your health care provider. Document Revised: 03/13/2019 Document Reviewed: 09/04/2018 Elsevier Patient Education  2020 [employer].

## 2022-08-30 NOTE — Progress Notes (Signed)
  Subjective:  Patient ID: Jermaine Waters, male    DOB: 08/23/1946,  MRN: [id number]  Chief Complaint  Patient presents with   Nail Problem    Thick painful toenails, 4 month follow up     76 y.o. male presents with the above complaint. History confirmed with patient.  Has not quite noticed much improvement.  He also injured his left ankle about a month ago he was on a hike and twisted and inverted the ankle he had pain since then.  He has a friend that is a physical therapist that recommend immobilization in a cam boot which [place] and has been using this has improved some but still painful and swollen  Objective:  Physical Exam: warm, good capillary refill, no trophic changes or ulcerative lesions, normal DP and PT pulses and normal sensory exam.  Left hallux lateral distal nailbed still dystrophic and not well attached.  He has pain on the ATFL and CFL, some pain in the peroneal tendons with resisted eversion and in the retromalleolar groove   Assessment:   1. Onychomycosis   2. Sprain of left ankle, unspecified ligament, initial encounter   3. Peroneal tendinitis of left lower extremity       Plan:  Patient was evaluated and treated and all questions answered.  We again discussed the nail dystrophy and at this point time think it is possible that this may not improve for him.  I do not think that further laser therapy would be indicated.  This is something he likely will live with we discussed the option of a total nail avulsion but that this may yield mixed results and he will hold off on this for now.  We also discussed the injury that he had and that he likely has a grade 2 ankle sprain as well as peroneal tendon inflammation.  Discussed treatment of this including immobilization physical therapy and anti-inflammatories.  Also discussed use of Voltaren gel.  I recommended formal physical therapy and a referral for this was given to him he will take to his therapist Dr.  Belia Heman.  Home exercises were given as well recommended use of these twice daily.  He will return as needed if it does not improve within 6 to 8 weeks  Return if symptoms worsen or fail to improve.

## 2022-09-02 ENCOUNTER — Ambulatory Visit (INDEPENDENT_AMBULATORY_CARE_PROVIDER_SITE_OTHER): Payer: Medicare Other | Admitting: Psychiatry

## 2022-09-02 ENCOUNTER — Encounter: Payer: Self-pay | Admitting: Psychiatry

## 2022-09-02 VITALS — BP 170/54 | HR 71

## 2022-09-02 DIAGNOSIS — F411 Generalized anxiety disorder: Secondary | ICD-10-CM | POA: Diagnosis not present

## 2022-09-02 DIAGNOSIS — G4730 Sleep apnea, unspecified: Secondary | ICD-10-CM | POA: Diagnosis not present

## 2022-09-02 DIAGNOSIS — F331 Major depressive disorder, recurrent, moderate: Secondary | ICD-10-CM | POA: Diagnosis not present

## 2022-09-02 DIAGNOSIS — G4710 Hypersomnia, unspecified: Secondary | ICD-10-CM | POA: Diagnosis not present

## 2022-09-02 NOTE — Progress Notes (Signed)
Jermaine Waters [id number] 02-17-1946 76 y.o.   Subjective:   Patient ID:  Jermaine Waters is a 76 y.o. (DOB [birth year]) male.  Chief Complaint:  Chief Complaint  Patient presents with   Follow-up   Depression   Anxiety    Depression        Associated symptoms include decreased concentration and fatigue.  Past medical history includes anxiety.   Anxiety Symptoms include decreased concentration. Patient reports no chest pain, nausea or palpitations.     Jermaine Waters presents to the office today for follow-up of mood concerns and hypersomnolence.    Patient was seen August 08, 2019.  The assessment was depression.  The following was done: STOP  bupropion 75 mg. Start bupropion XL 150 mg tablets 1 each morning for 2 weeks. If no side effects such as jitteriness, then increase to 2 tablets each morning which equals 300 mg daily. He continued Ritalin 10 QID.  Patient was seen October 10, 2019.  He had a partial response with Wellbutrin XL 300 mg daily.  He was tolerating it and therefore the dosage was increased to 450 mg a day. He continued Ritalin 10 mg 4 times daily No Covid problems.   Didn't notice much difference with increase Wellbutrin.  Wife thinks he's a little more lethargic.  In some ways a little happier but really struggling with wife.  Pending temporary separation for a few months to try to provide some healing.  Hopefully will help both of them.   Wife concerned about Ritalin Rx for alertness DT OSA and central apnea that even affects him while awake.  PT meds could fall asleep driving.  Ritalin helps and CPAP helps.  Helps alerness and no insomnia.  Wonders about SE of it. A little more optimistic and less tense.  Therapy helpng also. No SE.  Not jitttery nor edgy.  Wife critical that he's less responsive, but maybe I'm less on edge. Energy is fine.  Interests hard to answer bc not a lot of outside activities left DT Covid.  Couple of small groups meeting with Zoom.   Most of free time before was singing but can't do it now. Things changed and wants meds and therapy.  PCP started Wellbutrin a couple of weeks ago. All my life self-sabotage with jobs and has to restart.  Fear of failure.  Some worsening health issues dropping productivity.  Wife frustrated with him and constantly advising and critical.  Marital stress and unhappiness worse bc together all the time. Plan because of residual depression consideration was given to augment with Abilify.  He was in the process of moving and he felt that perhaps after the move he might have a different view and he wanted to defer.  So no meds were changed.  Appointment Apr 07, 2020, the following was noted: He did get moved.  Still having conflict with his wife which is a chronic stress. Pt reports that mood is Anxious, Depressed and Dysphoric and describes depression and anxiety as mild or mild to moderate but overall improved since last visit.. Anxiety symptoms include: Excessive Worry, avoidant of stress or pressure or commitments. . Pt reports no sleep issues and 7 hours but prefers 8 hours. Pt reports that appetite is good. Pt reports that energy is poor and anhedonia, poor motivation and withdrawn from usual activities. Concentration is down slightly. Suicidal thoughts:  denied by patient. Plan:  No med changes  05/21/2020 appointment the following is noted: Urgent appt at his request. One concern  is more anxiety and procrastinates.[place] if he's working on the wrong problem.  [name] of everything.  ? Fear of failure.  Easily stressed by small demands.  Stress averse and not operating at full capacity. No history of anxiety meds.   Counseling. Ritalin 10 QID for hypersomnolence but questions ADD bc is distractible.  Sleep apnea for 20 years on CPAP.  Chronically sleepy. Plan: no med changes:  08/26/2020 appointment with the following noted: Increased Wellbutrin to 450 mg has worked fine. Feeling great.  Only  negative is sleepy if sits down to read. Sleepy all his life and long term CPAP user.  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 No trouble with AK since July 20 and no anxiety while she's gone.   Plan: Doing well.  No med changes  12/31/2020 appointment with following noted: Wife joined per patient request Overall OK but somewhat lifeless and disconnected and wonders if med related or not and if changes can be made. Otherwise not anxious.  Not joyful.   Wife Jermaine Waters has different perspective in some areas.  She thinks he's too inactive.  She says since retirement he lacks drive to do much.  Rare exercise. Plan: Reduce the Lexapro to 5 mg nightly to see if it is having a dulling effect   02/25/2021 appointment with the following noted: No difference noted with less Lexapro.  Overall sort of cloudy and in my own world.  Wife CO his memory issues.  Depressed, frustrated, overwhelmed and probably worse after the reduction in meds. Caring for wife with TKR.  Stressful relationship very. Does go out 3-4 nights per week with various groups and some church activity.  Plan: DC Lexapro and start duloxetine 30 mg for a week then 60 mg daily. Continue Wellbutrin XL 450 mg daily Continue Ritalin 10 mg 4 times daily  06/01/2021 appointment with the following noted: A little more irritable.  Esp with his wife.  Wife complains about him.  He's afraid of saying something that upsets her. Chronic sleepiness is worse he thinks.  Thinks it's med related. Depression is unchanged.   Plan: Wean duloxetine using 20 mg capsules to 40 mg daily for 2 weeks then 20 mg daily for 2 weeks then stop it in order to prevent withdrawal symptoms.  He will pay attention to see if irritability and alertness are improved. Continue Wellbutrin XL 450 mg daily Continue Ritalin 10 mg tablets 4 times daily  08/20/2021 appointment with the following noted: At times doesn't think any meds are doing anything and doesn't remember  meds helping him.  Wonders if he can get off the Wellbutrin Neuropsych testing is not suggesting dementia. Still chronic marital problems no better and hostile relationship.  Talking about separation.  Working to sell the house. Not highly motivated.  Knows he'd feel better separated from wife.  Can find things he enjoys at home.  Thinks he's underachieved in life.  Occ feels hopeless.  Lists of things he needs to do with constant sense of procrastination on things necessary to do. Plan: Wean Wellbutrin and start Trintellix 5 mg daily for 4 to 7 days and if there is no nausea then increase to 10 mg daily.  09/16/21 appt noted: Just increased Trintellix to 10 mg daily 2 days ago.  No SE noted. Off Wellbutrin. Signed contract to sell house by end of month and wife gone off deep-end and not functioning and is at home in bed.  She's having more emotional outbursts and "  definitely hyped".  He thinks she's hyper and talking constantly and more aggressive, slamming doors etc than usual. Overall he feels stronger and happier with change in meds.  He'd like to contrinue it.   Plan continue Trintellix 10 mg daily and Ritalin 10 mg 4 times daily  10/20/2021 appointment with the following noted: Trintellix working feeling stronger, lighter, generally more optimistic.  In complete contrast to wife.  I'm glad I'm on Trintellix. Satisfied with Ritalin.  Missed 4 days and was very tired.  On it 20 years. Overall satisfied with meds. Remains under high stress with wife and at home. No SE. No med changes  01/20/2022 appointment with the following noted: No Still on Trintellix 10 and Ritalin and making life better. No SE. Better mood has been maintained. No problems with sleep.  Using CPAP Satisfied with Ritalin.  Missed 4 days and was very tired.  On it 20 years. Still sings in group.    09/02/22 appt noted: Ongoing marital stress but maybe some progress.  Overall thinks she's still depressed. This causes  him to wrestle too but counseling creates some issues.  Joint sessions with Godfrey Pick are helping.   Continues Trintellix 10 and Ritalin 10 QID. Compliant without a problem.   Continues singing. Normally 6-7 hours of sleep but even if 9 hours falls asleep reading. CPAP working properly.  Consistent with it. Mood about the same as Nov 2022.   No SE.    Past Psychiatric Medication Trials:  History [name].  On and off antidepressants for 20 years  Doesn't remember any being helpful.  lexapro 10  Trintellix 10 Remote Wellbutrin 450.  Doesn't remember the names of prior meds but remembers sexual SE with one. History of Nuvigil for hypersomnolence but he does not recall the response but apparently not good enough so Ritalin was RX in it's place  Review of Systems:  Review of Systems  Constitutional:  Positive for fatigue.  Eyes:  Positive for visual disturbance.  Cardiovascular:  Negative for chest pain and palpitations.  Gastrointestinal:  Negative for nausea.  Musculoskeletal:  Positive for arthralgias.  Neurological:  Negative for tremors and weakness.  Psychiatric/Behavioral:  Positive for decreased concentration.     Medications: I have reviewed the patient's current medications.  Current Outpatient Medications  Medication Sig Dispense Refill   ciclopirox (PENLAC) 8 % solution Apply topically at bedtime. Apply over nail and surrounding skin. Apply daily over previous coat. After seven (7) days, may remove with alcohol and continue cycle. 6.6 mL 2   CONTOUR NEXT TEST test strip      Cyanocobalamin (VITAMIN B 12) 500 MCG TABS Take 2,000 Units by mouth.     magnesium 30 MG tablet Take 30 mg by mouth 2 (two) times daily.     methylphenidate (RITALIN) 10 MG tablet Take 1 tablet (10 mg total) by mouth 4 (four) times daily. 120 tablet 0   NOVOLOG 100 UNIT/ML injection U IN INSULIN PUMP .APPROXIMATELY 80 UNITS A DAY  6   pravastatin (PRAVACHOL) 10 MG tablet 10 mg. Taking  1/2 tab of '20mg'$      PROLENSA 0.07 % SOLN INT 1 GTT IN OS HS  6   ramipril (ALTACE) 5 MG capsule Take 5 mg by mouth 2 (two) times daily.     sildenafil (REVATIO) 20 MG tablet Take 20 mg by mouth as needed (take 2-5 tablets as needed for erections).     terbinafine (LAMISIL) 250 MG tablet TAKE 1 TABLET(250 MG)  BY MOUTH DAILY 90 tablet 0   vortioxetine HBr (TRINTELLIX) 10 MG TABS tablet Take 1 tablet (10 mg total) by mouth daily. 90 tablet 1   No current facility-administered medications for this visit.    Medication Side Effects: None  Allergies: No Known Allergies  Past Medical History:  Diagnosis Date   Anxiety    Arthritis    Cancer ([place])    skin CA removed   Depression    Diabetes mellitus    Diabetes mellitus without complication (HCC)    Type 1   Elevated PSA 11/2014   PCP ordered for patient to take Cipro for 21 days   Erectile dysfunction    Hypercholesteremia    Skin cancer of face    Sleep apnea    wears CPAP nightly    Family History  Problem Relation Age of Onset   Cancer Father        Esophageal/gastric cancer   Cancer Mother        Metastatic cancer   Rheum arthritis Mother    Colon cancer Neg Hx    Pancreatic cancer Neg Hx    Stomach cancer Neg Hx     Social History   Socioeconomic History   Marital status: Married    Spouse name: Not on file   Number of children: 2   Years of education: Not on file   Highest education level: Not on file  Occupational History   Occupation: Retired    Fish farm manager: [employer]  Tobacco Use   Smoking status: Former    Packs/day: 2.00    Years: 15.00    Total pack years: 30.00    Types: Cigarettes    Quit date: 1981    Years since quitting: 42.7   Smokeless tobacco: Never  Substance and Sexual Activity   Alcohol use: No    Comment: 1986   Drug use: No   Sexual activity: Not on file  Other Topics Concern   Not on file  Social History Narrative    Merged History Encounter        Social Determinants of  Health   Financial Resource Strain: Not on file  Food Insecurity: Not on file  Transportation Needs: Not on file  Physical Activity: Not on file  Stress: Not on file  Social Connections: Not on file  Intimate Partner Violence: Not on file    Past Medical History, Surgical history, Social history, and Family history were reviewed and updated as appropriate.   Please see review of systems for further details on the patient's review from today.   Objective:   Physical Exam:  BP (!) 170/54   Pulse 71   Physical Exam Constitutional:      General: He is not in acute distress. Musculoskeletal:        General: No deformity.  Neurological:     Mental Status: He is alert and oriented to person, place, and time.     Cranial Nerves: No dysarthria.     Coordination: Coordination normal.  Psychiatric:        Attention and Perception: Attention and perception normal. He does not perceive auditory or visual hallucinations.        Mood and Affect: Mood is not depressed. Affect is not labile, blunt, angry or inappropriate.        Speech: Speech normal.        Behavior: Behavior normal. Behavior is not slowed. Behavior is cooperative.  Thought Content: Thought content normal. Thought content is not paranoid or delusional. Thought content does not include homicidal or suicidal ideation. Thought content does not include suicidal plan.        Cognition and Memory: Cognition and memory normal.        Judgment: Judgment normal.     Comments: Insight intact Much less depressed and anxious with Trintellix     Lab Review:     Component Value Date/Time   NA 139 12/02/2014 0842   K 4.3 12/02/2014 0842   CL 106 12/02/2014 0842   CO2 25 12/02/2014 0842   GLUCOSE 223 (H) 12/02/2014 0842   BUN 24 (H) 12/02/2014 0842   CREATININE 0.87 12/02/2014 0842   CALCIUM 8.8 12/02/2014 0842   GFRNONAA 88 (L) 12/02/2014 0842   GFRAA >90 12/02/2014 0842       Component Value Date/Time   WBC 4.9  12/02/2014 0843   RBC 4.90 12/02/2014 0843   HGB 14.8 12/02/2014 0843   HCT 44.1 12/02/2014 0843   PLT 226 12/02/2014 0843   MCV 90.0 12/02/2014 0843   MCH 30.2 12/02/2014 0843   MCHC 33.6 12/02/2014 0843   RDW 13.1 12/02/2014 0843    No results found for: "POCLITH", "LITHIUM"   No results found for: "PHENYTOIN", "PHENOBARB", "VALPROATE", "CBMZ"   .res Assessment: Plan:    Stafford was seen today for follow-up, depression and anxiety.  Diagnoses and all orders for this visit:  Major depressive disorder, recurrent episode, moderate (HCC)  Generalized anxiety disorder  Sleep apnea with hypersomnolence     Greater than 50% of 30 min  with patient was spent on counseling and coordination of care. We discussed When first seen he denied significant depressive symptoms although his wife thought he was depressed.    He realizes he also needs therapy for this tendency to self sabotage and also for marital conflict that has resulted.  Ongoing conflict with his wife. Was much less depressed and stressed when wife was away .  Continue activity in retirement.  Disc examples like classes, [facility].  Disc poss separation to diminish hostility in the relationship, which in turn might help his depression bc she is highly critical of him.  Disc concerns about wife who is a pt.  She's moving to fast and angry.    Trintellix 10 mg daily.  No change indicated with good response and no SE.  Hypersomnolence predates antidepressants and failed to respond to armodafinil but is improved though not resolved with Ritalin 10 mg 4 times daily Would like to try Concerta but probably can't get it approved with insurance Samples Sunosi Start one half of 75 mg tablet Sunosi in the morning for 3 days, Then increase Sunosi to 175 mg tablet in the morning for 1 week, Then if needed increase Sunosi to 1-1/2 of the 75 mg tablets in the morning for 1 week, Then if needed increase Sunosi to the 150 mg  tablet If it is helpful reduce Ritalin to 1/2 tablet 4 times daily for 2 week s, Then stop Ritalin  Sober for years but decided to restart AA and finish what he's started and he thinks it's fantastic. Still enjoys that.   After Visit Summary for patient specific instructions  FU 2 months  Lynder Parents, MD, DFAPA   Future Appointments  Date Time Provider Force  10/05/2022  9:00 AM Cottle, Billey Co., MD CP-CP None  10/15/2022  7:30 AM Hayden Pedro, MD TRE[facility]TRE None  10/21/2022 10:45 AM McCue, Janett Billow, NP [facility]NA[facility] None    No orders of the defined types were placed in this encounter.    -------------------------------

## 2022-09-02 NOTE — Patient Instructions (Signed)
Start one half of 75 mg tablet Sunosi in the morning for 3 days, Then increase Sunosi to 175 mg tablet in the morning for 1 week, Then if needed increase Sunosi to 1-1/2 of the 75 mg tablets in the morning for 1 week, Then if needed increase Sunosi to the 150 mg tablet If it is helpful reduce Ritalin to 1/2 tablet 4 times daily for 2 week s, Then stop Ritalin

## 2022-09-10 ENCOUNTER — Other Ambulatory Visit: Payer: Federal, State, Local not specified - PPO

## 2022-09-16 DIAGNOSIS — E1040 Type 1 diabetes mellitus with diabetic neuropathy, unspecified: Secondary | ICD-10-CM | POA: Diagnosis not present

## 2022-09-16 DIAGNOSIS — R7989 Other specified abnormal findings of blood chemistry: Secondary | ICD-10-CM | POA: Diagnosis not present

## 2022-09-16 DIAGNOSIS — E785 Hyperlipidemia, unspecified: Secondary | ICD-10-CM | POA: Diagnosis not present

## 2022-09-16 DIAGNOSIS — Z125 Encounter for screening for malignant neoplasm of prostate: Secondary | ICD-10-CM | POA: Diagnosis not present

## 2022-09-16 DIAGNOSIS — I10 Essential (primary) hypertension: Secondary | ICD-10-CM | POA: Diagnosis not present

## 2022-09-17 ENCOUNTER — Other Ambulatory Visit: Payer: Self-pay | Admitting: Psychiatry

## 2022-09-20 DIAGNOSIS — Z1212 Encounter for screening for malignant neoplasm of rectum: Secondary | ICD-10-CM | POA: Diagnosis not present

## 2022-09-21 DIAGNOSIS — Z794 Long term (current) use of insulin: Secondary | ICD-10-CM | POA: Diagnosis not present

## 2022-09-21 DIAGNOSIS — F329 Major depressive disorder, single episode, unspecified: Secondary | ICD-10-CM | POA: Diagnosis not present

## 2022-09-21 DIAGNOSIS — Z1339 Encounter for screening examination for other mental health and behavioral disorders: Secondary | ICD-10-CM | POA: Diagnosis not present

## 2022-09-21 DIAGNOSIS — R82998 Other abnormal findings in urine: Secondary | ICD-10-CM | POA: Diagnosis not present

## 2022-09-21 DIAGNOSIS — I739 Peripheral vascular disease, unspecified: Secondary | ICD-10-CM | POA: Diagnosis not present

## 2022-09-21 DIAGNOSIS — Z1331 Encounter for screening for depression: Secondary | ICD-10-CM | POA: Diagnosis not present

## 2022-09-21 DIAGNOSIS — E1040 Type 1 diabetes mellitus with diabetic neuropathy, unspecified: Secondary | ICD-10-CM | POA: Diagnosis not present

## 2022-09-21 DIAGNOSIS — I10 Essential (primary) hypertension: Secondary | ICD-10-CM | POA: Diagnosis not present

## 2022-09-21 DIAGNOSIS — Z0000 Encounter for general adult medical examination without abnormal findings: Secondary | ICD-10-CM | POA: Diagnosis not present

## 2022-09-21 DIAGNOSIS — E109 Type 1 diabetes mellitus without complications: Secondary | ICD-10-CM | POA: Diagnosis not present

## 2022-09-21 DIAGNOSIS — G4733 Obstructive sleep apnea (adult) (pediatric): Secondary | ICD-10-CM | POA: Diagnosis not present

## 2022-09-21 DIAGNOSIS — Z23 Encounter for immunization: Secondary | ICD-10-CM | POA: Diagnosis not present

## 2022-09-21 DIAGNOSIS — F900 Attention-deficit hyperactivity disorder, predominantly inattentive type: Secondary | ICD-10-CM | POA: Diagnosis not present

## 2022-09-21 DIAGNOSIS — E785 Hyperlipidemia, unspecified: Secondary | ICD-10-CM | POA: Diagnosis not present

## 2022-09-24 ENCOUNTER — Other Ambulatory Visit: Payer: Self-pay | Admitting: Psychiatry

## 2022-09-24 ENCOUNTER — Telehealth: Payer: Self-pay | Admitting: Psychiatry

## 2022-09-24 MED ORDER — SUNOSI 150 MG PO TABS: 150.0000 mg | ORAL_TABLET | Freq: Every morning | ORAL | 1 refills | Status: DC

## 2022-09-24 NOTE — Telephone Encounter (Signed)
Pt requesting rx for sonosi

## 2022-09-24 NOTE — Telephone Encounter (Signed)
Verify the dose.  Is he taking the max dose of 150 mg am or less?

## 2022-09-24 NOTE — Telephone Encounter (Signed)
Pt called at 9:17a.  He said Dr. Clovis Pu gave him samples of Sonosi.  He would like a 90 script sent to [facility].  Next appt 12/19

## 2022-09-24 NOTE — Telephone Encounter (Signed)
He is taking 150 mg once a day

## 2022-09-24 NOTE — Telephone Encounter (Signed)
sent 

## 2022-09-27 ENCOUNTER — Telehealth: Payer: Self-pay

## 2022-09-27 NOTE — Telephone Encounter (Signed)
Prior Authorization submitted and approved for SUNOSI 150 MG effective 08/28/2022-09/27/2023 with BCBS/FEP

## 2022-09-30 ENCOUNTER — Telehealth: Payer: Self-pay | Admitting: Psychiatry

## 2022-09-30 ENCOUNTER — Other Ambulatory Visit: Payer: Self-pay

## 2022-09-30 MED ORDER — SUNOSI 150 MG PO TABS: 150.0000 mg | ORAL_TABLET | Freq: Every morning | ORAL | 1 refills | Status: DC

## 2022-09-30 NOTE — Telephone Encounter (Signed)
Pt called Rx for Sunosi 150 mg was sent to [facility] and he ask for [facility] mail order. 90 day. Please correct

## 2022-09-30 NOTE — Telephone Encounter (Signed)
Pt called reporting Rx was sent to [facility] and he ask for [facility] mail order for 90 day. Please correct.

## 2022-09-30 NOTE — Telephone Encounter (Signed)
Cancelled and pended  

## 2022-10-05 ENCOUNTER — Ambulatory Visit: Payer: Medicare Other | Admitting: Psychiatry

## 2022-10-15 ENCOUNTER — Encounter (INDEPENDENT_AMBULATORY_CARE_PROVIDER_SITE_OTHER): Payer: Federal, State, Local not specified - PPO | Admitting: Ophthalmology

## 2022-10-15 DIAGNOSIS — E113591 Type 2 diabetes mellitus with proliferative diabetic retinopathy without macular edema, right eye: Secondary | ICD-10-CM | POA: Diagnosis not present

## 2022-10-15 DIAGNOSIS — H43813 Vitreous degeneration, bilateral: Secondary | ICD-10-CM | POA: Diagnosis not present

## 2022-10-15 DIAGNOSIS — H34812 Central retinal vein occlusion, left eye, with macular edema: Secondary | ICD-10-CM

## 2022-10-15 DIAGNOSIS — E113512 Type 2 diabetes mellitus with proliferative diabetic retinopathy with macular edema, left eye: Secondary | ICD-10-CM | POA: Diagnosis not present

## 2022-10-20 NOTE — Progress Notes (Unsigned)
[facility] [address] [place]. [place] [address] [phone]       OFFICE FOLLOW UP NOTE  Mr. Clarence Cogswell Date of Birth:  09/01/1946 Medical Record Number:  [id number]   Primary neurologist: Dr. Brett Fairy Reason for visit: Initial CPAP follow-up (new machine)    SUBJECTIVE:   CHIEF COMPLAINT:  No chief complaint on file.   HPI:   Update 10/21/2022 JM: Patient returns for yearly CPAP follow-up visit.  Review of DL report as below shows excellent usage and optimal residual AHI.       Update 10/21/2021 JM: Kahmari Koller is a 76 y.o. with longstanding sleep apnea.  Previously seen by Dr. Brett Fairy 10/07/2020 and completed HST to confirm baseline and obtain new CPAP machine.  Returns today for initial CPAP compliance visit since obtaining new machine which was started on 09/02/2021.  Review of compliance report as below -excellent compliance 100% with residual AHI 2.2.  He has been doing well with new machine - tolerates well - continues to use nasal pillows.  He does report during the day time sitting and reading or just relaxing, he will notice where he will "forget" to breath as if his brain isn't telling his body to breath - he questions if there is anything that needs to be done about that  Epworth sleepiness scale 9/24 (prior 14/24) No other concerns at this time            ROS:   14 system review of systems performed and negative with exception of those listed in HPI  PMH:  Past Medical History:  Diagnosis Date   Anxiety    Arthritis    Cancer ([place])    skin CA removed   Depression    Diabetes mellitus    Diabetes mellitus without complication ([place])    Type 1   Elevated PSA 11/2014   PCP ordered for patient to take Cipro for 21 days   Erectile dysfunction    Hypercholesteremia    Skin cancer of face    Sleep apnea    wears CPAP nightly    PSH:  Past Surgical History:  Procedure Laterality Date   APPENDECTOMY      CHOLECYSTECTOMY     COLONOSCOPY     EYE SURGERY Bilateral    laser   ROTATOR CUFF REPAIR Right    SEPTOPLASTY Bilateral 12/11/2014   Procedure: NASAL BILATERAL SEPTOPLASTY;  Surgeon: Jerrell Belfast, MD;  Location: [facility];  Service: ENT;  Laterality: Bilateral;   SHOULDER ARTHROSCOPY WITH ROTATOR CUFF REPAIR AND SUBACROMIAL DECOMPRESSION Right 04/05/2013   Procedure: RIGHT SHOULDER ARTHROSCOPY SUBACROMIAL DECOMPRESSION DISTAL CLAVICLE RESECTION AND ROTATOR CUFF REPAIR ;  Surgeon: Marin Shutter, MD;  Location: [facility];  Service: Orthopedics;  Laterality: Right;   TONSILLECTOMY     TURBINATE REDUCTION N/A 12/11/2014   Procedure: INFERIOR TURBINATE REDUCTION;  Surgeon: Jerrell Belfast, MD;  Location[facility] OR;  Service: ENT;  Laterality: N/A;    Social History:  Social History   Socioeconomic History   Marital status: Married    Spouse name: Not on file   Number of children: 2   Years of education: Not on file   Highest education level: Not on file  Occupational History   Occupation: Retired    Fish farm manager: [employer]  Tobacco Use   Smoking status: Former    Packs/day: 2.00    Years: 15.00    Total pack years: 30.00    Types: Cigarettes    Quit  date: 59    Years since quitting: 42.8   Smokeless tobacco: Never  Substance and Sexual Activity   Alcohol use: No    Comment: 1986   Drug use: No   Sexual activity: Not on file  Other Topics Concern   Not on file  Social History Narrative    Merged History Encounter        Social Determinants of Health   Financial Resource Strain: Not on file  Food Insecurity: Not on file  Transportation Needs: Not on file  Physical Activity: Not on file  Stress: Not on file  Social Connections: Not on file  Intimate Partner Violence: Not on file    Family History:  Family History  Problem Relation Age of Onset   Cancer Father        Esophageal/gastric cancer   Cancer Mother        Metastatic cancer   Rheum arthritis Mother    Colon  cancer Neg Hx    Pancreatic cancer Neg Hx    Stomach cancer Neg Hx     Medications:   Current Outpatient Medications on File Prior to Visit  Medication Sig Dispense Refill   ciclopirox (PENLAC) 8 % solution Apply topically at bedtime. Apply over nail and surrounding skin. Apply daily over previous coat. After seven (7) days, may remove with alcohol and continue cycle. 6.6 mL 2   CONTOUR NEXT TEST test strip      Cyanocobalamin (VITAMIN B 12) 500 MCG TABS Take 2,000 Units by mouth.     magnesium 30 MG tablet Take 30 mg by mouth 2 (two) times daily.     methylphenidate (RITALIN) 10 MG tablet Take 1 tablet (10 mg total) by mouth 4 (four) times daily. 120 tablet 0   NOVOLOG 100 UNIT/ML injection U IN INSULIN PUMP .APPROXIMATELY 80 UNITS A DAY  6   pravastatin (PRAVACHOL) 10 MG tablet 10 mg. Taking 1/2 tab of '20mg'$      PROLENSA 0.07 % SOLN INT 1 GTT IN OS HS  6   ramipril (ALTACE) 5 MG capsule Take 5 mg by mouth 2 (two) times daily.     sildenafil (REVATIO) 20 MG tablet Take 20 mg by mouth as needed (take 2-5 tablets as needed for erections).     Solriamfetol HCl (SUNOSI) 150 MG TABS Take 150 mg by mouth every morning. 90 tablet 1   terbinafine (LAMISIL) 250 MG tablet TAKE 1 TABLET(250 MG) BY MOUTH DAILY 90 tablet 0   vortioxetine HBr (TRINTELLIX) 10 MG TABS tablet TAKE 1 TABLET DAILY 90 tablet 0   No current facility-administered medications on file prior to visit.    Allergies:  No Known Allergies    OBJECTIVE:  Physical Exam  There were no vitals filed for this visit.  There is no height or weight on file to calculate BMI. No results found.  General: well developed, well nourished, very pleasant elderly Caucasian male, seated, in no evident distress Head: head normocephalic and atraumatic.  Neck circumference 16.25" Neck: supple with no carotid or supraclavicular bruits Cardiovascular: regular rate and rhythm, no murmurs    Neurologic Exam Mental Status: Awake and fully  alert. Oriented to place and time. Recent and remote memory intact. Attention span, concentration and fund of knowledge appropriate. Mood and affect appropriate.  Cranial Nerves: Pupils equal, briskly reactive to light. Extraocular movements full without nystagmus. Visual fields full to confrontation. Hearing intact. Facial sensation intact. Face, tongue, palate moves normally and symmetrically.  Motor: Normal  bulk and tone. Normal strength in all tested extremity muscles Sensory.: intact to touch , pinprick , position and vibratory sensation.  Coordination: Rapid alternating movements normal in all extremities. Finger-to-nose and heel-to-shin performed accurately bilaterally. Gait and Station: Arises from chair without difficulty. Stance is normal. Gait demonstrates normal stride length and balance Reflexes: 1+ and symmetric. Toes downgoing.      HST 11/03/2020 Per Dr. Brett Fairy: IMPRESSION: OSA (obstructive sleep apnea)  Summary & Diagnosis:      This HST confirms the presence of severe OSA - the AHI here was  48/h, the REM AHI was 59/h. The patient spent 32.7 minutes in  hypoxia and all night in supine position.          ASSESSMENT/PLAN: Hasani Diemer is a 76 y.o. year old male    Complex sleep apnea syndrome  -Continue current settings -Continue nightly usage for adequate CPAP management -will discuss concerns of day time apnea while awake with Dr. Brett Fairy - unsure if related to CSA or if further eval is needed from our standpoint -Routinely follow with DME company for any needed supplies or CPAP related concerns   No orders of the defined types were placed in this encounter.      Follow up in 1 year or call earlier if needed   CC:  PCP: Leanna Battles (Inactive)   [facility] provider: Dr. Brett Fairy   I spent 24 minutes of face-to-face and non-face-to-face time with patient.  This included previsit chart review, lab review, study review, order entry, electronic health  record documentation, patient education and discussion regarding longstanding history of sleep apnea, review and discussion of compliance report, educated on importance of continued nightly usage and answered all other questions to patient satisfaction   Frann Rider, [facility]  [facility] [address] [place], [place] [address]  Phone [phone] Fax [phone] Note: This document was prepared with digital dictation and possible smart phrase technology. Any transcriptional errors that result from this process are unintentional.

## 2022-10-21 ENCOUNTER — Encounter: Payer: Self-pay | Admitting: Adult Health

## 2022-10-21 ENCOUNTER — Ambulatory Visit (INDEPENDENT_AMBULATORY_CARE_PROVIDER_SITE_OTHER): Payer: Medicare Other | Admitting: Adult Health

## 2022-10-21 VITALS — BP 126/49 | HR 76 | Ht 65.0 in | Wt 159.0 lb

## 2022-10-21 DIAGNOSIS — G4731 Primary central sleep apnea: Secondary | ICD-10-CM | POA: Diagnosis not present

## 2022-10-27 DIAGNOSIS — M79672 Pain in left foot: Secondary | ICD-10-CM | POA: Diagnosis not present

## 2022-10-27 DIAGNOSIS — M25572 Pain in left ankle and joints of left foot: Secondary | ICD-10-CM | POA: Diagnosis not present

## 2022-11-04 DIAGNOSIS — I10 Essential (primary) hypertension: Secondary | ICD-10-CM | POA: Diagnosis not present

## 2022-11-04 DIAGNOSIS — E785 Hyperlipidemia, unspecified: Secondary | ICD-10-CM | POA: Diagnosis not present

## 2022-11-04 DIAGNOSIS — G720 Drug-induced myopathy: Secondary | ICD-10-CM | POA: Diagnosis not present

## 2022-11-04 DIAGNOSIS — Z794 Long term (current) use of insulin: Secondary | ICD-10-CM | POA: Diagnosis not present

## 2022-11-04 DIAGNOSIS — E1040 Type 1 diabetes mellitus with diabetic neuropathy, unspecified: Secondary | ICD-10-CM | POA: Diagnosis not present

## 2022-11-04 DIAGNOSIS — E10319 Type 1 diabetes mellitus with unspecified diabetic retinopathy without macular edema: Secondary | ICD-10-CM | POA: Diagnosis not present

## 2022-11-04 DIAGNOSIS — Z4681 Encounter for fitting and adjustment of insulin pump: Secondary | ICD-10-CM | POA: Diagnosis not present

## 2022-11-15 DIAGNOSIS — L570 Actinic keratosis: Secondary | ICD-10-CM | POA: Diagnosis not present

## 2022-11-15 DIAGNOSIS — L820 Inflamed seborrheic keratosis: Secondary | ICD-10-CM | POA: Diagnosis not present

## 2022-11-15 DIAGNOSIS — L723 Sebaceous cyst: Secondary | ICD-10-CM | POA: Diagnosis not present

## 2022-11-15 DIAGNOSIS — Z85828 Personal history of other malignant neoplasm of skin: Secondary | ICD-10-CM | POA: Diagnosis not present

## 2022-11-15 DIAGNOSIS — D2272 Melanocytic nevi of left lower limb, including hip: Secondary | ICD-10-CM | POA: Diagnosis not present

## 2022-11-15 DIAGNOSIS — D225 Melanocytic nevi of trunk: Secondary | ICD-10-CM | POA: Diagnosis not present

## 2022-11-15 DIAGNOSIS — D2262 Melanocytic nevi of left upper limb, including shoulder: Secondary | ICD-10-CM | POA: Diagnosis not present

## 2022-11-15 DIAGNOSIS — D2261 Melanocytic nevi of right upper limb, including shoulder: Secondary | ICD-10-CM | POA: Diagnosis not present

## 2022-11-15 DIAGNOSIS — L814 Other melanin hyperpigmentation: Secondary | ICD-10-CM | POA: Diagnosis not present

## 2022-11-15 DIAGNOSIS — L821 Other seborrheic keratosis: Secondary | ICD-10-CM | POA: Diagnosis not present

## 2022-11-15 DIAGNOSIS — D2271 Melanocytic nevi of right lower limb, including hip: Secondary | ICD-10-CM | POA: Diagnosis not present

## 2022-11-23 ENCOUNTER — Ambulatory Visit (INDEPENDENT_AMBULATORY_CARE_PROVIDER_SITE_OTHER): Payer: Medicare Other | Admitting: Psychiatry

## 2022-11-23 ENCOUNTER — Encounter: Payer: Self-pay | Admitting: Psychiatry

## 2022-11-23 VITALS — BP 145/59 | HR 70

## 2022-11-23 DIAGNOSIS — G4730 Sleep apnea, unspecified: Secondary | ICD-10-CM

## 2022-11-23 DIAGNOSIS — F411 Generalized anxiety disorder: Secondary | ICD-10-CM | POA: Diagnosis not present

## 2022-11-23 DIAGNOSIS — G4733 Obstructive sleep apnea (adult) (pediatric): Secondary | ICD-10-CM | POA: Diagnosis not present

## 2022-11-23 DIAGNOSIS — G4710 Hypersomnia, unspecified: Secondary | ICD-10-CM

## 2022-11-23 DIAGNOSIS — F341 Dysthymic disorder: Secondary | ICD-10-CM | POA: Diagnosis not present

## 2022-11-23 DIAGNOSIS — F331 Major depressive disorder, recurrent, moderate: Secondary | ICD-10-CM | POA: Diagnosis not present

## 2022-11-23 MED ORDER — VORTIOXETINE HBR 10 MG PO TABS: 10.0000 mg | ORAL_TABLET | Freq: Every day | ORAL | 0 refills | Status: DC

## 2022-11-23 NOTE — Progress Notes (Signed)
Jermaine Waters [id number] [birth year] 76 y.o.   Subjective:   Patient ID:  Jermaine Waters is a 76 y.o. (DOB [birth year]) male.  Chief Complaint:  Chief Complaint  Patient presents with   Follow-up   Depression   Anxiety   Fatigue    Depression        Associated symptoms include decreased concentration and fatigue.  Past medical history includes anxiety.   Anxiety Symptoms include decreased concentration and nervous/anxious behavior. Patient reports no chest pain, nausea or palpitations.     Jermaine Waters presents to the office today for follow-up of mood concerns and hypersomnolence.    Patient was seen August 08, 2019.  The assessment was depression.  The following was done: STOP  bupropion 75 mg. Start bupropion XL 150 mg tablets 1 each morning for 2 weeks. If no side effects such as jitteriness, then increase to 2 tablets each morning which equals 300 mg daily. He continued Ritalin 10 QID.  Patient was seen October 10, 2019.  He had a partial response with Wellbutrin XL 300 mg daily.  He was tolerating it and therefore the dosage was increased to 450 mg a day. He continued Ritalin 10 mg 4 times daily No Covid problems.   Didn't notice much difference with increase Wellbutrin.  Wife thinks he's a little more lethargic.  In some ways a little happier but really struggling with wife.  Pending temporary separation for a few months to try to provide some healing.  Hopefully will help both of them.   Wife concerned about Ritalin Rx for alertness DT OSA and central apnea that even affects him while awake.  PT meds could fall asleep driving.  Ritalin helps and CPAP helps.  Helps alerness and no insomnia.  Wonders about SE of it. A little more optimistic and less tense.  Therapy helpng also. No SE.  Not jitttery nor edgy.  Wife critical that he's less responsive, but maybe I'm less on edge. Energy is fine.  Interests hard to answer bc not a lot of outside activities left DT Covid.   Couple of small groups meeting with Zoom.  Most of free time before was singing but can't do it now. Things changed and wants meds and therapy.  PCP started Wellbutrin a couple of weeks ago. All my life self-sabotage with jobs and has to restart.  Fear of failure.  Some worsening health issues dropping productivity.  Wife frustrated with him and constantly advising and critical.  Marital stress and unhappiness worse bc together all the time. Plan because of residual depression consideration was given to augment with Abilify.  He was in the process of moving and he felt that perhaps after the move he might have a different view and he wanted to defer.  So no meds were changed.  Appointment Apr 07, 2020, the following was noted: He did get moved.  Still having conflict with his wife which is a chronic stress. Pt reports that mood is Anxious, Depressed and Dysphoric and describes depression and anxiety as mild or mild to moderate but overall improved since last visit.. Anxiety symptoms include: Excessive Worry, avoidant of stress or pressure or commitments. . Pt reports no sleep issues and 7 hours but prefers 8 hours. Pt reports that appetite is good. Pt reports that energy is poor and anhedonia, poor motivation and withdrawn from usual activities. Concentration is down slightly. Suicidal thoughts:  denied by patient. Plan:  No med changes  05/21/2020 appointment the following is noted: Urgent  appt at his request. One concern is more anxiety and procrastinates.Jermaine Waters if he's working on the wrong problem.  Jermaine Waters of everything.  ? Fear of failure.  Easily stressed by small demands.  Stress averse and not operating at full capacity. No history of anxiety meds.   Counseling. Ritalin 10 QID for hypersomnolence but questions ADD bc is distractible.  Sleep apnea for 20 years on CPAP.  Chronically sleepy. Plan: no med changes:  08/26/2020 appointment with the following noted: Increased Wellbutrin to 450 mg  has worked fine. Feeling great.  Only negative is sleepy if sits down to read. Sleepy all his life and long term CPAP user.  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 No trouble with AK since July 20 and no anxiety while she's gone.   Plan: Doing well.  No med changes  12/31/2020 appointment with following noted: Wife joined per patient request Overall OK but somewhat lifeless and disconnected and wonders if med related or not and if changes can be made. Otherwise not anxious.  Not joyful.   Wife Jermaine Waters has different perspective in some areas.  She thinks he's too inactive.  She says since retirement he lacks drive to do much.  Rare exercise. Plan: Reduce the Lexapro to 5 mg nightly to see if it is having a dulling effect   02/25/2021 appointment with the following noted: No difference noted with less Lexapro.  Overall sort of cloudy and in my own world.  Wife CO his memory issues.  Depressed, frustrated, overwhelmed and probably worse after the reduction in meds. Caring for wife with TKR.  Stressful relationship very. Does go out 3-4 nights per week with various groups and some church activity.  Plan: DC Lexapro and start duloxetine 30 mg for a week then 60 mg daily. Continue Wellbutrin XL 450 mg daily Continue Ritalin 10 mg 4 times daily  06/01/2021 appointment with the following noted: A little more irritable.  Esp with his wife.  Wife complains about him.  He's afraid of saying something that upsets her. Chronic sleepiness is worse he thinks.  Thinks it's med related. Depression is unchanged.   Plan: Wean duloxetine using 20 mg capsules to 40 mg daily for 2 weeks then 20 mg daily for 2 weeks then stop it in order to prevent withdrawal symptoms.  He will pay attention to see if irritability and alertness are improved. Continue Wellbutrin XL 450 mg daily Continue Ritalin 10 mg tablets 4 times daily  08/20/2021 appointment with the following noted: At times doesn't think any meds  are doing anything and doesn't remember meds helping him.  Wonders if he can get off the Wellbutrin Neuropsych testing is not suggesting dementia. Still chronic marital problems no better and hostile relationship.  Talking about separation.  Working to sell the house. Not highly motivated.  Knows he'd feel better separated from wife.  Can find things he enjoys at home.  Thinks he's underachieved in life.  Occ feels hopeless.  Lists of things he needs to do with constant sense of procrastination on things necessary to do. Plan: Wean Wellbutrin and start Trintellix 5 mg daily for 4 to 7 days and if there is no nausea then increase to 10 mg daily.  09/16/21 appt noted: Just increased Trintellix to 10 mg daily 2 days ago.  No SE noted. Off Wellbutrin. Signed contract to sell house by end of month and wife gone off deep-end and not functioning and is at home in bed.  She's having more emotional outbursts and "definitely hyped".  He thinks she's hyper and talking constantly and more aggressive, slamming doors etc than usual. Overall he feels stronger and happier with change in meds.  He'd like to contrinue it.   Plan continue Trintellix 10 mg daily and Ritalin 10 mg 4 times daily  10/20/2021 appointment with the following noted: Trintellix working feeling stronger, lighter, generally more optimistic.  In complete contrast to wife.  I'm glad I'm on Trintellix. Satisfied with Ritalin.  Missed 4 days and was very tired.  On it 20 years. Overall satisfied with meds. Remains under high stress with wife and at home. No SE. No med changes  01/20/2022 appointment with the following noted: No Still on Trintellix 10 and Ritalin and making life better. No SE. Better mood has been maintained. No problems with sleep.  Using CPAP Satisfied with Ritalin.  Missed 4 days and was very tired.  On it 20 years. Still sings in group.    09/02/22 appt noted: Ongoing marital stress but maybe some progress.  Overall  thinks she's still depressed. This causes him to wrestle too but counseling creates some issues.  Joint sessions with Godfrey Pick are helping.   Continues Trintellix 10 and Ritalin 10 QID. Compliant without a problem.   Continues singing. Normally 6-7 hours of sleep but even if 9 hours falls asleep reading. CPAP working properly.  Consistent with it. Mood about the same as Nov 2022.   No SE.   Plan: cont Trintellix 10 Start one half of 75 mg tablet Sunosi in the morning for 3 days, Then increase Sunosi to 175 mg tablet in the morning for 1 week, Then if needed increase Sunosi to 1-1/2 of the 75 mg tablets in the morning for 1 week, Then if needed increase Sunosi to the 150 mg tablet If it is helpful reduce Ritalin to 1/2 tablet 4 times daily for 2 week s, Then stop Ritalin  11/23/22 appt noted: Sunosi helpful and is more awake and it really works. More motivated to keep moving.  Gotten into habit or trying to go to bed reasonable hour unless gets stuck on computer on phone.  Can find endless distractions.  Situation is amplifying that. No SE. Still taking Ritalin. Tendency to be very withdrawn.   Chronic tension with wife and tends to avoid her bc her behavior.   Stilll using CPAP.   Seems to be anxious.  Like my world is collapsing .  Busy beating himself up on what he's not doing and W  jumps on board to be critical.   She is out of control with criticising him over trivial matters.  Criticism is unending.    Past Psychiatric Medication Trials:  History Jermaine Waters.  On and off antidepressants for 20 years  Doesn't remember any being helpful.  lexapro 10  Trintellix 10 Remote Wellbutrin 450.  Doesn't remember the names of prior meds but remembers sexual SE with one. History of Nuvigil for hypersomnolence but he does not recall the response but apparently not good enough so Ritalin was RX in it's place Sunosi 150 benefit  Review of Systems:  Review of Systems   Constitutional:  Positive for fatigue.  Eyes:  Positive for visual disturbance.  Cardiovascular:  Negative for chest pain and palpitations.  Gastrointestinal:  Negative for nausea.  Musculoskeletal:  Positive for arthralgias.  Neurological:  Negative for tremors and weakness.  Psychiatric/Behavioral:  Positive for decreased concentration. The patient is nervous/anxious.  Medications: I have reviewed the patient's current medications.  Current Outpatient Medications  Medication Sig Dispense Refill   ciclopirox (PENLAC) 8 % solution Apply topically at bedtime. Apply over nail and surrounding skin. Apply daily over previous coat. After seven (7) days, may remove with alcohol and continue cycle. 6.6 mL 2   CONTOUR NEXT TEST test strip      Cyanocobalamin (VITAMIN B 12) 500 MCG TABS Take 2,000 Units by mouth.     magnesium 30 MG tablet Take 30 mg by mouth 2 (two) times daily.     methylphenidate (RITALIN) 10 MG tablet Take 1 tablet (10 mg total) by mouth 4 (four) times daily. 120 tablet 0   NOVOLOG 100 UNIT/ML injection U IN INSULIN PUMP .APPROXIMATELY 80 UNITS A DAY  6   pravastatin (PRAVACHOL) 10 MG tablet 10 mg. Taking 1/2 tab of '20mg'$      PROLENSA 0.07 % SOLN INT 1 GTT IN OS HS  6   ramipril (ALTACE) 5 MG capsule Take 5 mg by mouth 2 (two) times daily.     sildenafil (REVATIO) 20 MG tablet Take 20 mg by mouth as needed (take 2-5 tablets as needed for erections).     Solriamfetol HCl (SUNOSI) 150 MG TABS Take 150 mg by mouth every morning. 90 tablet 1   terbinafine (LAMISIL) 250 MG tablet TAKE 1 TABLET(250 MG) BY MOUTH DAILY 90 tablet 0   vortioxetine HBr (TRINTELLIX) 10 MG TABS tablet Take 1 tablet (10 mg total) by mouth daily. 90 tablet 0   No current facility-administered medications for this visit.    Medication Side Effects: None  Allergies: No Known Allergies  Past Medical History:  Diagnosis Date   Anxiety    Arthritis    Cancer ([place])    skin CA removed   Depression     Diabetes mellitus    Diabetes mellitus without complication (HCC)    Type 1   Elevated PSA 11/2014   PCP ordered for patient to take Cipro for 21 days   Erectile dysfunction    Hypercholesteremia    Skin cancer of face    Sleep apnea    wears CPAP nightly    Family History  Problem Relation Age of Onset   Cancer Father        Esophageal/gastric cancer   Cancer Mother        Metastatic cancer   Rheum arthritis Mother    Jermaine cancer Neg Hx    Pancreatic cancer Neg Hx    Stomach cancer Neg Hx     Social History   Socioeconomic History   Marital status: Married    Spouse name: Not on file   Number of children: 2   Years of education: Not on file   Highest education level: Not on file  Occupational History   Occupation: Retired    Fish farm manager: [employer]  Tobacco Use   Smoking status: Former    Packs/day: 2.00    Years: 15.00    Total pack years: 30.00    Types: Cigarettes    Quit date: 1981    Years since quitting: 42.9   Smokeless tobacco: Never  Substance and Sexual Activity   Alcohol use: No    Comment: 1986   Drug use: No   Sexual activity: Not on file  Other Topics Concern   Not on file  Social History Narrative    Merged History Encounter        Social Determinants of Health  Financial Resource Strain: Not on file  Food Insecurity: Not on file  Transportation Needs: Not on file  Physical Activity: Not on file  Stress: Not on file  Social Connections: Not on file  Intimate Partner Violence: Not on file    Past Medical History, Surgical history, Social history, and Family history were reviewed and updated as appropriate.   Please see review of systems for further details on the patient's review from today.   Objective:   Physical Exam:  BP (!) 145/59   Pulse 70   Physical Exam Constitutional:      General: He is not in acute distress. Musculoskeletal:        General: No deformity.  Neurological:     Mental Status: He is alert and  oriented to person, place, and time.     Cranial Nerves: No dysarthria.     Coordination: Coordination normal.  Psychiatric:        Attention and Perception: Attention and perception normal. He does not perceive auditory or visual hallucinations.        Mood and Affect: Mood is anxious. Mood is not depressed. Affect is not labile, blunt, angry or inappropriate.        Speech: Speech normal.        Behavior: Behavior normal. Behavior is not slowed. Behavior is cooperative.        Thought Content: Thought content normal. Thought content is not paranoid or delusional. Thought content does not include homicidal or suicidal ideation. Thought content does not include suicidal plan.        Cognition and Memory: Cognition and memory normal.        Judgment: Judgment normal.     Comments: Insight intact Much less depressed and anxious with Trintellix but still some situationally     Lab Review:     Component Value Date/Time   NA 139 12/02/2014 0842   K 4.3 12/02/2014 0842   CL 106 12/02/2014 0842   CO2 25 12/02/2014 0842   GLUCOSE 223 (H) 12/02/2014 0842   BUN 24 (H) 12/02/2014 0842   CREATININE 0.87 12/02/2014 0842   CALCIUM 8.8 12/02/2014 0842   GFRNONAA 88 (L) 12/02/2014 0842   GFRAA >90 12/02/2014 0842       Component Value Date/Time   WBC 4.9 12/02/2014 0843   RBC 4.90 12/02/2014 0843   HGB 14.8 12/02/2014 0843   HCT 44.1 12/02/2014 0843   PLT 226 12/02/2014 0843   MCV 90.0 12/02/2014 0843   MCH 30.2 12/02/2014 0843   MCHC 33.6 12/02/2014 0843   RDW 13.1 12/02/2014 0843    No results found for: "POCLITH", "LITHIUM"   No results found for: "PHENYTOIN", "PHENOBARB", "VALPROATE", "CBMZ"   .res Assessment: Plan:    Erin was seen today for follow-up, depression, anxiety and fatigue.  Diagnoses and all orders for this visit:  Major depressive disorder, recurrent episode, moderate (HCC) -     vortioxetine HBr (TRINTELLIX) 10 MG TABS tablet; Take 1 tablet (10 mg total)  by mouth daily.  Generalized anxiety disorder -     vortioxetine HBr (TRINTELLIX) 10 MG TABS tablet; Take 1 tablet (10 mg total) by mouth daily.  Sleep apnea with hypersomnolence  Early onset dysthymia -     vortioxetine HBr (TRINTELLIX) 10 MG TABS tablet; Take 1 tablet (10 mg total) by mouth daily.  Obstructive sleep apnea    Greater than 50% of 30 min  with patient was spent on counseling and coordination of care.  We discussed When first seen he denied significant depressive symptoms although his wife thought he was depressed.    He realizes he also needs therapy for this tendency to self sabotage and also for marital conflict that has resulted.  Ongoing conflict with his wife. Was much less depressed and stressed when wife was away .  Encourage activity in retirement.  Disc examples like classes, [facility].  Disc poss separation to diminish hostility in the relationship, which in turn might help his depression bc she is highly critical of him.  Disc concerns about wife who is a pt.  She's moving to fast and angry.    Trintellix 10 mg daily.  Generally good response and no SE. Disc option of increasing the dose to see if more motivation and activity. Disc SE  Hypersomnolence predates antidepressants and failed to respond to armodafinil but is improved though not resolved with Ritalin 10 mg 4 times daily Conintue bc benefit Sunosi to the 150 mg tablet Rec trial reduce Ritalin to 1/2 tablet 4 times daily for 2 week s, Then stop Ritalin.  He's getting from PCP [name].  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 Still enjoys that.  Has been in counseling.     After Visit Summary for patient specific instructions  FU 2 months  Lynder Parents, MD, DFAPA   Future Appointments  Date Time Provider [place]  12/17/2022  7:40 AM Hayden Pedro, MD [facility] None  01/25/2023  2:30 PM Cottle, Billey Co., MD  CP-CP None  02/21/2023  1:30 PM Cottle, Billey Co., MD CP-CP None  03/24/2023  9:00 AM Cottle, Billey Co., MD CP-CP None  10/24/2023  8:45 AM Frann Rider, NP [facility]GNA None    No orders of the defined types were placed in this encounter.    -------------------------------

## 2022-12-15 DIAGNOSIS — F331 Major depressive disorder, recurrent, moderate: Secondary | ICD-10-CM | POA: Diagnosis not present

## 2022-12-17 ENCOUNTER — Encounter (INDEPENDENT_AMBULATORY_CARE_PROVIDER_SITE_OTHER): Payer: Federal, State, Local not specified - PPO | Admitting: Ophthalmology

## 2022-12-20 ENCOUNTER — Other Ambulatory Visit: Payer: Self-pay | Admitting: Podiatry

## 2022-12-21 ENCOUNTER — Encounter (INDEPENDENT_AMBULATORY_CARE_PROVIDER_SITE_OTHER): Payer: Federal, State, Local not specified - PPO | Admitting: Ophthalmology

## 2022-12-21 DIAGNOSIS — E113512 Type 2 diabetes mellitus with proliferative diabetic retinopathy with macular edema, left eye: Secondary | ICD-10-CM

## 2022-12-21 DIAGNOSIS — H34812 Central retinal vein occlusion, left eye, with macular edema: Secondary | ICD-10-CM

## 2022-12-21 DIAGNOSIS — E113591 Type 2 diabetes mellitus with proliferative diabetic retinopathy without macular edema, right eye: Secondary | ICD-10-CM | POA: Diagnosis not present

## 2022-12-21 DIAGNOSIS — H43813 Vitreous degeneration, bilateral: Secondary | ICD-10-CM

## 2022-12-21 DIAGNOSIS — I10 Essential (primary) hypertension: Secondary | ICD-10-CM

## 2022-12-21 DIAGNOSIS — H35033 Hypertensive retinopathy, bilateral: Secondary | ICD-10-CM | POA: Diagnosis not present

## 2022-12-28 DIAGNOSIS — F331 Major depressive disorder, recurrent, moderate: Secondary | ICD-10-CM | POA: Diagnosis not present

## 2023-01-05 DIAGNOSIS — F331 Major depressive disorder, recurrent, moderate: Secondary | ICD-10-CM | POA: Diagnosis not present

## 2023-01-13 DIAGNOSIS — F331 Major depressive disorder, recurrent, moderate: Secondary | ICD-10-CM | POA: Diagnosis not present

## 2023-01-20 DIAGNOSIS — Z4681 Encounter for fitting and adjustment of insulin pump: Secondary | ICD-10-CM | POA: Diagnosis not present

## 2023-01-20 DIAGNOSIS — I10 Essential (primary) hypertension: Secondary | ICD-10-CM | POA: Diagnosis not present

## 2023-01-20 DIAGNOSIS — Z794 Long term (current) use of insulin: Secondary | ICD-10-CM | POA: Diagnosis not present

## 2023-01-20 DIAGNOSIS — E785 Hyperlipidemia, unspecified: Secondary | ICD-10-CM | POA: Diagnosis not present

## 2023-01-20 DIAGNOSIS — E1040 Type 1 diabetes mellitus with diabetic neuropathy, unspecified: Secondary | ICD-10-CM | POA: Diagnosis not present

## 2023-01-24 ENCOUNTER — Encounter (INDEPENDENT_AMBULATORY_CARE_PROVIDER_SITE_OTHER): Payer: Federal, State, Local not specified - PPO | Admitting: Ophthalmology

## 2023-01-25 ENCOUNTER — Ambulatory Visit: Payer: Federal, State, Local not specified - PPO | Admitting: Psychiatry

## 2023-02-21 ENCOUNTER — Ambulatory Visit (INDEPENDENT_AMBULATORY_CARE_PROVIDER_SITE_OTHER): Payer: Medicare Other | Admitting: Psychiatry

## 2023-02-21 ENCOUNTER — Encounter: Payer: Self-pay | Admitting: Psychiatry

## 2023-02-21 DIAGNOSIS — F411 Generalized anxiety disorder: Secondary | ICD-10-CM

## 2023-02-21 DIAGNOSIS — F331 Major depressive disorder, recurrent, moderate: Secondary | ICD-10-CM

## 2023-02-21 DIAGNOSIS — G4730 Sleep apnea, unspecified: Secondary | ICD-10-CM | POA: Diagnosis not present

## 2023-02-21 DIAGNOSIS — G4710 Hypersomnia, unspecified: Secondary | ICD-10-CM | POA: Diagnosis not present

## 2023-02-21 DIAGNOSIS — Z630 Problems in relationship with spouse or partner: Secondary | ICD-10-CM

## 2023-02-21 DIAGNOSIS — G4733 Obstructive sleep apnea (adult) (pediatric): Secondary | ICD-10-CM | POA: Diagnosis not present

## 2023-02-21 DIAGNOSIS — F341 Dysthymic disorder: Secondary | ICD-10-CM

## 2023-02-21 MED ORDER — TADALAFIL 20 MG PO TABS: 20.0000 mg | ORAL_TABLET | Freq: Every day | ORAL | 1 refills | Status: DC | PRN

## 2023-02-21 MED ORDER — SUNOSI 150 MG PO TABS: 150.0000 mg | ORAL_TABLET | Freq: Every morning | ORAL | 1 refills | Status: DC

## 2023-02-21 NOTE — Progress Notes (Signed)
Jermaine Waters [id number] Jul 09, 1946 77 y.o.   Subjective:   Patient ID:  Jermaine Waters is a 77 y.o. (DOB [birth year]) male.  Chief Complaint:  Chief Complaint  Patient presents with   Follow-up    Depression        Associated symptoms include decreased concentration and fatigue.  Past medical history includes anxiety.   Anxiety Symptoms include decreased concentration. Patient reports no chest pain, nausea, nervous/anxious behavior or palpitations.     Jermaine Waters presents to the office today for follow-up of mood concerns and hypersomnolence.    Patient was seen August 08, 2019.  The assessment was depression.  The following was done: STOP  bupropion 75 mg. Start bupropion XL 150 mg tablets 1 each morning for 2 weeks. If no side effects such as jitteriness, then increase to 2 tablets each morning which equals 300 mg daily. He continued Ritalin 10 QID.  Patient was seen October 10, 2019.  He had a partial response with Wellbutrin XL 300 mg daily.  He was tolerating it and therefore the dosage was increased to 450 mg a day. He continued Ritalin 10 mg 4 times daily No Covid problems.   Didn't notice much difference with increase Wellbutrin.  Wife thinks he's a little more lethargic.  In some ways a little happier but really struggling with wife.  Pending temporary separation for a few months to try to provide some healing.  Hopefully will help both of them.   Wife concerned about Ritalin Rx for alertness DT OSA and central apnea that even affects him while awake.  PT meds could fall asleep driving.  Ritalin helps and CPAP helps.  Helps alerness and no insomnia.  Wonders about SE of it. A little more optimistic and less tense.  Therapy helpng also. No SE.  Not jitttery nor edgy.  Wife critical that he's less responsive, but maybe I'm less on edge. Energy is fine.  Interests hard to answer bc not a lot of outside activities left DT Covid.  Couple of small groups meeting with Zoom.   Most of free time before was singing but can't do it now. Things changed and wants meds and therapy.  PCP started Wellbutrin a couple of weeks ago. All my life self-sabotage with jobs and has to restart.  Fear of failure.  Some worsening health issues dropping productivity.  Wife frustrated with him and constantly advising and critical.  Marital stress and unhappiness worse bc together all the time. Plan because of residual depression consideration was given to augment with Abilify.  He was in the process of moving and he felt that perhaps after the move he might have a different view and he wanted to defer.  So no meds were changed.  Appointment Apr 07, 2020, the following was noted: He did get moved.  Still having conflict with his wife which is a chronic stress. Pt reports that mood is Anxious, Depressed and Dysphoric and describes depression and anxiety as mild or mild to moderate but overall improved since last visit.. Anxiety symptoms include: Excessive Worry, avoidant of stress or pressure or commitments. . Pt reports no sleep issues and 7 hours but prefers 8 hours. Pt reports that appetite is good. Pt reports that energy is poor and anhedonia, poor motivation and withdrawn from usual activities. Concentration is down slightly. Suicidal thoughts:  denied by patient. Plan:  No med changes  05/21/2020 appointment the following is noted: Urgent appt at his request. One concern is more anxiety and  procrastinates.Jermaine Waters if he's working on the wrong problem.  Jermaine Waters of everything.  ? Fear of failure.  Easily stressed by small demands.  Stress averse and not operating at full capacity. No history of anxiety meds.   Counseling. Ritalin 10 QID for hypersomnolence but questions ADD bc is distractible.  Sleep apnea for 20 years on CPAP.  Chronically sleepy. Plan: no med changes:  08/26/2020 appointment with the following noted: Increased Wellbutrin to 450 mg has worked fine. Feeling great.  Only  negative is sleepy if sits down to read. Sleepy all his life and long term CPAP user.  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 No trouble with AK since July 20 and no anxiety while she's gone.   Plan: Doing well.  No med changes  12/31/2020 appointment with following noted: Wife joined per patient request Overall OK but somewhat lifeless and disconnected and wonders if med related or not and if changes can be made. Otherwise not anxious.  Not joyful.   Wife Jermaine Waters has different perspective in some areas.  She thinks he's too inactive.  She says since retirement he lacks drive to do much.  Rare exercise. Plan: Reduce the Lexapro to 5 mg nightly to see if it is having a dulling effect   02/25/2021 appointment with the following noted: No difference noted with less Lexapro.  Overall sort of cloudy and in my own world.  Wife CO his memory issues.  Depressed, frustrated, overwhelmed and probably worse after the reduction in meds. Caring for wife with TKR.  Stressful relationship very. Does go out 3-4 nights per week with various groups and some church activity.  Plan: DC Lexapro and start duloxetine 30 mg for a week then 60 mg daily. Continue Wellbutrin XL 450 mg daily Continue Ritalin 10 mg 4 times daily  06/01/2021 appointment with the following noted: A little more irritable.  Esp with his wife.  Wife complains about him.  He's afraid of saying something that upsets her. Chronic sleepiness is worse he thinks.  Thinks it's med related. Depression is unchanged.   Plan: Wean duloxetine using 20 mg capsules to 40 mg daily for 2 weeks then 20 mg daily for 2 weeks then stop it in order to prevent withdrawal symptoms.  He will pay attention to see if irritability and alertness are improved. Continue Wellbutrin XL 450 mg daily Continue Ritalin 10 mg tablets 4 times daily  08/20/2021 appointment with the following noted: At times doesn't think any meds are doing anything and doesn't remember  meds helping him.  Wonders if he can get off the Wellbutrin Neuropsych testing is not suggesting dementia. Still chronic marital problems no better and hostile relationship.  Talking about separation.  Working to sell the house. Not highly motivated.  Knows he'd feel better separated from wife.  Can find things he enjoys at home.  Thinks he's underachieved in life.  Occ feels hopeless.  Lists of things he needs to do with constant sense of procrastination on things necessary to do. Plan: Wean Wellbutrin and start Trintellix 5 mg daily for 4 to 7 days and if there is no nausea then increase to 10 mg daily.  09/16/21 appt noted: Just increased Trintellix to 10 mg daily 2 days ago.  No SE noted. Off Wellbutrin. Signed contract to sell house by end of month and wife gone off deep-end and not functioning and is at home in bed.  She's having more emotional outbursts and "definitely hyped".  He  thinks she's hyper and talking constantly and more aggressive, slamming doors etc than usual. Overall he feels stronger and happier with change in meds.  He'd like to contrinue it.   Plan continue Trintellix 10 mg daily and Ritalin 10 mg 4 times daily  10/20/2021 appointment with the following noted: Trintellix working feeling stronger, lighter, generally more optimistic.  In complete contrast to wife.  I'm glad I'm on Trintellix. Satisfied with Ritalin.  Missed 4 days and was very tired.  On it 20 years. Overall satisfied with meds. Remains under high stress with wife and at home. No SE. No med changes  01/20/2022 appointment with the following noted: No Still on Trintellix 10 and Ritalin and making life better. No SE. Better mood has been maintained. No problems with sleep.  Using CPAP Satisfied with Ritalin.  Missed 4 days and was very tired.  On it 20 years. Still sings in group.    09/02/22 appt noted: Ongoing marital stress but maybe some progress.  Overall thinks she's still depressed. This causes  him to wrestle too but counseling creates some issues.  Joint sessions with Godfrey Pick are helping.   Continues Trintellix 10 and Ritalin 10 QID. Compliant without a problem.   Continues singing. Normally 6-7 hours of sleep but even if 9 hours falls asleep reading. CPAP working properly.  Consistent with it. Mood about the same as Nov 2022.   No SE.   Plan: cont Trintellix 10 Start one half of 75 mg tablet Sunosi in the morning for 3 days, Then increase Sunosi to 175 mg tablet in the morning for 1 week, Then if needed increase Sunosi to 1-1/2 of the 75 mg tablets in the morning for 1 week, Then if needed increase Sunosi to the 150 mg tablet If it is helpful reduce Ritalin to 1/2 tablet 4 times daily for 2 week s, Then stop Ritalin  11/23/22 appt noted: Sunosi helpful and is more awake and it really works. More motivated to keep moving.  Gotten into habit or trying to go to bed reasonable hour unless gets stuck on computer on phone.  Can find endless distractions.  Situation is amplifying that. No SE. Still taking Ritalin. Tendency to be very withdrawn.   Chronic tension with wife and tends to avoid her bc her behavior.   Stilll using CPAP.   Seems to be anxious.  Like my world is collapsing .  Busy beating himself up on what he's not doing and W  jumps on board to be critical.   She is out of control with criticising him over trivial matters.  Criticism is unending.  Plan: continue Trintellix 10  Conintue bc benefit Sunosi to the 150 mg tablet Rec trial reduce Ritalin to 1/2 tablet 4 times daily for 2 week s, Then stop Ritalin.  He's getting from PCP [name].  02/21/23 appt noted: Got off Ritalin. For over a month.  A little shakey at first but got over it.  Had to stop abruptly Dt shortage and out of town.   I feel better psychologically off it.  Thinks he was a prisoner to Ritalin and Sunosi freed him from that.   Wonders about weaning off Trintellix.  Trying to wean for  sex activity if possible.   Some ED and tried them in the past.  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 Doing [employer] training and enjoying it a lot.  Teaching him a lot.  Disc risks weaning Trintellix  and will start after tax season. Anxiety is better than it was.   Past Psychiatric Medication Trials:  History [name].  On and off antidepressants for 20 years  Doesn't remember any being helpful.  lexapro 10  Trintellix 10 Remote Wellbutrin 450.  Doesn't remember the names of prior meds but remembers sexual SE with one. History of Nuvigil for hypersomnolence but he does not recall the response but apparently not good enough so Ritalin was RX in it's place Sunosi 150 benefit  Review of Systems:  Review of Systems  Constitutional:  Positive for fatigue.  Eyes:  Positive for visual disturbance.  Cardiovascular:  Negative for chest pain and palpitations.  Gastrointestinal:  Negative for nausea.  Musculoskeletal:  Positive for arthralgias.  Neurological:  Negative for tremors and weakness.  Psychiatric/Behavioral:  Positive for decreased concentration. The patient is not nervous/anxious.     Medications: I have reviewed the patient's current medications.  Current Outpatient Medications  Medication Sig Dispense Refill   ciclopirox (PENLAC) 8 % solution Apply topically at bedtime. Apply over nail and surrounding skin. Apply daily over previous coat. After seven (7) days, may remove with alcohol and continue cycle. 6.6 mL 2   CONTOUR NEXT TEST test strip      Cyanocobalamin (VITAMIN B 12) 500 MCG TABS Take 2,000 Units by mouth.     magnesium 30 MG tablet Take 30 mg by mouth 2 (two) times daily.     NOVOLOG 100 UNIT/ML injection U IN INSULIN PUMP .APPROXIMATELY 80 UNITS A DAY  6   pravastatin (PRAVACHOL) 10 MG tablet 10 mg. Taking 1/2 tab of 20mg      PROLENSA 0.07 % SOLN INT 1 GTT IN OS HS  6   ramipril (ALTACE) 5 MG capsule Take 5 mg by mouth 2 (two) times  daily.     sildenafil (REVATIO) 20 MG tablet Take 20 mg by mouth as needed (take 2-5 tablets as needed for erections).     tadalafil (CIALIS) 20 MG tablet Take 1 tablet (20 mg total) by mouth daily as needed for erectile dysfunction. 15 tablet 1   terbinafine (LAMISIL) 250 MG tablet TAKE 1 TABLET(250 MG) BY MOUTH DAILY 90 tablet 0   vortioxetine HBr (TRINTELLIX) 10 MG TABS tablet Take 1 tablet (10 mg total) by mouth daily. 90 tablet 0   methylphenidate (RITALIN) 10 MG tablet Take 1 tablet (10 mg total) by mouth 4 (four) times daily. (Patient not taking: Reported on 02/21/2023) 120 tablet 0   Solriamfetol HCl (SUNOSI) 150 MG TABS Take 1 tablet (150 mg total) by mouth every morning. 90 tablet 1   No current facility-administered medications for this visit.    Medication Side Effects: None  Allergies: No Known Allergies  Past Medical History:  Diagnosis Date   Anxiety    Arthritis    Cancer ([place])    skin CA removed   Depression    Diabetes mellitus    Diabetes mellitus without complication ([place])    Type 1   Elevated PSA 11/2014   PCP ordered for patient to take Cipro for 21 days   Erectile dysfunction    Hypercholesteremia    Skin cancer of face    Sleep apnea    wears CPAP nightly    Family History  Problem Relation Age of Onset   Cancer Father        Esophageal/gastric cancer   Cancer Mother        Metastatic cancer   Rheum arthritis Mother  Jermaine cancer Neg Hx    Pancreatic cancer Neg Hx    Stomach cancer Neg Hx     Social History   Socioeconomic History   Marital status: Married    Spouse name: Not on file   Number of children: 2   Years of education: Not on file   Highest education level: Not on file  Occupational History   Occupation: Retired    Fish farm manager: [employer]  Tobacco Use   Smoking status: Former    Packs/day: 2.00    Years: 15.00    Additional pack years: 0.00    Total pack years: 30.00    Types: Cigarettes    Quit date: 1981    Years  since quitting: 43.2   Smokeless tobacco: Never  Substance and Sexual Activity   Alcohol use: No    Comment: 1986   Drug use: No   Sexual activity: Not on file  Other Topics Concern   Not on file  Social History Narrative    Merged History Encounter        Social Determinants of Health   Financial Resource Strain: Not on file  Food Insecurity: Not on file  Transportation Needs: Not on file  Physical Activity: Not on file  Stress: Not on file  Social Connections: Not on file  Intimate Partner Violence: Not on file    Past Medical History, Surgical history, Social history, and Family history were reviewed and updated as appropriate.   Please see review of systems for further details on the patient's review from today.   Objective:   Physical Exam:  There were no vitals taken for this visit.  Physical Exam Constitutional:      General: He is not in acute distress. Musculoskeletal:        General: No deformity.  Neurological:     Mental Status: He is alert and oriented to person, place, and time.     Cranial Nerves: No dysarthria.     Coordination: Coordination normal.     Gait: Gait normal.  Psychiatric:        Attention and Perception: Attention and perception normal. He does not perceive auditory or visual hallucinations.        Mood and Affect: Mood is not anxious or depressed. Affect is not labile, blunt, angry or inappropriate.        Speech: Speech normal.        Behavior: Behavior normal. Behavior is not slowed. Behavior is cooperative.        Thought Content: Thought content normal. Thought content is not paranoid or delusional. Thought content does not include homicidal or suicidal ideation. Thought content does not include suicidal plan.        Cognition and Memory: Cognition and memory normal.        Judgment: Judgment normal.     Comments: Insight intact Sx much better and [employer] training helps.     Lab Review:     Component Value  Date/Time   NA 139 12/02/2014 0842   K 4.3 12/02/2014 0842   CL 106 12/02/2014 0842   CO2 25 12/02/2014 0842   GLUCOSE 223 (H) 12/02/2014 0842   BUN 24 (H) 12/02/2014 0842   CREATININE 0.87 12/02/2014 0842   CALCIUM 8.8 12/02/2014 0842   GFRNONAA 88 (L) 12/02/2014 0842   GFRAA >90 12/02/2014 0842       Component Value Date/Time   WBC 4.9 12/02/2014 0843   RBC 4.90 12/02/2014 0843  HGB 14.8 12/02/2014 0843   HCT 44.1 12/02/2014 0843   PLT 226 12/02/2014 0843   MCV 90.0 12/02/2014 0843   MCH 30.2 12/02/2014 0843   MCHC 33.6 12/02/2014 0843   RDW 13.1 12/02/2014 0843    No results found for: "POCLITH", "LITHIUM"   No results found for: "PHENYTOIN", "PHENOBARB", "VALPROATE", "CBMZ"   .res Assessment: Plan:    Theseus was seen today for follow-up.  Diagnoses and all orders for this visit:  Major depressive disorder, recurrent episode, moderate (HCC)  Generalized anxiety disorder  Sleep apnea with hypersomnolence -     Solriamfetol HCl (SUNOSI) 150 MG TABS; Take 1 tablet (150 mg total) by mouth every morning.  Early onset dysthymia  Obstructive sleep apnea  Marital conflict  Other orders -     tadalafil (CIALIS) 20 MG tablet; Take 1 tablet (20 mg total) by mouth daily as needed for erectile dysfunction.    Greater than 50% of 30 min  with patient was spent on counseling and coordination of care. We discussed When first seen he denied significant depressive symptoms although his wife thought he was depressed.    He realizes he also needs therapy for this tendency to self sabotage and also for marital conflict that has resulted.  Ongoing conflict with his wife. Was much less depressed and stressed when wife was away . Sx much better and [employer] training helps.  Encourage activity in retirement.  Disc examples like classes, [facility].  Disc poss separation to diminish hostility in the relationship, which in turn might help his depression bc she is  highly critical of him.  Disc concerns about wife who is a pt.  She's moving to fast and angry.    Trintellix 10 mg daily.  Generally good response and no SE. Disc option of increasing the dose to see if more motivation and activity. Disc SE .   Ok wean after tax season.  Hypersomnolence predates antidepressants and failed to respond to armodafinil but is improved though not resolved with Ritalin 10 mg 4 times daily but  Conintue bc benefit Sunosi to the 150 mg tablet Successful off Ritalin  Ok retrial Cialis 10-20 mg prn.   Disc se and dosing range.  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 Still enjoys that.  Has been in counseling.     After Visit Summary for patient specific instructions  FU 2 months  Lynder Parents, MD, DFAPA   Future Appointments  Date Time Provider Lynn  03/01/2023  7:30 AM Hayden Pedro, MD [facility] None  04/20/2023 10:00 AM Cottle, Billey Co., MD CP[facility]CP None  10/24/2023  8:45 AM Frann Rider, NP [facility] None    No orders of the defined types were placed in this encounter.    -------------------------------

## 2023-02-24 DIAGNOSIS — M1712 Unilateral primary osteoarthritis, left knee: Secondary | ICD-10-CM | POA: Diagnosis not present

## 2023-02-24 DIAGNOSIS — M170 Bilateral primary osteoarthritis of knee: Secondary | ICD-10-CM | POA: Diagnosis not present

## 2023-02-28 DIAGNOSIS — E785 Hyperlipidemia, unspecified: Secondary | ICD-10-CM | POA: Diagnosis not present

## 2023-02-28 DIAGNOSIS — E1040 Type 1 diabetes mellitus with diabetic neuropathy, unspecified: Secondary | ICD-10-CM | POA: Diagnosis not present

## 2023-03-01 ENCOUNTER — Encounter (INDEPENDENT_AMBULATORY_CARE_PROVIDER_SITE_OTHER): Payer: Federal, State, Local not specified - PPO | Admitting: Ophthalmology

## 2023-03-01 DIAGNOSIS — I10 Essential (primary) hypertension: Secondary | ICD-10-CM | POA: Diagnosis not present

## 2023-03-01 DIAGNOSIS — H35033 Hypertensive retinopathy, bilateral: Secondary | ICD-10-CM

## 2023-03-01 DIAGNOSIS — E113512 Type 2 diabetes mellitus with proliferative diabetic retinopathy with macular edema, left eye: Secondary | ICD-10-CM

## 2023-03-01 DIAGNOSIS — E113591 Type 2 diabetes mellitus with proliferative diabetic retinopathy without macular edema, right eye: Secondary | ICD-10-CM | POA: Diagnosis not present

## 2023-03-01 DIAGNOSIS — H34812 Central retinal vein occlusion, left eye, with macular edema: Secondary | ICD-10-CM

## 2023-03-01 DIAGNOSIS — H43813 Vitreous degeneration, bilateral: Secondary | ICD-10-CM

## 2023-03-03 DIAGNOSIS — F331 Major depressive disorder, recurrent, moderate: Secondary | ICD-10-CM | POA: Diagnosis not present

## 2023-03-07 DIAGNOSIS — M1712 Unilateral primary osteoarthritis, left knee: Secondary | ICD-10-CM | POA: Diagnosis not present

## 2023-03-07 DIAGNOSIS — M546 Pain in thoracic spine: Secondary | ICD-10-CM | POA: Diagnosis not present

## 2023-03-07 DIAGNOSIS — M545 Low back pain, unspecified: Secondary | ICD-10-CM | POA: Diagnosis not present

## 2023-03-07 DIAGNOSIS — R269 Unspecified abnormalities of gait and mobility: Secondary | ICD-10-CM | POA: Diagnosis not present

## 2023-03-14 DIAGNOSIS — M1712 Unilateral primary osteoarthritis, left knee: Secondary | ICD-10-CM | POA: Diagnosis not present

## 2023-03-14 DIAGNOSIS — R269 Unspecified abnormalities of gait and mobility: Secondary | ICD-10-CM | POA: Diagnosis not present

## 2023-03-14 DIAGNOSIS — M546 Pain in thoracic spine: Secondary | ICD-10-CM | POA: Diagnosis not present

## 2023-03-14 DIAGNOSIS — M545 Low back pain, unspecified: Secondary | ICD-10-CM | POA: Diagnosis not present

## 2023-03-15 DIAGNOSIS — F331 Major depressive disorder, recurrent, moderate: Secondary | ICD-10-CM | POA: Diagnosis not present

## 2023-03-21 DIAGNOSIS — M546 Pain in thoracic spine: Secondary | ICD-10-CM | POA: Diagnosis not present

## 2023-03-21 DIAGNOSIS — R269 Unspecified abnormalities of gait and mobility: Secondary | ICD-10-CM | POA: Diagnosis not present

## 2023-03-21 DIAGNOSIS — M545 Low back pain, unspecified: Secondary | ICD-10-CM | POA: Diagnosis not present

## 2023-03-21 DIAGNOSIS — M1712 Unilateral primary osteoarthritis, left knee: Secondary | ICD-10-CM | POA: Diagnosis not present

## 2023-03-24 ENCOUNTER — Ambulatory Visit: Payer: Federal, State, Local not specified - PPO | Admitting: Psychiatry

## 2023-03-24 DIAGNOSIS — M1712 Unilateral primary osteoarthritis, left knee: Secondary | ICD-10-CM | POA: Diagnosis not present

## 2023-03-24 DIAGNOSIS — R222 Localized swelling, mass and lump, trunk: Secondary | ICD-10-CM | POA: Diagnosis not present

## 2023-03-24 DIAGNOSIS — E10319 Type 1 diabetes mellitus with unspecified diabetic retinopathy without macular edema: Secondary | ICD-10-CM | POA: Diagnosis not present

## 2023-03-24 DIAGNOSIS — Z1339 Encounter for screening examination for other mental health and behavioral disorders: Secondary | ICD-10-CM | POA: Diagnosis not present

## 2023-03-24 DIAGNOSIS — Z794 Long term (current) use of insulin: Secondary | ICD-10-CM | POA: Diagnosis not present

## 2023-03-24 DIAGNOSIS — F900 Attention-deficit hyperactivity disorder, predominantly inattentive type: Secondary | ICD-10-CM | POA: Diagnosis not present

## 2023-03-24 DIAGNOSIS — E785 Hyperlipidemia, unspecified: Secondary | ICD-10-CM | POA: Diagnosis not present

## 2023-03-24 DIAGNOSIS — Z1331 Encounter for screening for depression: Secondary | ICD-10-CM | POA: Diagnosis not present

## 2023-03-24 DIAGNOSIS — E1051 Type 1 diabetes mellitus with diabetic peripheral angiopathy without gangrene: Secondary | ICD-10-CM | POA: Diagnosis not present

## 2023-03-24 DIAGNOSIS — G4733 Obstructive sleep apnea (adult) (pediatric): Secondary | ICD-10-CM | POA: Diagnosis not present

## 2023-03-24 DIAGNOSIS — I739 Peripheral vascular disease, unspecified: Secondary | ICD-10-CM | POA: Diagnosis not present

## 2023-03-25 ENCOUNTER — Other Ambulatory Visit: Payer: Self-pay | Admitting: Internal Medicine

## 2023-03-25 DIAGNOSIS — R222 Localized swelling, mass and lump, trunk: Secondary | ICD-10-CM

## 2023-03-29 DIAGNOSIS — F331 Major depressive disorder, recurrent, moderate: Secondary | ICD-10-CM | POA: Diagnosis not present

## 2023-04-04 DIAGNOSIS — M545 Low back pain, unspecified: Secondary | ICD-10-CM | POA: Diagnosis not present

## 2023-04-04 DIAGNOSIS — M546 Pain in thoracic spine: Secondary | ICD-10-CM | POA: Diagnosis not present

## 2023-04-04 DIAGNOSIS — R269 Unspecified abnormalities of gait and mobility: Secondary | ICD-10-CM | POA: Diagnosis not present

## 2023-04-04 DIAGNOSIS — M1712 Unilateral primary osteoarthritis, left knee: Secondary | ICD-10-CM | POA: Diagnosis not present

## 2023-04-20 ENCOUNTER — Encounter: Payer: Self-pay | Admitting: Psychiatry

## 2023-04-20 ENCOUNTER — Ambulatory Visit (INDEPENDENT_AMBULATORY_CARE_PROVIDER_SITE_OTHER): Payer: Medicare Other | Admitting: Psychiatry

## 2023-04-20 DIAGNOSIS — F411 Generalized anxiety disorder: Secondary | ICD-10-CM

## 2023-04-20 DIAGNOSIS — F331 Major depressive disorder, recurrent, moderate: Secondary | ICD-10-CM | POA: Diagnosis not present

## 2023-04-20 DIAGNOSIS — G4730 Sleep apnea, unspecified: Secondary | ICD-10-CM | POA: Diagnosis not present

## 2023-04-20 DIAGNOSIS — G4710 Hypersomnia, unspecified: Secondary | ICD-10-CM | POA: Diagnosis not present

## 2023-04-20 DIAGNOSIS — F341 Dysthymic disorder: Secondary | ICD-10-CM

## 2023-04-20 MED ORDER — VORTIOXETINE HBR 10 MG PO TABS: 10.0000 mg | ORAL_TABLET | Freq: Every day | ORAL | 1 refills | Status: DC

## 2023-04-20 NOTE — Progress Notes (Signed)
Jermaine Waters [id number] 1946/06/21 77 y.o.   Subjective:   Patient ID:  Jermaine Waters is a 77 y.o. (DOB 1946-10-16) male.  Chief Complaint:  Chief Complaint  Patient presents with   Follow-up   Depression   Anxiety    Depression        Associated symptoms include decreased concentration and fatigue.  Past medical history includes anxiety.   Anxiety Symptoms include decreased concentration. Patient reports no chest pain, nausea, nervous/anxious behavior or palpitations.     Jermaine Waters presents to the office today for follow-up of mood concerns and hypersomnolence.    Patient was seen August 08, 2019.  The assessment was depression.  The following was done: STOP  bupropion 75 mg. Start bupropion XL 150 mg tablets 1 each morning for 2 weeks. If no side effects such as jitteriness, then increase to 2 tablets each morning which equals 300 mg daily. He continued Ritalin 10 QID.  Patient was seen October 10, 2019.  He had a partial response with Wellbutrin XL 300 mg daily.  He was tolerating it and therefore the dosage was increased to 450 mg a day. He continued Ritalin 10 mg 4 times daily No Covid problems.   Didn't notice much difference with increase Wellbutrin.  Wife thinks he's a little more lethargic.  In some ways a little happier but really struggling with wife.  Pending temporary separation for a few months to try to provide some healing.  Hopefully will help both of them.   Wife concerned about Ritalin Rx for alertness DT OSA and central apnea that even affects him while awake.  PT meds could fall asleep driving.  Ritalin helps and CPAP helps.  Helps alerness and no insomnia.  Wonders about SE of it. A little more optimistic and less tense.  Therapy helpng also. No SE.  Not jitttery nor edgy.  Wife critical that he's less responsive, but maybe I'm less on edge. Energy is fine.  Interests hard to answer bc not a lot of outside activities left DT Covid.  Couple of small  groups meeting with Zoom.  Most of free time before was singing but can't do it now. Things changed and wants meds and therapy.  PCP started Wellbutrin a couple of weeks ago. All my life self-sabotage with jobs and has to restart.  Fear of failure.  Some worsening health issues dropping productivity.  Wife frustrated with him and constantly advising and critical.  Marital stress and unhappiness worse bc together all the time. Plan because of residual depression consideration was given to augment with Abilify.  He was in the process of moving and he felt that perhaps after the move he might have a different view and he wanted to defer.  So no meds were changed.  Appointment Apr 07, 2020, the following was noted: He did get moved.  Still having conflict with his wife which is a chronic stress. Pt reports that mood is Anxious, Depressed and Dysphoric and describes depression and anxiety as mild or mild to moderate but overall improved since last visit.. Anxiety symptoms include: Excessive Worry, avoidant of stress or pressure or commitments. . Pt reports no sleep issues and 7 hours but prefers 8 hours. Pt reports that appetite is good. Pt reports that energy is poor and anhedonia, poor motivation and withdrawn from usual activities. Concentration is down slightly. Suicidal thoughts:  denied by patient. Plan:  No med changes  05/21/2020 appointment the following is noted: Urgent appt at his request.  One concern is more anxiety and procrastinates.Jermaine Waters if he's working on the wrong problem.  Jermaine Waters of everything.  ? Fear of failure.  Easily stressed by small demands.  Stress averse and not operating at full capacity. No history of anxiety meds.   Counseling. Ritalin 10 QID for hypersomnolence but questions ADD bc is distractible.  Sleep apnea for 20 years on CPAP.  Chronically sleepy. Plan: no med changes:  08/26/2020 appointment with the following noted: Increased Wellbutrin to 450 mg has worked  fine. Feeling great.  Only negative is sleepy if sits down to read. Sleepy all his life and long term CPAP user.  Started class 4 year [place]. No trouble with [place] since July 20 and no anxiety while she's gone.   Plan: Doing well.  No med changes  12/31/2020 appointment with following noted: Wife joined per patient request Overall OK but somewhat lifeless and disconnected and wonders if med related or not and if changes can be made. Otherwise not anxious.  Not joyful.   Wife Jermaine Waters has different perspective in some areas.  She thinks he's too inactive.  She says since retirement he lacks drive to do much.  Rare exercise. Plan: Reduce the Lexapro to 5 mg nightly to see if it is having a dulling effect   02/25/2021 appointment with the following noted: No difference noted with less Lexapro.  Overall sort of cloudy and in my own world.  Wife CO his memory issues.  Depressed, frustrated, overwhelmed and probably worse after the reduction in meds. Caring for wife with TKR.  Stressful relationship very. Does go out 3-4 nights per week with various groups and some church activity.  Plan: DC Lexapro and start duloxetine 30 mg for a week then 60 mg daily. Continue Wellbutrin XL 450 mg daily Continue Ritalin 10 mg 4 times daily  06/01/2021 appointment with the following noted: A little more irritable.  Esp with his wife.  Wife complains about him.  He's afraid of saying something that upsets her. Chronic sleepiness is worse he thinks.  Thinks it's med related. Depression is unchanged.   Plan: Wean duloxetine using 20 mg capsules to 40 mg daily for 2 weeks then 20 mg daily for 2 weeks then stop it in order to prevent withdrawal symptoms.  He will pay attention to see if irritability and alertness are improved. Continue Wellbutrin XL 450 mg daily Continue Ritalin 10 mg tablets 4 times daily  08/20/2021 appointment with the following noted: At times doesn't think any meds are doing  anything and doesn't remember meds helping him.  Wonders if he can get off the Wellbutrin Neuropsych testing is not suggesting dementia. Still chronic marital problems no better and hostile relationship.  Talking about separation.  Working to sell the house. Not highly motivated.  Knows he'd feel better separated from wife.  Can find things he enjoys at home.  Thinks he's underachieved in life.  Occ feels hopeless.  Lists of things he needs to do with constant sense of procrastination on things necessary to do. Plan: Wean Wellbutrin and start Trintellix 5 mg daily for 4 to 7 days and if there is no nausea then increase to 10 mg daily.  09/16/21 appt noted: Just increased Trintellix to 10 mg daily 2 days ago.  No SE noted. Off Wellbutrin. Signed contract to sell house by end of month and wife gone off deep-end and not functioning and is at home in bed.  She's having more emotional  outbursts and "definitely hyped".  He thinks she's hyper and talking constantly and more aggressive, slamming doors etc than usual. Overall he feels stronger and happier with change in meds.  He'd like to contrinue it.   Plan continue Trintellix 10 mg daily and Ritalin 10 mg 4 times daily  10/20/2021 appointment with the following noted: Trintellix working feeling stronger, lighter, generally more optimistic.  In complete contrast to wife.  I'm glad I'm on Trintellix. Satisfied with Ritalin.  Missed 4 days and was very tired.  On it 20 years. Overall satisfied with meds. Remains under high stress with wife and at home. No SE. No med changes  01/20/2022 appointment with the following noted: No Still on Trintellix 10 and Ritalin and making life better. No SE. Better mood has been maintained. No problems with sleep.  Using CPAP Satisfied with Ritalin.  Missed 4 days and was very tired.  On it 20 years. Still sings in group.    09/02/22 appt noted: Ongoing marital stress but maybe some progress.  Overall thinks she's  still depressed. This causes him to wrestle too but counseling creates some issues.  Joint sessions with Jermaine Waters are helping.   Continues Trintellix 10 and Ritalin 10 QID. Compliant without a problem.   Continues singing. Normally 6-7 hours of sleep but even if 9 hours falls asleep reading. CPAP working properly.  Consistent with it. Mood about the same as Nov 2022.   No SE.   Plan: cont Trintellix 10 Start one half of 75 mg tablet Sunosi in the morning for 3 days, Then increase Sunosi to 175 mg tablet in the morning for 1 week, Then if needed increase Sunosi to 1-1/2 of the 75 mg tablets in the morning for 1 week, Then if needed increase Sunosi to the 150 mg tablet If it is helpful reduce Ritalin to 1/2 tablet 4 times daily for 2 week s, Then stop Ritalin  11/23/22 appt noted: Sunosi helpful and is more awake and it really works. More motivated to keep moving.  Gotten into habit or trying to go to bed reasonable hour unless gets stuck on computer on phone.  Can find endless distractions.  Situation is amplifying that. No SE. Still taking Ritalin. Tendency to be very withdrawn.   Chronic tension with wife and tends to avoid her bc her behavior.   Stilll using CPAP.   Seems to be anxious.  Like my world is collapsing .  Busy beating himself up on what he's not doing and W  jumps on board to be critical.   She is out of control with criticising him over trivial matters.  Criticism is unending.  Plan: continue Trintellix 10  Conintue bc benefit Sunosi to the 150 mg tablet Rec trial reduce Ritalin to 1/2 tablet 4 times daily for 2 week s, Then stop Ritalin.  He's getting from PCP Jermaine Waters.  02/21/23 appt noted: Got off Ritalin. For over a month.  A little shakey at first but got over it.  Had to stop abruptly Dt shortage and out of town.   I feel better psychologically off it.  Thinks he was a prisoner to Ritalin and Sunosi freed him from that.   Wonders about weaning off  Trintellix.  Trying to wean for sex activity if possible.   Some ED and tried them in the past. 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 Doing [employer] training and enjoying it a lot.  Teaching him a lot.  Disc risks weaning Trintellix and will start after tax season. Anxiety is better than it was. Plan: Conintue bc benefit Sunosi to the 150 mg tablet Successful off Ritalin Okay to try to wean off Trintellix after tax season per his request.  Discussed the risk of relapse.  04/20/23 appt noted: Right now mood is good and feels satisfied with things except marriage.  Says even others , friends notice wife's anger problems.   When tried higher dose Trintellix didn't see benefit and had vague SE.  Seemed like it was too high.  But didn't stop it.   Current psych meds: Trintellix 10, sunosi 150 AM No current SE.   Overall feels more "stable" on Sunosi vs Ritalin.  Easier to take with less worrying about how it affects him.  Generally prefers to be on less med.   Has had low periods of depression .   Enjoys barbershop singing.  Active.     Past Psychiatric Medication Trials:  History Jermaine Waters.  On and off antidepressants for 20 years  Doesn't remember any being helpful.  lexapro 10  Trintellix 10, 15 mg not more helpful and vague SE Remote Wellbutrin 450.  Doesn't remember the names of prior meds but remembers sexual SE with one. History of Nuvigil for hypersomnolence but he does not recall the response but apparently not good enough so Ritalin was RX in it's place Sunosi 150 benefit  Review of Systems:  Review of Systems  Constitutional:  Positive for fatigue.  Eyes:  Positive for visual disturbance.  Cardiovascular:  Negative for chest pain and palpitations.  Gastrointestinal:  Negative for nausea.  Musculoskeletal:  Positive for arthralgias.  Neurological:  Negative for tremors and weakness.  Psychiatric/Behavioral:  Positive for decreased concentration.  Negative for dysphoric mood. The patient is not nervous/anxious.     Medications: I have reviewed the patient's current medications.  Current Outpatient Medications  Medication Sig Dispense Refill   ciclopirox (PENLAC) 8 % solution Apply topically at bedtime. Apply over nail and surrounding skin. Apply daily over previous coat. After seven (7) days, may remove with alcohol and continue cycle. 6.6 mL 2   CONTOUR NEXT TEST test strip      Cyanocobalamin (VITAMIN B 12) 500 MCG TABS Take 2,000 Units by mouth.     magnesium 30 MG tablet Take 30 mg by mouth 2 (two) times daily.     NOVOLOG 100 UNIT/ML injection U IN INSULIN PUMP .APPROXIMATELY 80 UNITS A DAY  6   pravastatin (PRAVACHOL) 10 MG tablet 10 mg. Taking 1/2 tab of 20mg      PROLENSA 0.07 % SOLN INT 1 GTT IN OS HS  6   ramipril (ALTACE) 5 MG capsule Take 5 mg by mouth 2 (two) times daily.     sildenafil (REVATIO) 20 MG tablet Take 20 mg by mouth as needed (take 2-5 tablets as needed for erections).     Solriamfetol HCl (SUNOSI) 150 MG TABS Take 1 tablet (150 mg total) by mouth every morning. 90 tablet 1   tadalafil (CIALIS) 20 MG tablet Take 1 tablet (20 mg total) by mouth daily as needed for erectile dysfunction. 15 tablet 1   terbinafine (LAMISIL) 250 MG tablet TAKE 1 TABLET(250 MG) BY MOUTH DAILY 90 tablet 0   vortioxetine HBr (TRINTELLIX) 10 MG TABS tablet Take 1 tablet (10 mg total) by mouth daily. 90 tablet 1   No current facility-administered medications for this visit.    Medication Side Effects: None  Allergies: No  Known Allergies  Past Medical History:  Diagnosis Date   Anxiety    Arthritis    Cancer (HCC)    skin CA removed   Depression    Diabetes mellitus    Diabetes mellitus without complication (HCC)    Type 1   Elevated PSA 11/2014   PCP ordered for patient to take Cipro for 21 days   Erectile dysfunction    Hypercholesteremia    Skin cancer of face    Sleep apnea    wears CPAP nightly    Family  History  Problem Relation Age of Onset   Cancer Father        Esophageal/gastric cancer   Cancer Mother        Metastatic cancer   Rheum arthritis Mother    Colon cancer Neg Hx    Pancreatic cancer Neg Hx    Stomach cancer Neg Hx     Social History   Socioeconomic History   Marital status: Married    Spouse name: Not on file   Number of children: 2   Years of education: Not on file   Highest education level: Not on file  Occupational History   Occupation: Retired    Associate Professor: [employer]  Tobacco Use   Smoking status: Former    Packs/day: 2.00    Years: 15.00    Additional pack years: 0.00    Total pack years: 30.00    Types: Cigarettes    Quit date: 1981    Years since quitting: 43.3   Smokeless tobacco: Never  Substance and Sexual Activity   Alcohol use: No    Comment: 1986   Drug use: No   Sexual activity: Not on file  Other Topics Concern   Not on file  Social History Narrative    Merged History Encounter        Social Determinants of Health   Financial Resource Strain: Not on file  Food Insecurity: Not on file  Transportation Needs: Not on file  Physical Activity: Not on file  Stress: Not on file  Social Connections: Not on file  Intimate Partner Violence: Not on file    Past Medical History, Surgical history, Social history, and Family history were reviewed and updated as appropriate.   Please see review of systems for further details on the patient's review from today.   Objective:   Physical Exam:  There were no vitals taken for this visit.  Physical Exam Constitutional:      General: He is not in acute distress. Musculoskeletal:        General: No deformity.  Neurological:     Mental Status: He is alert and oriented to person, place, and time.     Cranial Nerves: No dysarthria.     Coordination: Coordination normal.     Gait: Gait normal.  Psychiatric:        Attention and Perception: Attention and perception normal. He does not  perceive auditory or visual hallucinations.        Mood and Affect: Mood is not anxious or depressed. Affect is not labile, blunt or inappropriate.        Speech: Speech normal.        Behavior: Behavior normal. Behavior is not slowed. Behavior is cooperative.        Thought Content: Thought content normal. Thought content is not paranoid or delusional. Thought content does not include homicidal or suicidal ideation. Thought content does not include suicidal plan.  Cognition and Memory: Cognition and memory normal.        Judgment: Judgment normal.     Comments: Insight intact Sx much better and [employer] training helps.     Lab Review:     Component Value Date/Time   NA 139 12/02/2014 0842   K 4.3 12/02/2014 0842   CL 106 12/02/2014 0842   CO2 25 12/02/2014 0842   GLUCOSE 223 (H) 12/02/2014 0842   BUN 24 (H) 12/02/2014 0842   CREATININE 0.87 12/02/2014 0842   CALCIUM 8.8 12/02/2014 0842   GFRNONAA 88 (L) 12/02/2014 0842   GFRAA >90 12/02/2014 0842       Component Value Date/Time   WBC 4.9 12/02/2014 0843   RBC 4.90 12/02/2014 0843   HGB 14.8 12/02/2014 0843   HCT 44.1 12/02/2014 0843   PLT 226 12/02/2014 0843   MCV 90.0 12/02/2014 0843   MCH 30.2 12/02/2014 0843   MCHC 33.6 12/02/2014 0843   RDW 13.1 12/02/2014 0843    No results found for: "POCLITH", "LITHIUM"   No results found for: "PHENYTOIN", "PHENOBARB", "VALPROATE", "CBMZ"   .res Assessment: Plan:    Jermaine Waters was seen today for follow-up, depression and anxiety.  Diagnoses and all orders for this visit:  Major depressive disorder, recurrent episode, moderate (HCC) -     vortioxetine HBr (TRINTELLIX) 10 MG TABS tablet; Take 1 tablet (10 mg total) by mouth daily.  Generalized anxiety disorder -     vortioxetine HBr (TRINTELLIX) 10 MG TABS tablet; Take 1 tablet (10 mg total) by mouth daily.  Sleep apnea with hypersomnolence  Early onset dysthymia -     vortioxetine HBr (TRINTELLIX) 10 MG  TABS tablet; Take 1 tablet (10 mg total) by mouth daily.    30 min  We discussed When first seen he denied significant depressive symptoms although his wife thought he was depressed.    He realizes he also needs therapy for this tendency to self sabotage and also for marital conflict that has resulted.  Ongoing conflict with his wife. Was much less depressed and stressed when wife was away . Sx much better and [employer] training helps.  Encourage activity in retirement.  Disc examples like classes, [facility].  Disc poss separation to diminish hostility in the relationship, which in turn might help his depression bc she is highly critical of him.  Disc concerns about wife who is a pt.  She's moving to fast and angry.    Trintellix 10 mg daily.  Generally good response and no SE. Disc option of increasing the dose to see if more motivation and activity. Disc SE .   Advise against stopping bc recent 2 week depression but genreally he wants to be off meds if possible.  Disc pros and cons of stopping it.  "For now let's stick with what we're doing"  Hypersomnolence predates antidepressants and failed to respond to armodafinil but is improved though not resolved with Ritalin 10 mg 4 times daily but even better with Sunosi than Ritalin Conintue bc benefit Sunosi to the 150 mg tablet Successful off Ritalin  Cialis 10-20 mg prn.   Disc se and dosing range.  No med changes  Sober for years but decided to restart AA and finish what he's started and he thinks it's fantastic. Still enjoys that.  Has been in counseling.     After Visit Summary for patient specific instructions  FU 3-4 months  Meredith Staggers, MD, DFAPA   Future Appointments  Date Time Provider Department Center  05/10/2023  7:30 AM Sherrie George, MD [facility] None  05/18/2023  9:30 AM Cottle, Steva Ready., MD [facility] None  10/24/2023  8:45 AM Ihor Austin, NP [facility] None    No orders of the defined types were  placed in this encounter.    -------------------------------

## 2023-04-21 DIAGNOSIS — F331 Major depressive disorder, recurrent, moderate: Secondary | ICD-10-CM | POA: Diagnosis not present

## 2023-04-21 DIAGNOSIS — M1712 Unilateral primary osteoarthritis, left knee: Secondary | ICD-10-CM | POA: Diagnosis not present

## 2023-04-28 DIAGNOSIS — M1712 Unilateral primary osteoarthritis, left knee: Secondary | ICD-10-CM | POA: Diagnosis not present

## 2023-05-05 DIAGNOSIS — F331 Major depressive disorder, recurrent, moderate: Secondary | ICD-10-CM | POA: Diagnosis not present

## 2023-05-05 DIAGNOSIS — M1712 Unilateral primary osteoarthritis, left knee: Secondary | ICD-10-CM | POA: Diagnosis not present

## 2023-05-10 ENCOUNTER — Encounter (INDEPENDENT_AMBULATORY_CARE_PROVIDER_SITE_OTHER): Payer: Federal, State, Local not specified - PPO | Admitting: Ophthalmology

## 2023-05-10 DIAGNOSIS — H35033 Hypertensive retinopathy, bilateral: Secondary | ICD-10-CM

## 2023-05-10 DIAGNOSIS — H34812 Central retinal vein occlusion, left eye, with macular edema: Secondary | ICD-10-CM

## 2023-05-10 DIAGNOSIS — I10 Essential (primary) hypertension: Secondary | ICD-10-CM | POA: Diagnosis not present

## 2023-05-10 DIAGNOSIS — E113591 Type 2 diabetes mellitus with proliferative diabetic retinopathy without macular edema, right eye: Secondary | ICD-10-CM

## 2023-05-10 DIAGNOSIS — H43813 Vitreous degeneration, bilateral: Secondary | ICD-10-CM

## 2023-05-10 DIAGNOSIS — E113512 Type 2 diabetes mellitus with proliferative diabetic retinopathy with macular edema, left eye: Secondary | ICD-10-CM

## 2023-05-17 DIAGNOSIS — Z85828 Personal history of other malignant neoplasm of skin: Secondary | ICD-10-CM | POA: Diagnosis not present

## 2023-05-17 DIAGNOSIS — L821 Other seborrheic keratosis: Secondary | ICD-10-CM | POA: Diagnosis not present

## 2023-05-17 DIAGNOSIS — L218 Other seborrheic dermatitis: Secondary | ICD-10-CM | POA: Diagnosis not present

## 2023-05-17 DIAGNOSIS — D692 Other nonthrombocytopenic purpura: Secondary | ICD-10-CM | POA: Diagnosis not present

## 2023-05-17 DIAGNOSIS — D1801 Hemangioma of skin and subcutaneous tissue: Secondary | ICD-10-CM | POA: Diagnosis not present

## 2023-05-17 DIAGNOSIS — L720 Epidermal cyst: Secondary | ICD-10-CM | POA: Diagnosis not present

## 2023-05-17 DIAGNOSIS — L812 Freckles: Secondary | ICD-10-CM | POA: Diagnosis not present

## 2023-05-18 ENCOUNTER — Encounter: Payer: Self-pay | Admitting: Psychiatry

## 2023-05-18 ENCOUNTER — Ambulatory Visit (INDEPENDENT_AMBULATORY_CARE_PROVIDER_SITE_OTHER): Payer: Medicare Other | Admitting: Psychiatry

## 2023-05-18 DIAGNOSIS — G4710 Hypersomnia, unspecified: Secondary | ICD-10-CM | POA: Diagnosis not present

## 2023-05-18 DIAGNOSIS — G4733 Obstructive sleep apnea (adult) (pediatric): Secondary | ICD-10-CM | POA: Diagnosis not present

## 2023-05-18 DIAGNOSIS — F341 Dysthymic disorder: Secondary | ICD-10-CM

## 2023-05-18 DIAGNOSIS — Z630 Problems in relationship with spouse or partner: Secondary | ICD-10-CM | POA: Diagnosis not present

## 2023-05-18 DIAGNOSIS — F411 Generalized anxiety disorder: Secondary | ICD-10-CM

## 2023-05-18 DIAGNOSIS — G4730 Sleep apnea, unspecified: Secondary | ICD-10-CM | POA: Diagnosis not present

## 2023-05-18 DIAGNOSIS — F331 Major depressive disorder, recurrent, moderate: Secondary | ICD-10-CM | POA: Diagnosis not present

## 2023-05-18 NOTE — Progress Notes (Signed)
Jermaine Waters [id number] 17-Sep-1946 77 y.o.   Subjective:   Patient ID:  Jermaine Waters is a 77 y.o. (DOB 04/27/1946) male.  Chief Complaint:  No chief complaint on file.   Depression        Associated symptoms include decreased concentration.  Associated symptoms include no fatigue.  Past medical history includes anxiety.   Anxiety Symptoms include decreased concentration. Patient reports no chest pain, nausea, nervous/anxious behavior or palpitations.     Jermaine Waters presents to the office today for follow-up of mood concerns and hypersomnolence.    Patient was seen August 08, 2019.  The assessment was depression.  The following was done: STOP  bupropion 75 mg. Start bupropion XL 150 mg tablets 1 each morning for 2 weeks. If no side effects such as jitteriness, then increase to 2 tablets each morning which equals 300 mg daily. He continued Ritalin 10 QID.  Patient was seen October 10, 2019.  He had a partial response with Wellbutrin XL 300 mg daily.  He was tolerating it and therefore the dosage was increased to 450 mg a day. He continued Ritalin 10 mg 4 times daily No Covid problems.   Didn't notice much difference with increase Wellbutrin.  Wife thinks he's a little more lethargic.  In some ways a little happier but really struggling with wife.  Pending temporary separation for a few months to try to provide some healing.  Hopefully will help both of them.   Wife concerned about Ritalin Rx for alertness DT OSA and central apnea that even affects him while awake.  PT meds could fall asleep driving.  Ritalin helps and CPAP helps.  Helps alerness and no insomnia.  Wonders about SE of it. A little more optimistic and less tense.  Therapy helpng also. No SE.  Not jitttery nor edgy.  Wife critical that he's less responsive, but maybe I'm less on edge. Energy is fine.  Interests hard to answer bc not a lot of outside activities left DT Covid.  Couple of small groups meeting with  [employer].  Most of free time before was singing but can't do it now. Things changed and wants meds and therapy.  PCP started Wellbutrin a couple of weeks ago. All my life self-sabotage with jobs and has to restart.  Fear of failure.  Some worsening health issues dropping productivity.  Wife frustrated with him and constantly advising and critical.  Marital stress and unhappiness worse bc together all the time. Plan because of residual depression consideration was given to augment with Abilify.  He was in the process of moving and he felt that perhaps after the move he might have a different view and he wanted to defer.  So no meds were changed.  Appointment Apr 07, 2020, the following was noted: He did get moved.  Still having conflict with his wife which is a chronic stress. Pt reports that mood is Anxious, Depressed and Dysphoric and describes depression and anxiety as mild or mild to moderate but overall improved since last visit.. Anxiety symptoms include: Excessive Worry, avoidant of stress or pressure or commitments. . Pt reports no sleep issues and 7 hours but prefers 8 hours. Pt reports that appetite is good. Pt reports that energy is poor and anhedonia, poor motivation and withdrawn from usual activities. Concentration is down slightly. Suicidal thoughts:  denied by patient. Plan:  No med changes  05/21/2020 appointment the following is noted: Urgent appt at his request. One concern is more anxiety and procrastinates.Marland Kitchen  Wonders if he's working on the wrong problem.  Jermaine Waters of everything.  ? Fear of failure.  Easily stressed by small demands.  Stress averse and not operating at full capacity. No history of anxiety meds.   Counseling. Ritalin 10 QID for hypersomnolence but questions ADD bc is distractible.  Sleep apnea for 20 years on CPAP.  Chronically sleepy. Plan: no med changes:  08/26/2020 appointment with the following noted: Increased Wellbutrin to 450 mg has worked fine. Feeling great.   Only negative is sleepy if sits down to read. Sleepy all his life and long term CPAP user.  Started class 4 year [place]. No trouble with AK since July 20 and no anxiety while she's gone.   Plan: Doing well.  No med changes  12/31/2020 appointment with following noted: Wife joined per patient request Overall OK but somewhat lifeless and disconnected and wonders if med related or not and if changes can be made. Otherwise not anxious.  Not joyful.   Wife Jermaine Waters has different perspective in some areas.  She thinks he's too inactive.  She says since retirement he lacks drive to do much.  Rare exercise. Plan: Reduce the Lexapro to 5 mg nightly to see if it is having a dulling effect   02/25/2021 appointment with the following noted: No difference noted with less Lexapro.  Overall sort of cloudy and in my own world.  Wife CO his memory issues.  Depressed, frustrated, overwhelmed and probably worse after the reduction in meds. Caring for wife with TKR.  Stressful relationship very. Does go out 3-4 nights per week with various groups and some church activity.  Plan: DC Lexapro and start duloxetine 30 mg for a week then 60 mg daily. Continue Wellbutrin XL 450 mg daily Continue Ritalin 10 mg 4 times daily  06/01/2021 appointment with the following noted: A little more irritable.  Esp with his wife.  Wife complains about him.  He's afraid of saying something that upsets her. Chronic sleepiness is worse he thinks.  Thinks it's med related. Depression is unchanged.   Plan: Wean duloxetine using 20 mg capsules to 40 mg daily for 2 weeks then 20 mg daily for 2 weeks then stop it in order to prevent withdrawal symptoms.  He will pay attention to see if irritability and alertness are improved. Continue Wellbutrin XL 450 mg daily Continue Ritalin 10 mg tablets 4 times daily  08/20/2021 appointment with the following noted: At times doesn't think any meds are doing anything and doesn't  remember meds helping him.  Wonders if he can get off the Wellbutrin Neuropsych testing is not suggesting dementia. Still chronic marital problems no better and hostile relationship.  Talking about separation.  Working to sell the house. Not highly motivated.  Knows he'd feel better separated from wife.  Can find things he enjoys at home.  Thinks he's underachieved in life.  Occ feels hopeless.  Lists of things he needs to do with constant sense of procrastination on things necessary to do. Plan: Wean Wellbutrin and start Trintellix 5 mg daily for 4 to 7 days and if there is no nausea then increase to 10 mg daily.  09/16/21 appt noted: Just increased Trintellix to 10 mg daily 2 days ago.  No SE noted. Off Wellbutrin. Signed contract to sell house by end of month and wife gone off deep-end and not functioning and is at home in bed.  She's having more emotional outbursts and "definitely hyped".  He thinks she's  hyper and talking constantly and more aggressive, slamming doors etc than usual. Overall he feels stronger and happier with change in meds.  He'd like to contrinue it.   Plan continue Trintellix 10 mg daily and Ritalin 10 mg 4 times daily  10/20/2021 appointment with the following noted: Trintellix working feeling stronger, lighter, generally more optimistic.  In complete contrast to wife.  I'm glad I'm on Trintellix. Satisfied with Ritalin.  Missed 4 days and was very tired.  On it 20 years. Overall satisfied with meds. Remains under high stress with wife and at home. No SE. No med changes  01/20/2022 appointment with the following noted: No Still on Trintellix 10 and Ritalin and making life better. No SE. Better mood has been maintained. No problems with sleep.  Using CPAP Satisfied with Ritalin.  Missed 4 days and was very tired.  On it 20 years. Still sings in group.    09/02/22 appt noted: Ongoing marital stress but maybe some progress.  Overall thinks she's still  depressed. This causes him to wrestle too but counseling creates some issues.  Joint sessions with Danice Goltz are helping.   Continues Trintellix 10 and Ritalin 10 QID. Compliant without a problem.   Continues singing. Normally 6-7 hours of sleep but even if 9 hours falls asleep reading. CPAP working properly.  Consistent with it. Mood about the same as Nov 2022.   No SE.   Plan: cont Trintellix 10 Start one half of 75 mg tablet Sunosi in the morning for 3 days, Then increase Sunosi to 175 mg tablet in the morning for 1 week, Then if needed increase Sunosi to 1-1/2 of the 75 mg tablets in the morning for 1 week, Then if needed increase Sunosi to the 150 mg tablet If it is helpful reduce Ritalin to 1/2 tablet 4 times daily for 2 week s, Then stop Ritalin  11/23/22 appt noted: Sunosi helpful and is more awake and it really works. More motivated to keep moving.  Gotten into habit or trying to go to bed reasonable hour unless gets stuck on computer on phone.  Can find endless distractions.  Situation is amplifying that. No SE. Still taking Ritalin. Tendency to be very withdrawn.   Chronic tension with wife and tends to avoid her bc her behavior.   Stilll using CPAP.   Seems to be anxious.  Like my world is collapsing .  Busy beating himself up on what he's not doing and W  jumps on board to be critical.   She is out of control with criticising him over trivial matters.  Criticism is unending.  Plan: continue Trintellix 10  Conintue bc benefit Sunosi to the 150 mg tablet Rec trial reduce Ritalin to 1/2 tablet 4 times daily for 2 week s, Then stop Ritalin.  He's getting from PCP Brunilda Payor.  02/21/23 appt noted: Got off Ritalin. For over a month.  A little shakey at first but got over it.  Had to stop abruptly Dt shortage and out of town.   I feel better psychologically off it.  Thinks he was a prisoner to Ritalin and Sunosi freed him from that.   Wonders about weaning off  Trintellix.  Trying to wean for sex activity if possible.   Some ED and tried them in the past. 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 Doing [employer] training and enjoying it a lot.  Teaching him a lot.  Disc risks weaning Trintellix and will start  after tax season. Anxiety is better than it was. Plan: Conintue bc benefit Sunosi to the 150 mg tablet Successful off Ritalin Okay to try to wean off Trintellix after tax season per his request.  Discussed the risk of relapse.  04/20/23 appt noted: Right now mood is good and feels satisfied with things except marriage.  Says even others , friends notice wife's anger problems.   When tried higher dose Trintellix didn't see benefit and had vague SE.  Seemed like it was too high.  But didn't stop it.   Current psych meds: Trintellix 10, sunosi 150 AM No current SE.   Overall feels more "stable" on Sunosi vs Ritalin.  Easier to take with less worrying about how it affects him.  Generally prefers to be on less med.   Has had low periods of depression .   Enjoys barbershop singing.  Active.   Plan no changes.  05/18/23 appt noted: Still doing well with meds.   Rare good night sleep.  Always feels he hasn't accomplished enough and has tendency to stay up too late at night doing tasks.   Past Psychiatric Medication Trials:  History [facility].  On and off antidepressants for 20 years  Doesn't remember any being helpful.  lexapro 10  Trintellix 10, 15 mg not more helpful and vague SE Remote Wellbutrin 450.  Doesn't remember the names of prior meds but remembers sexual SE with one. History of Nuvigil for hypersomnolence but he does not recall the response but apparently not good enough so Ritalin was RX in it's place Sunosi 150 benefit  Review of Systems:  Review of Systems  Constitutional:  Negative for fatigue.  Eyes:  Positive for visual disturbance.  Cardiovascular:  Negative for chest pain and palpitations.   Gastrointestinal:  Negative for nausea.  Musculoskeletal:  Positive for arthralgias.  Neurological:  Negative for tremors.  Psychiatric/Behavioral:  Positive for decreased concentration. Negative for dysphoric mood. The patient is not nervous/anxious.     Medications: I have reviewed the patient's current medications.  Current Outpatient Medications  Medication Sig Dispense Refill   ciclopirox (PENLAC) 8 % solution Apply topically at bedtime. Apply over nail and surrounding skin. Apply daily over previous coat. After seven (7) days, may remove with alcohol and continue cycle. 6.6 mL 2   CONTOUR NEXT TEST test strip      Cyanocobalamin (VITAMIN B 12) 500 MCG TABS Take 2,000 Units by mouth.     magnesium 30 MG tablet Take 30 mg by mouth 2 (two) times daily.     NOVOLOG 100 UNIT/ML injection U IN INSULIN PUMP .APPROXIMATELY 80 UNITS A DAY  6   pravastatin (PRAVACHOL) 10 MG tablet 10 mg. Taking 1/2 tab of 20mg      PROLENSA 0.07 % SOLN INT 1 GTT IN OS HS  6   ramipril (ALTACE) 5 MG capsule Take 5 mg by mouth 2 (two) times daily.     sildenafil (REVATIO) 20 MG tablet Take 20 mg by mouth as needed (take 2-5 tablets as needed for erections).     Solriamfetol HCl (SUNOSI) 150 MG TABS Take 1 tablet (150 mg total) by mouth every morning. 90 tablet 1   tadalafil (CIALIS) 20 MG tablet Take 1 tablet (20 mg total) by mouth daily as needed for erectile dysfunction. 15 tablet 1   terbinafine (LAMISIL) 250 MG tablet TAKE 1 TABLET(250 MG) BY MOUTH DAILY 90 tablet 0   vortioxetine HBr (TRINTELLIX) 10 MG TABS tablet Take 1 tablet (  10 mg total) by mouth daily. 90 tablet 1   No current facility-administered medications for this visit.    Medication Side Effects: None  Allergies: No Known Allergies  Past Medical History:  Diagnosis Date   Anxiety    Arthritis    Cancer (HCC)    skin CA removed   Depression    Diabetes mellitus    Diabetes mellitus without complication (HCC)    Type 1   Elevated  PSA 11/2014   PCP ordered for patient to take Cipro for 21 days   Erectile dysfunction    Hypercholesteremia    Skin cancer of face    Sleep apnea    wears CPAP nightly    Family History  Problem Relation Age of Onset   Cancer Father        Esophageal/gastric cancer   Cancer Mother        Metastatic cancer   Rheum arthritis Mother    Colon cancer Neg Hx    Pancreatic cancer Neg Hx    Stomach cancer Neg Hx     Social History   Socioeconomic History   Marital status: Married    Spouse name: Not on file   Number of children: 2   Years of education: Not on file   Highest education level: Not on file  Occupational History   Occupation: Retired    Associate Professor: [employer]  Tobacco Use   Smoking status: Former    Packs/day: 2.00    Years: 15.00    Additional pack years: 0.00    Total pack years: 30.00    Types: Cigarettes    Quit date: 1981    Years since quitting: 43.4   Smokeless tobacco: Never  Substance and Sexual Activity   Alcohol use: No    Comment: 1986   Drug use: No   Sexual activity: Not on file  Other Topics Concern   Not on file  Social History Narrative    Merged History Encounter        Social Determinants of Health   Financial Resource Strain: Not on file  Food Insecurity: Not on file  Transportation Needs: Not on file  Physical Activity: Not on file  Stress: Not on file  Social Connections: Not on file  Intimate Partner Violence: Not on file    Past Medical History, Surgical history, Social history, and Family history were reviewed and updated as appropriate.   Please see review of systems for further details on the patient's review from today.   Objective:   Physical Exam:  There were no vitals taken for this visit.  Physical Exam Constitutional:      General: He is not in acute distress. Musculoskeletal:        General: No deformity.  Neurological:     Mental Status: He is alert and oriented to person, place, and time.      Cranial Nerves: No dysarthria.     Coordination: Coordination normal.     Gait: Gait normal.  Psychiatric:        Attention and Perception: Attention and perception normal. He does not perceive auditory or visual hallucinations.        Mood and Affect: Mood is not anxious or depressed. Affect is not labile, blunt or inappropriate.        Speech: Speech normal.        Behavior: Behavior normal. Behavior is not slowed. Behavior is cooperative.        Thought Content:  Thought content normal. Thought content is not paranoid or delusional. Thought content does not include homicidal or suicidal ideation. Thought content does not include suicidal plan.        Cognition and Memory: Cognition and memory normal.        Judgment: Judgment normal.     Comments: Insight intact Sx much better and [employer] training helps.     Lab Review:     Component Value Date/Time   NA 139 12/02/2014 0842   K 4.3 12/02/2014 0842   CL 106 12/02/2014 0842   CO2 25 12/02/2014 0842   GLUCOSE 223 (H) 12/02/2014 0842   BUN 24 (H) 12/02/2014 0842   CREATININE 0.87 12/02/2014 0842   CALCIUM 8.8 12/02/2014 0842   GFRNONAA 88 (L) 12/02/2014 0842   GFRAA >90 12/02/2014 0842       Component Value Date/Time   WBC 4.9 12/02/2014 0843   RBC 4.90 12/02/2014 0843   HGB 14.8 12/02/2014 0843   HCT 44.1 12/02/2014 0843   PLT 226 12/02/2014 0843   MCV 90.0 12/02/2014 0843   MCH 30.2 12/02/2014 0843   MCHC 33.6 12/02/2014 0843   RDW 13.1 12/02/2014 0843    No results found for: "POCLITH", "LITHIUM"   No results found for: "PHENYTOIN", "PHENOBARB", "VALPROATE", "CBMZ"   .res Assessment: Plan:    Diagnoses and all orders for this visit:  Major depressive disorder, recurrent episode, moderate (HCC)  Generalized anxiety disorder  Sleep apnea with hypersomnolence  Early onset dysthymia  Obstructive sleep apnea  Marital conflict    30 min  We discussed When first seen he denied significant  depressive symptoms although his wife thought he was depressed.    He realizes he also needs therapy for this tendency to self sabotage and also for marital conflict that has resulted.  Ongoing conflict with his wife. Overall feels stronger and more confident. Sx much better and [employer] training helps.  Encourage activity in retirement.  Disc examples like classes, [facility].  Disc poss separation to diminish hostility in the relationship, which in turn might help his depression bc she is highly critical of him.  Disc concerns about wife who is a pt.  She's moving to fast and angry.    Disc sleep hygiene and need to get enough sleep.    Trintellix 10 mg daily.  Generally good response and no SE. Disc option of increasing the dose to see if more motivation and activity. Disc SE .   Advise against stopping bc recent 2 week depression but genreally he wants to be off meds if possible.  Disc pros and cons of stopping it.  "For now let's stick with what we're doing"  Hypersomnolence predates antidepressants and failed to respond to armodafinil but is improved though not resolved with Ritalin 10 mg 4 times daily but even better with Sunosi than Ritalin Conintue bc benefit Sunosi to the 150 mg tablet Successful off Ritalin  Cialis 10-20 mg prn.   Disc se and dosing range.  No med changes  Sober for years but decided to restart AA and finish what he's started and he thinks it's fantastic. Still enjoys that.  Has been in counseling.     After Visit Summary for patient specific instructions  FU 3-4 months  Meredith Staggers, MD, DFAPA   Future Appointments  Date Time Provider Department Center  07/19/2023  7:30 AM Sherrie George, MD [facility] None  08/22/2023  9:00 AM Cottle, Steva Ready., MD  [facility] None  10/24/2023  8:45 AM McCue, Shanda Bumps, NP [facility] None    No orders of the defined types were placed in this encounter.    -------------------------------

## 2023-05-19 DIAGNOSIS — F331 Major depressive disorder, recurrent, moderate: Secondary | ICD-10-CM | POA: Diagnosis not present

## 2023-06-02 DIAGNOSIS — F331 Major depressive disorder, recurrent, moderate: Secondary | ICD-10-CM | POA: Diagnosis not present

## 2023-06-06 ENCOUNTER — Telehealth: Payer: Self-pay | Admitting: Nutrition

## 2023-06-06 NOTE — Telephone Encounter (Signed)
LVM that we need to cancel pump training tomorrow due to no orders.  Left number to call me back.

## 2023-06-07 ENCOUNTER — Ambulatory Visit: Payer: Medicare Other | Admitting: Nutrition

## 2023-06-07 ENCOUNTER — Telehealth: Payer: Self-pay | Admitting: Nutrition

## 2023-06-07 NOTE — Telephone Encounter (Signed)
LVM X2 that I need a referral and MD orders.  Also need clarification of which doctor to get the orders from.  Also told him that I contacted the [employer] rep. To get permission from her for the training and paperwork needed to try the patient. Telephone number given to call me back.

## 2023-06-08 ENCOUNTER — Ambulatory Visit: Payer: Medicare Other | Admitting: Nutrition

## 2023-06-14 ENCOUNTER — Telehealth: Payer: Self-pay | Admitting: Nutrition

## 2023-06-14 NOTE — Telephone Encounter (Signed)
Another message left to call me to schedule this training with times I will be available to answer the phone.

## 2023-06-14 NOTE — Telephone Encounter (Signed)
Pt. Has left 4 more messages to schedule pump training.  Have called [employer] today and phone is going to voice mail.  Lvm that I need him to call me to get pump start orders.  One of his messages says his dr. Has faxed orders over, but no fax has been received.

## 2023-06-16 DIAGNOSIS — M1712 Unilateral primary osteoarthritis, left knee: Secondary | ICD-10-CM | POA: Diagnosis not present

## 2023-06-24 DIAGNOSIS — Z4681 Encounter for fitting and adjustment of insulin pump: Secondary | ICD-10-CM | POA: Diagnosis not present

## 2023-06-24 DIAGNOSIS — E785 Hyperlipidemia, unspecified: Secondary | ICD-10-CM | POA: Diagnosis not present

## 2023-06-24 DIAGNOSIS — Z794 Long term (current) use of insulin: Secondary | ICD-10-CM | POA: Diagnosis not present

## 2023-06-24 DIAGNOSIS — E1040 Type 1 diabetes mellitus with diabetic neuropathy, unspecified: Secondary | ICD-10-CM | POA: Diagnosis not present

## 2023-06-24 DIAGNOSIS — I10 Essential (primary) hypertension: Secondary | ICD-10-CM | POA: Diagnosis not present

## 2023-07-07 DIAGNOSIS — Z4681 Encounter for fitting and adjustment of insulin pump: Secondary | ICD-10-CM | POA: Diagnosis not present

## 2023-07-07 DIAGNOSIS — E785 Hyperlipidemia, unspecified: Secondary | ICD-10-CM | POA: Diagnosis not present

## 2023-07-07 DIAGNOSIS — E1040 Type 1 diabetes mellitus with diabetic neuropathy, unspecified: Secondary | ICD-10-CM | POA: Diagnosis not present

## 2023-07-07 DIAGNOSIS — Z794 Long term (current) use of insulin: Secondary | ICD-10-CM | POA: Diagnosis not present

## 2023-07-07 DIAGNOSIS — I10 Essential (primary) hypertension: Secondary | ICD-10-CM | POA: Diagnosis not present

## 2023-07-19 ENCOUNTER — Encounter (INDEPENDENT_AMBULATORY_CARE_PROVIDER_SITE_OTHER): Payer: Federal, State, Local not specified - PPO | Admitting: Ophthalmology

## 2023-07-19 DIAGNOSIS — E113591 Type 2 diabetes mellitus with proliferative diabetic retinopathy without macular edema, right eye: Secondary | ICD-10-CM | POA: Diagnosis not present

## 2023-07-19 DIAGNOSIS — H35033 Hypertensive retinopathy, bilateral: Secondary | ICD-10-CM

## 2023-07-19 DIAGNOSIS — E113512 Type 2 diabetes mellitus with proliferative diabetic retinopathy with macular edema, left eye: Secondary | ICD-10-CM

## 2023-07-19 DIAGNOSIS — H34812 Central retinal vein occlusion, left eye, with macular edema: Secondary | ICD-10-CM | POA: Diagnosis not present

## 2023-07-19 DIAGNOSIS — H43813 Vitreous degeneration, bilateral: Secondary | ICD-10-CM | POA: Diagnosis not present

## 2023-07-19 DIAGNOSIS — Z794 Long term (current) use of insulin: Secondary | ICD-10-CM

## 2023-07-19 DIAGNOSIS — I10 Essential (primary) hypertension: Secondary | ICD-10-CM | POA: Diagnosis not present

## 2023-07-20 ENCOUNTER — Other Ambulatory Visit: Payer: Self-pay | Admitting: Psychiatry

## 2023-08-11 ENCOUNTER — Other Ambulatory Visit: Payer: Self-pay | Admitting: Psychiatry

## 2023-08-11 DIAGNOSIS — G4710 Hypersomnia, unspecified: Secondary | ICD-10-CM

## 2023-08-11 NOTE — Telephone Encounter (Signed)
LF 6/19, due 9/17 - mail order

## 2023-08-22 ENCOUNTER — Encounter: Payer: Self-pay | Admitting: Psychiatry

## 2023-08-22 ENCOUNTER — Ambulatory Visit (INDEPENDENT_AMBULATORY_CARE_PROVIDER_SITE_OTHER): Payer: Medicare Other | Admitting: Psychiatry

## 2023-08-22 DIAGNOSIS — G4730 Sleep apnea, unspecified: Secondary | ICD-10-CM | POA: Diagnosis not present

## 2023-08-22 DIAGNOSIS — F411 Generalized anxiety disorder: Secondary | ICD-10-CM

## 2023-08-22 DIAGNOSIS — F341 Dysthymic disorder: Secondary | ICD-10-CM

## 2023-08-22 DIAGNOSIS — F331 Major depressive disorder, recurrent, moderate: Secondary | ICD-10-CM | POA: Diagnosis not present

## 2023-08-22 DIAGNOSIS — G4710 Hypersomnia, unspecified: Secondary | ICD-10-CM | POA: Diagnosis not present

## 2023-08-22 DIAGNOSIS — Z630 Problems in relationship with spouse or partner: Secondary | ICD-10-CM

## 2023-08-22 NOTE — Progress Notes (Signed)
Jermaine Waters [id number] 1946-03-01 77 y.o.   Subjective:   Patient ID:  Jermaine Waters is a 77 y.o. (DOB 1946-04-10) male.  Chief Complaint:  Chief Complaint  Patient presents with   Follow-up   Anxiety   Sleeping Problem   Depression    Depression        Associated symptoms include decreased concentration and fatigue.  Past medical history includes anxiety.   Anxiety Symptoms include decreased concentration and nervous/anxious behavior. Patient reports no chest pain, nausea or palpitations.     Jermaine Waters presents to the office today for follow-up of mood concerns and hypersomnolence.    Patient was seen August 08, 2019.  The assessment was depression.  The following was done: STOP  bupropion 75 mg. Start bupropion XL 150 mg tablets 1 each morning for 2 weeks. If no side effects such as jitteriness, then increase to 2 tablets each morning which equals 300 mg daily. He continued Ritalin 10 QID.  Patient was seen October 10, 2019.  He had a partial response with Wellbutrin XL 300 mg daily.  He was tolerating it and therefore the dosage was increased to 450 mg a day. He continued Ritalin 10 mg 4 times daily No Covid problems.   Didn't notice much difference with increase Wellbutrin.  Wife thinks he's a little more lethargic.  In some ways a little happier but really struggling with wife.  Pending temporary separation for a few months to try to provide some healing.  Hopefully will help both of them.   Wife concerned about Ritalin Rx for alertness DT OSA and central apnea that even affects him while awake.  PT meds could fall asleep driving.  Ritalin helps and CPAP helps.  Helps alerness and no insomnia.  Wonders about SE of it. A little more optimistic and less tense.  Therapy helpng also. No SE.  Not jitttery nor edgy.  Wife critical that he's less responsive, but maybe I'm less on edge. Energy is fine.  Interests hard to answer bc not a lot of outside activities left DT  Covid.  Couple of small groups meeting with Zoom.  Most of free time before was singing but can't do it now. Things changed and wants meds and therapy.  PCP started Wellbutrin a couple of weeks ago. All my life self-sabotage with jobs and has to restart.  Fear of failure.  Some worsening health issues dropping productivity.  Wife frustrated with him and constantly advising and critical.  Marital stress and unhappiness worse bc together all the time. Plan because of residual depression consideration was given to augment with Abilify.  He was in the process of moving and he felt that perhaps after the move he might have a different view and he wanted to defer.  So no meds were changed.  Appointment Apr 07, 2020, the following was noted: He did get moved.  Still having conflict with his wife which is a chronic stress. Pt reports that mood is Anxious, Depressed and Dysphoric and describes depression and anxiety as mild or mild to moderate but overall improved since last visit.. Anxiety symptoms include: Excessive Worry, avoidant of stress or pressure or commitments. . Pt reports no sleep issues and 7 hours but prefers 8 hours. Pt reports that appetite is good. Pt reports that energy is poor and anhedonia, poor motivation and withdrawn from usual activities. Concentration is down slightly. Suicidal thoughts:  denied by patient. Plan:  No med changes  05/21/2020 appointment the following is noted:  Urgent appt at his request. One concern is more anxiety and procrastinates.Jermaine Waters if he's working on the wrong problem.  Jermaine Waters of everything.  ? Fear of failure.  Easily stressed by small demands.  Stress averse and not operating at full capacity. No history of anxiety meds.   Counseling. Ritalin 10 QID for hypersomnolence but questions ADD bc is distractible.  Sleep apnea for 20 years on CPAP.  Chronically sleepy. Plan: no med changes:  08/26/2020 appointment with the following noted: Increased Wellbutrin  to 450 mg has worked fine. Feeling great.  Only negative is sleepy if sits down to read. Sleepy all his life and long term CPAP user.  Started class 4 year [place]. No trouble with [place] since July 20 and no anxiety while she's gone.   Plan: Doing well.  No med changes  12/31/2020 appointment with following noted: Wife joined per patient request Overall OK but somewhat lifeless and disconnected and wonders if med related or not and if changes can be made. Otherwise not anxious.  Not joyful.   Wife Jermaine Waters has different perspective in some areas.  She thinks he's too inactive.  She says since retirement he lacks drive to do much.  Rare exercise. Plan: Reduce the Lexapro to 5 mg nightly to see if it is having a dulling effect   02/25/2021 appointment with the following noted: No difference noted with less Lexapro.  Overall sort of cloudy and in my own world.  Wife CO his memory issues.  Depressed, frustrated, overwhelmed and probably worse after the reduction in meds. Caring for wife with TKR.  Stressful relationship very. Does go out 3-4 nights per week with various groups and some church activity.  Plan: DC Lexapro and start duloxetine 30 mg for a week then 60 mg daily. Continue Wellbutrin XL 450 mg daily Continue Ritalin 10 mg 4 times daily  06/01/2021 appointment with the following noted: A little more irritable.  Esp with his wife.  Wife complains about him.  He's afraid of saying something that upsets her. Chronic sleepiness is worse he thinks.  Thinks it's med related. Depression is unchanged.   Plan: Wean duloxetine using 20 mg capsules to 40 mg daily for 2 weeks then 20 mg daily for 2 weeks then stop it in order to prevent withdrawal symptoms.  He will pay attention to see if irritability and alertness are improved. Continue Wellbutrin XL 450 mg daily Continue Ritalin 10 mg tablets 4 times daily  08/20/2021 appointment with the following noted: At times doesn't think  any meds are doing anything and doesn't remember meds helping him.  Wonders if he can get off the Wellbutrin Neuropsych testing is not suggesting dementia. Still chronic marital problems no better and hostile relationship.  Talking about separation.  Working to sell the house. Not highly motivated.  Knows he'd feel better separated from wife.  Can find things he enjoys at home.  Thinks he's underachieved in life.  Occ feels hopeless.  Lists of things he needs to do with constant sense of procrastination on things necessary to do. Plan: Wean Wellbutrin and start Trintellix 5 mg daily for 4 to 7 days and if there is no nausea then increase to 10 mg daily.  09/16/21 appt noted: Just increased Trintellix to 10 mg daily 2 days ago.  No SE noted. Off Wellbutrin. Signed contract to sell house by end of month and wife gone off deep-end and not functioning and is at home in bed.  She's having more emotional outbursts and "definitely hyped".  He thinks she's hyper and talking constantly and more aggressive, slamming doors etc than usual. Overall he feels stronger and happier with change in meds.  He'd like to contrinue it.   Plan continue Trintellix 10 mg daily and Ritalin 10 mg 4 times daily  10/20/2021 appointment with the following noted: Trintellix working feeling stronger, lighter, generally more optimistic.  In complete contrast to wife.  I'm glad I'm on Trintellix. Satisfied with Ritalin.  Missed 4 days and was very tired.  On it 20 years. Overall satisfied with meds. Remains under high stress with wife and at home. No SE. No med changes  01/20/2022 appointment with the following noted: No Still on Trintellix 10 and Ritalin and making life better. No SE. Better mood has been maintained. No problems with sleep.  Using CPAP Satisfied with Ritalin.  Missed 4 days and was very tired.  On it 20 years. Still sings in group.    09/02/22 appt noted: Ongoing marital stress but maybe some progress.   Overall thinks she's still depressed. This causes him to wrestle too but counseling creates some issues.  Joint sessions with Jermaine Waters are helping.   Continues Trintellix 10 and Ritalin 10 QID. Compliant without a problem.   Continues singing. Normally 6-7 hours of sleep but even if 9 hours falls asleep reading. CPAP working properly.  Consistent with it. Mood about the same as Nov 2022.   No SE.   Plan: cont Trintellix 10 Start one half of 75 mg tablet Sunosi in the morning for 3 days, Then increase Sunosi to 175 mg tablet in the morning for 1 week, Then if needed increase Sunosi to 1-1/2 of the 75 mg tablets in the morning for 1 week, Then if needed increase Sunosi to the 150 mg tablet If it is helpful reduce Ritalin to 1/2 tablet 4 times daily for 2 week s, Then stop Ritalin  11/23/22 appt noted: Sunosi helpful and is more awake and it really works. More motivated to keep moving.  Gotten into habit or trying to go to bed reasonable hour unless gets stuck on computer on phone.  Can find endless distractions.  Situation is amplifying that. No SE. Still taking Ritalin. Tendency to be very withdrawn.   Chronic tension with wife and tends to avoid her bc her behavior.   Stilll using CPAP.   Seems to be anxious.  Like my world is collapsing .  Busy beating himself up on what he's not doing and W  jumps on board to be critical.   She is out of control with criticising him over trivial matters.  Criticism is unending.  Plan: continue Trintellix 10  Conintue bc benefit Sunosi to the 150 mg tablet Rec trial reduce Ritalin to 1/2 tablet 4 times daily for 2 week s, Then stop Ritalin.  He's getting from PCP Jermaine Waters.  02/21/23 appt noted: Got off Ritalin. For over a month.  A little shakey at first but got over it.  Had to stop abruptly Dt shortage and out of town.   I feel better psychologically off it.  Thinks he was a prisoner to Ritalin and Sunosi freed him from that.   Wonders  about weaning off Trintellix.  Trying to wean for sex activity if possible.   Some ED and tried them in the past. 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 Doing [employer] training and enjoying it a lot.  Teaching him a lot.  Disc risks weaning Trintellix and will start after tax season. Anxiety is better than it was. Plan: Conintue bc benefit Sunosi to the 150 mg tablet Successful off Ritalin Okay to try to wean off Trintellix after tax season per his request.  Discussed the risk of relapse.  04/20/23 appt noted: Right now mood is good and feels satisfied with things except marriage.  Says even others , friends notice wife's anger problems.   When tried higher dose Trintellix didn't see benefit and had vague SE.  Seemed like it was too high.  But didn't stop it.   Current psych meds: Trintellix 10, sunosi 150 AM No current SE.   Overall feels more "stable" on Sunosi vs Ritalin.  Easier to take with less worrying about how it affects him.  Generally prefers to be on less med.   Has had low periods of depression .   Enjoys barbershop singing.  Active.   Plan no changes.  05/18/23 appt noted: Still doing well with meds.   Rare good night sleep.  Always feels he hasn't accomplished enough and has tendency to stay up too late at night doing tasks. Plan: no med changes  08/19/23 appt noted: Generally pretty good.  Not markedly dep.    Meds as above.  Feels they are both helping. Looked up SE Wonders if [employer] causing anxiety.  It is helping some but not resolved things.  Doesn't want to return to [facility] bc SE Has central and Obstructive sleep apnea.  Uses CPAP Can still fall asleep in day even with good night's sleep.  Will have apneic events napping in the chair.   Volunteering more and very busy.  Started [employer] and active.   Going well.   Home life no better.  Wife critical and negative. Wife says he's too easily distracted.  Like if doing task and goes  by TV then gets distracted by the TV.  Past Psychiatric Medication Trials:  History [facility].  On and off antidepressants for 20 years  Doesn't remember any being helpful.  lexapro 10  Trintellix 10, 15 mg not more helpful and vague SE Remote Wellbutrin 450.  Doesn't remember the names of prior meds but remembers sexual SE with one. History of Nuvigil for hypersomnolence but he does not recall the response but apparently not good enough  so Ritalin was RX in it's place Sunosi 150 benefit  Review of Systems:  Review of Systems  Constitutional:  Positive for fatigue.  Eyes:  Positive for visual disturbance.  Cardiovascular:  Negative for chest pain and palpitations.  Gastrointestinal:  Negative for nausea.  Musculoskeletal:  Positive for arthralgias.  Neurological:  Negative for tremors.  Psychiatric/Behavioral:  Positive for decreased concentration and depression. Negative for dysphoric mood. The patient is nervous/anxious.     Medications: I have reviewed the patient's current medications.  Current Outpatient Medications  Medication Sig Dispense Refill   ciclopirox (PENLAC) 8 % solution Apply topically at bedtime. Apply over nail and surrounding skin. Apply daily over previous coat. After seven (7) days, may remove with alcohol and continue cycle. 6.6 mL 2   CONTOUR NEXT TEST test strip      NOVOLOG 100 UNIT/ML injection U IN INSULIN PUMP .APPROXIMATELY 80 UNITS A DAY  6   PROLENSA 0.07 % SOLN INT 1 GTT IN OS HS  6   ramipril (ALTACE) 5 MG capsule Take 5 mg by mouth 2 (two) times daily.  SUNOSI 150 MG TABS TAKE 1 TABLET EVERY MORNING 90 tablet 0   tadalafil (CIALIS) 20 MG tablet TAKE 1 TABLET(20 MG) BY MOUTH DAILY AS NEEDED FOR ERECTILE DYSFUNCTION 15 tablet 1   terbinafine (LAMISIL) 250 MG tablet TAKE 1 TABLET(250 MG) BY MOUTH DAILY 90 tablet 0   vortioxetine HBr (TRINTELLIX) 10 MG TABS tablet Take 1 tablet (10 mg total) by mouth daily. 90 tablet 1   Cyanocobalamin  (VITAMIN B 12) 500 MCG TABS Take 2,000 Units by mouth.     magnesium 30 MG tablet Take 30 mg by mouth 2 (two) times daily.     No current facility-administered medications for this visit.    Medication Side Effects: None  Allergies: No Known Allergies  Past Medical History:  Diagnosis Date   Anxiety    Arthritis    Cancer (HCC)    skin CA removed   Depression    Diabetes mellitus    Diabetes mellitus without complication (HCC)    Type 1   Elevated PSA 11/2014   PCP ordered for patient to take Cipro for 21 days   Erectile dysfunction    Hypercholesteremia    Skin cancer of face    Sleep apnea    wears CPAP nightly    Family History  Problem Relation Age of Onset   Cancer Father        Esophageal/gastric cancer   Cancer Mother        Metastatic cancer   Rheum arthritis Mother    Colon cancer Neg Hx    Pancreatic cancer Neg Hx    Stomach cancer Neg Hx     Social History   Socioeconomic History   Marital status: Married    Spouse name: Not on file   Number of children: 2   Years of education: Not on file   Highest education level: Not on file  Occupational History   Occupation: Retired    Associate Professor: [employer]  Tobacco Use   Smoking status: Former    Current packs/day: 0.00    Average packs/day: 2.0 packs/day for 15.0 years (30.0 ttl pk-yrs)    Types: Cigarettes    Start date: 1966    Quit date: 85    Years since quitting: 43.7   Smokeless tobacco: Never  Substance and Sexual Activity   Alcohol use: No    Comment: 1986   Drug use: No   Sexual activity: Not on file  Other Topics Concern   Not on file  Social History Narrative    Merged History Encounter        Social Determinants of Health   Financial Resource Strain: Not on file  Food Insecurity: Not on file  Transportation Needs: Not on file  Physical Activity: Not on file  Stress: Not on file  Social Connections: Not on file  Intimate Partner Violence: Not on file    Past Medical  History, Surgical history, Social history, and Family history were reviewed and updated as appropriate.   Please see review of systems for further details on the patient's review from today.   Objective:   Physical Exam:  There were no vitals taken for this visit.  Physical Exam Constitutional:      General: He is not in acute distress. Musculoskeletal:        General: No deformity.  Neurological:     Mental Status: He is alert and oriented to person, place, and time.     Cranial Nerves: No dysarthria.  Coordination: Coordination normal.     Gait: Gait normal.  Psychiatric:        Attention and Perception: Attention and perception normal. He does not perceive auditory or visual hallucinations.        Mood and Affect: Mood is anxious. Mood is not depressed. Affect is not labile, blunt or inappropriate.        Speech: Speech normal.        Behavior: Behavior normal. Behavior is not slowed. Behavior is cooperative.        Thought Content: Thought content normal. Thought content is not paranoid or delusional. Thought content does not include homicidal or suicidal ideation. Thought content does not include suicidal plan.        Cognition and Memory: Cognition and memory normal.        Judgment: Judgment normal.     Comments: Insight intact Sx much better and Jermaine Waters's ministry work helps. But residual anxiety     Lab Review:     Component Value Date/Time   NA 139 12/02/2014 0842   K 4.3 12/02/2014 0842   CL 106 12/02/2014 0842   CO2 25 12/02/2014 0842   GLUCOSE 223 (H) 12/02/2014 0842   BUN 24 (H) 12/02/2014 0842   CREATININE 0.87 12/02/2014 0842   CALCIUM 8.8 12/02/2014 0842   GFRNONAA 88 (L) 12/02/2014 0842   GFRAA >90 12/02/2014 0842       Component Value Date/Time   WBC 4.9 12/02/2014 0843   RBC 4.90 12/02/2014 0843   HGB 14.8 12/02/2014 0843   HCT 44.1 12/02/2014 0843   PLT 226 12/02/2014 0843   MCV 90.0 12/02/2014 0843   MCH 30.2 12/02/2014 0843   MCHC  33.6 12/02/2014 0843   RDW 13.1 12/02/2014 0843    No results found for: "POCLITH", "LITHIUM"   No results found for: "PHENYTOIN", "PHENOBARB", "VALPROATE", "CBMZ"   .res Assessment: Plan:    Amillio "Jermaine Waters" was seen today for follow-up, anxiety, sleeping problem and depression.  Diagnoses and all orders for this visit:  Major depressive disorder, recurrent episode, moderate (HCC)  Generalized anxiety disorder  Sleep apnea with hypersomnolence  Early onset dysthymia  Marital conflict     30 min  We discussed When first seen he denied significant depressive symptoms although his wife thought he was depressed.    He realizes he also needs therapy for this tendency to self sabotage and also for marital conflict that has resulted.  Ongoing conflict with his wife. Overall feels stronger and more confident. Sx much better and [employer] training helps.  Encourage activity in retirement.  Disc examples like classes, [facility].  Disc poss separation to diminish hostility in the relationship, which in turn might help his depression bc she is highly critical of him.  Disc concerns about wife who is a pt.  She's moving to fast and angry.    Disc sleep hygiene and need to get enough sleep.    Trintellix 10 mg daily.  Generally good response and no SE. Disc option of increasing the dose to see if more motivation and activity. Disc SE .   Advise against stopping bc recent 2 week depression but genreally he wants to be off meds if possible.  Disc pros and cons of stopping it.  "For now let's stick with what we're doing"  Hypersomnolence predates antidepressants and failed to respond to armodafinil but is improved though not resolved with Ritalin 10 mg 4 times daily but even better with Sunosi than  Ritalin Continue bc benefit Sunosi to the 150 mg tablet BUT reduce to 112.5 mg to see if anxiety is better See if anxiety is better or not and then reevaluate wife's concerns  about is inattention.   Cialis 10-20 mg prn.   Disc se and dosing range.  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 Still enjoys that.  Has been in counseling.     After Visit Summary for patient specific instructions  FU 3-4 months  Meredith Staggers, MD, DFAPA   Future Appointments  Date Time Provider Department Center  09/27/2023  7:40 AM Sherrie George, MD [facility] None  10/24/2023  8:45 AM Ihor Austin, NP [facility] None    No orders of the defined types were placed in this encounter.    -------------------------------

## 2023-09-27 ENCOUNTER — Encounter (INDEPENDENT_AMBULATORY_CARE_PROVIDER_SITE_OTHER): Payer: Federal, State, Local not specified - PPO | Admitting: Ophthalmology

## 2023-09-27 DIAGNOSIS — H43813 Vitreous degeneration, bilateral: Secondary | ICD-10-CM | POA: Diagnosis not present

## 2023-09-27 DIAGNOSIS — E113513 Type 2 diabetes mellitus with proliferative diabetic retinopathy with macular edema, bilateral: Secondary | ICD-10-CM

## 2023-09-27 DIAGNOSIS — Z794 Long term (current) use of insulin: Secondary | ICD-10-CM

## 2023-09-27 DIAGNOSIS — H35033 Hypertensive retinopathy, bilateral: Secondary | ICD-10-CM

## 2023-09-27 DIAGNOSIS — I10 Essential (primary) hypertension: Secondary | ICD-10-CM

## 2023-09-27 DIAGNOSIS — H34812 Central retinal vein occlusion, left eye, with macular edema: Secondary | ICD-10-CM

## 2023-09-29 DIAGNOSIS — E1040 Type 1 diabetes mellitus with diabetic neuropathy, unspecified: Secondary | ICD-10-CM | POA: Diagnosis not present

## 2023-09-29 DIAGNOSIS — I10 Essential (primary) hypertension: Secondary | ICD-10-CM | POA: Diagnosis not present

## 2023-09-29 DIAGNOSIS — Z4681 Encounter for fitting and adjustment of insulin pump: Secondary | ICD-10-CM | POA: Diagnosis not present

## 2023-09-29 DIAGNOSIS — Z23 Encounter for immunization: Secondary | ICD-10-CM | POA: Diagnosis not present

## 2023-09-29 DIAGNOSIS — E785 Hyperlipidemia, unspecified: Secondary | ICD-10-CM | POA: Diagnosis not present

## 2023-09-29 DIAGNOSIS — G720 Drug-induced myopathy: Secondary | ICD-10-CM | POA: Diagnosis not present

## 2023-09-29 DIAGNOSIS — Z794 Long term (current) use of insulin: Secondary | ICD-10-CM | POA: Diagnosis not present

## 2023-10-06 ENCOUNTER — Ambulatory Visit (INDEPENDENT_AMBULATORY_CARE_PROVIDER_SITE_OTHER): Payer: Medicare Other | Admitting: Psychiatry

## 2023-10-06 ENCOUNTER — Encounter: Payer: Self-pay | Admitting: Psychiatry

## 2023-10-06 DIAGNOSIS — G4710 Hypersomnia, unspecified: Secondary | ICD-10-CM

## 2023-10-06 DIAGNOSIS — G4730 Sleep apnea, unspecified: Secondary | ICD-10-CM

## 2023-10-06 DIAGNOSIS — R972 Elevated prostate specific antigen [PSA]: Secondary | ICD-10-CM | POA: Diagnosis not present

## 2023-10-06 DIAGNOSIS — G4733 Obstructive sleep apnea (adult) (pediatric): Secondary | ICD-10-CM | POA: Diagnosis not present

## 2023-10-06 DIAGNOSIS — F411 Generalized anxiety disorder: Secondary | ICD-10-CM

## 2023-10-06 DIAGNOSIS — F331 Major depressive disorder, recurrent, moderate: Secondary | ICD-10-CM

## 2023-10-06 DIAGNOSIS — E785 Hyperlipidemia, unspecified: Secondary | ICD-10-CM | POA: Diagnosis not present

## 2023-10-06 DIAGNOSIS — I10 Essential (primary) hypertension: Secondary | ICD-10-CM | POA: Diagnosis not present

## 2023-10-06 DIAGNOSIS — E109 Type 1 diabetes mellitus without complications: Secondary | ICD-10-CM | POA: Diagnosis not present

## 2023-10-06 NOTE — Progress Notes (Signed)
Jermaine Waters [id number] June 06, 1946 77 y.o.   Subjective:   Patient ID:  Jermaine Waters is a 77 y.o. (DOB Nov 17, 1946) male.  Chief Complaint:  Chief Complaint  Patient presents with   Follow-up    Several dxes, alertness, meds    Depression        Associated symptoms include decreased concentration and fatigue.  Past medical history includes anxiety.   Anxiety Symptoms include decreased concentration and nervous/anxious behavior. Patient reports no chest pain, nausea or palpitations.     Dwanye Waters presents to the office today for follow-up of mood concerns and hypersomnolence.    Patient was seen August 08, 2019.  The assessment was depression.  The following was done: STOP  bupropion 75 mg. Start bupropion XL 150 mg tablets 1 each morning for 2 weeks. If no side effects such as jitteriness, then increase to 2 tablets each morning which equals 300 mg daily. He continued Ritalin 10 QID.  Patient was seen October 10, 2019.  He had a partial response with Wellbutrin XL 300 mg daily.  He was tolerating it and therefore the dosage was increased to 450 mg a day. He continued Ritalin 10 mg 4 times daily No Covid problems.   Didn't notice much difference with increase Wellbutrin.  Wife thinks he's a little more lethargic.  In some ways a little happier but really struggling with wife.  Pending temporary separation for a few months to try to provide some healing.  Hopefully will help both of them.   Wife concerned about Ritalin Rx for alertness DT OSA and central apnea that even affects him while awake.  PT meds could fall asleep driving.  Ritalin helps and CPAP helps.  Helps alerness and no insomnia.  Wonders about SE of it. A little more optimistic and less tense.  Therapy helpng also. No SE.  Not jitttery nor edgy.  Wife critical that he's less responsive, but maybe I'm less on edge. Energy is fine.  Interests hard to answer bc not a lot of outside activities left DT Covid.   Couple of small groups meeting with Zoom.  Most of free time before was singing but can't do it now. Things changed and wants meds and therapy.  PCP started Wellbutrin a couple of weeks ago. All my life self-sabotage with jobs and has to restart.  Fear of failure.  Some worsening health issues dropping productivity.  Wife frustrated with him and constantly advising and critical.  Marital stress and unhappiness worse bc together all the time. Plan because of residual depression consideration was given to augment with Abilify.  He was in the process of moving and he felt that perhaps after the move he might have a different view and he wanted to defer.  So no meds were changed.  Appointment Apr 07, 2020, the following was noted: He did get moved.  Still having conflict with his wife which is a chronic stress. Pt reports that mood is Anxious, Depressed and Dysphoric and describes depression and anxiety as mild or mild to moderate but overall improved since last visit.. Anxiety symptoms include: Excessive Worry, avoidant of stress or pressure or commitments. . Pt reports no sleep issues and 7 hours but prefers 8 hours. Pt reports that appetite is good. Pt reports that energy is poor and anhedonia, poor motivation and withdrawn from usual activities. Concentration is down slightly. Suicidal thoughts:  denied by patient. Plan:  No med changes  05/21/2020 appointment the following is noted: Urgent appt at  his request. One concern is more anxiety and procrastinates.[name] if he's working on the wrong problem.  [name] of everything.  ? Fear of failure.  Easily stressed by small demands.  Stress averse and not operating at full capacity. No history of anxiety meds.   Counseling. Ritalin 10 QID for hypersomnolence but questions ADD bc is distractible.  Sleep apnea for 20 years on CPAP.  Chronically sleepy. Plan: no med changes:  08/26/2020 appointment with the following noted: Increased Wellbutrin to 450 mg  has worked fine. Feeling great.  Only negative is sleepy if sits down to read. Sleepy all his life and long term CPAP user.  Started class 4 year [place]. No trouble with [place] since July 20 and no anxiety while she's gone.   Plan: Doing well.  No med changes  12/31/2020 appointment with following noted: Wife joined per patient request Overall OK but somewhat lifeless and disconnected and wonders if med related or not and if changes can be made. Otherwise not anxious.  Not joyful.   Wife [name] has different perspective in some areas.  She thinks he's too inactive.  She says since retirement he lacks drive to do much.  Rare exercise. Plan: Reduce the Lexapro to 5 mg nightly to see if it is having a dulling effect   02/25/2021 appointment with the following noted: No difference noted with less Lexapro.  Overall sort of cloudy and in my own world.  Wife CO his memory issues.  Depressed, frustrated, overwhelmed and probably worse after the reduction in meds. Caring for wife with TKR.  Stressful relationship very. Does go out 3-4 nights per week with various groups and some church activity.  Plan: DC Lexapro and start duloxetine 30 mg for a week then 60 mg daily. Continue Wellbutrin XL 450 mg daily Continue Ritalin 10 mg 4 times daily  06/01/2021 appointment with the following noted: A little more irritable.  Esp with his wife.  Wife complains about him.  He's afraid of saying something that upsets her. Chronic sleepiness is worse he thinks.  Thinks it's med related. Depression is unchanged.   Plan: Wean duloxetine using 20 mg capsules to 40 mg daily for 2 weeks then 20 mg daily for 2 weeks then stop it in order to prevent withdrawal symptoms.  He will pay attention to see if irritability and alertness are improved. Continue Wellbutrin XL 450 mg daily Continue Ritalin 10 mg tablets 4 times daily  08/20/2021 appointment with the following noted: At times doesn't think any meds  are doing anything and doesn't remember meds helping him.  Wonders if he can get off the Wellbutrin Neuropsych testing is not suggesting dementia. Still chronic marital problems no better and hostile relationship.  Talking about separation.  Working to sell the house. Not highly motivated.  Knows he'd feel better separated from wife.  Can find things he enjoys at home.  Thinks he's underachieved in life.  Occ feels hopeless.  Lists of things he needs to do with constant sense of procrastination on things necessary to do. Plan: Wean Wellbutrin and start Trintellix 5 mg daily for 4 to 7 days and if there is no nausea then increase to 10 mg daily.  09/16/21 appt noted: Just increased Trintellix to 10 mg daily 2 days ago.  No SE noted. Off Wellbutrin. Signed contract to sell house by end of month and wife gone off deep-end and not functioning and is at home in bed.  She's having  more emotional outbursts and "definitely hyped".  He thinks she's hyper and talking constantly and more aggressive, slamming doors etc than usual. Overall he feels stronger and happier with change in meds.  He'd like to contrinue it.   Plan continue Trintellix 10 mg daily and Ritalin 10 mg 4 times daily  10/20/2021 appointment with the following noted: Trintellix working feeling stronger, lighter, generally more optimistic.  In complete contrast to wife.  I'm glad I'm on Trintellix. Satisfied with Ritalin.  Missed 4 days and was very tired.  On it 20 years. Overall satisfied with meds. Remains under high stress with wife and at home. No SE. No med changes  01/20/2022 appointment with the following noted: No Still on Trintellix 10 and Ritalin and making life better. No SE. Better mood has been maintained. No problems with sleep.  Using CPAP Satisfied with Ritalin.  Missed 4 days and was very tired.  On it 20 years. Still sings in group.    09/02/22 appt noted: Ongoing marital stress but maybe some progress.  Overall  thinks she's still depressed. This causes him to wrestle too but counseling creates some issues.  Joint sessions with Danice Goltz are helping.   Continues Trintellix 10 and Ritalin 10 QID. Compliant without a problem.   Continues singing. Normally 6-7 hours of sleep but even if 9 hours falls asleep reading. CPAP working properly.  Consistent with it. Mood about the same as Nov 2022.   No SE.   Plan: cont Trintellix 10 Start one half of 75 mg tablet Sunosi in the morning for 3 days, Then increase Sunosi to 175 mg tablet in the morning for 1 week, Then if needed increase Sunosi to 1-1/2 of the 75 mg tablets in the morning for 1 week, Then if needed increase Sunosi to the 150 mg tablet If it is helpful reduce Ritalin to 1/2 tablet 4 times daily for 2 week s, Then stop Ritalin  11/23/22 appt noted: Sunosi helpful and is more awake and it really works. More motivated to keep moving.  Gotten into habit or trying to go to bed reasonable hour unless gets stuck on computer on phone.  Can find endless distractions.  Situation is amplifying that. No SE. Still taking Ritalin. Tendency to be very withdrawn.   Chronic tension with wife and tends to avoid her bc her behavior.   Stilll using CPAP.   Seems to be anxious.  Like my world is collapsing .  Busy beating himself up on what he's not doing and W  jumps on board to be critical.   She is out of control with criticising him over trivial matters.  Criticism is unending.  Plan: continue Trintellix 10  Conintue bc benefit Sunosi to the 150 mg tablet Rec trial reduce Ritalin to 1/2 tablet 4 times daily for 2 week s, Then stop Ritalin.  He's getting from PCP Brunilda Payor.  02/21/23 appt noted: Got off Ritalin. For over a month.  A little shakey at first but got over it.  Had to stop abruptly Dt shortage and out of town.   I feel better psychologically off it.  Thinks he was a prisoner to Ritalin and Sunosi freed him from that.   Wonders about  weaning off Trintellix.  Trying to wean for sex activity if possible.   Some ED and tried them in the past. 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 Doing [employer] training and enjoying it a lot.  Teaching him  a lot.  Disc risks weaning Trintellix and will start after tax season. Anxiety is better than it was. Plan: Conintue bc benefit Sunosi to the 150 mg tablet Successful off Ritalin Okay to try to wean off Trintellix after tax season per his request.  Discussed the risk of relapse.  04/20/23 appt noted: Right now mood is good and feels satisfied with things except marriage.  Says even others , friends notice wife's anger problems.   When tried higher dose Trintellix didn't see benefit and had vague SE.  Seemed like it was too high.  But didn't stop it.   Current psych meds: Trintellix 10, sunosi 150 AM No current SE.   Overall feels more "stable" on Sunosi vs Ritalin.  Easier to take with less worrying about how it affects him.  Generally prefers to be on less med.   Has had low periods of depression .   Enjoys barbershop singing.  Active.   Plan no changes.  05/18/23 appt noted: Still doing well with meds.   Rare good night sleep.  Always feels he hasn't accomplished enough and has tendency to stay up too late at night doing tasks. Plan: no med changes  08/19/23 appt noted: Generally pretty good.  Not markedly dep.    Meds as above.  Feels they are both helping. Looked up SE Wonders if [employer] causing anxiety.  It is helping some but not resolved things.  Doesn't want to return to [facility] bc SE Has central and Obstructive sleep apnea.  Uses CPAP Can still fall asleep in day even with good night's sleep.  Will have apneic events napping in the chair.   Volunteering more and very busy.  Started [employer] and active.   Going well.   Home life no better.  Wife critical and negative. Wife says he's too easily distracted.  Like if doing task and goes by TV  then gets distracted by the TV. Plan: Continue bc benefit Sunosi to the 150 mg tablet BUT reduce to 112.5 mg to see if anxiety is better See if anxiety is better or not and then reevaluate wife's concerns about is inattention.   10/06/23 appt noted: Psych med; Trintellix 10, Sunosi reduced to about 100 mg  SE minimal. Thinks he is doing well overall.  More alert driving with the car with it.  Switched directly from [facility] to [facility] wihtout change in alertness.  Not as energetic and hyper with Sunosi.  Les anxiety with lower dose. Mood is pretty good.  Generally doing better.  More engaged and active.   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 To [place] Jan for competition.   He wants the least amount med and asks about reducing doses.    Past Psychiatric Medication Trials:  History [facility].  On and off antidepressants for 20 years  Doesn't remember any being helpful.  lexapro 10  Trintellix 10, 15 mg not more helpful and vague SE Remote Wellbutrin 450.  Doesn't remember the names of prior meds but remembers sexual SE with one. History of Nuvigil for hypersomnolence but he does not recall the response but apparently not good enough  so Ritalin was RX in it's place Sunosi 150 benefit  Review of Systems:  Review of Systems  Constitutional:  Positive for fatigue.  Eyes:  Positive for visual disturbance.  Cardiovascular:  Negative for chest pain and palpitations.  Gastrointestinal:  Negative for nausea.  Musculoskeletal:  Positive for arthralgias.  Neurological:  Negative for  tremors.  Psychiatric/Behavioral:  Positive for decreased concentration. Negative for dysphoric mood. The patient is nervous/anxious.     Medications: I have reviewed the patient's current medications.  Current Outpatient Medications  Medication Sig Dispense Refill   ciclopirox (PENLAC) 8 % solution Apply topically at bedtime. Apply over nail and surrounding skin. Apply daily over previous  coat. After seven (7) days, may remove with alcohol and continue cycle. 6.6 mL 2   CONTOUR NEXT TEST test strip      NOVOLOG 100 UNIT/ML injection U IN INSULIN PUMP .APPROXIMATELY 80 UNITS A DAY  6   PROLENSA 0.07 % SOLN INT 1 GTT IN OS HS  6   ramipril (ALTACE) 5 MG capsule Take 5 mg by mouth 2 (two) times daily.     SUNOSI 150 MG TABS TAKE 1 TABLET EVERY MORNING (Patient taking differently: Take 100 mg by mouth every morning.) 90 tablet 0   tadalafil (CIALIS) 20 MG tablet TAKE 1 TABLET(20 MG) BY MOUTH DAILY AS NEEDED FOR ERECTILE DYSFUNCTION 15 tablet 1   terbinafine (LAMISIL) 250 MG tablet TAKE 1 TABLET(250 MG) BY MOUTH DAILY 90 tablet 0   vortioxetine HBr (TRINTELLIX) 10 MG TABS tablet Take 1 tablet (10 mg total) by mouth daily. 90 tablet 1   Cyanocobalamin (VITAMIN B 12) 500 MCG TABS Take 2,000 Units by mouth.     magnesium 30 MG tablet Take 30 mg by mouth 2 (two) times daily.     No current facility-administered medications for this visit.    Medication Side Effects: None  Allergies: No Known Allergies  Past Medical History:  Diagnosis Date   Anxiety    Arthritis    Cancer (HCC)    skin CA removed   Depression    Diabetes mellitus    Diabetes mellitus without complication (HCC)    Type 1   Elevated PSA 11/2014   PCP ordered for patient to take Cipro for 21 days   Erectile dysfunction    Hypercholesteremia    Skin cancer of face    Sleep apnea    wears CPAP nightly    Family History  Problem Relation Age of Onset   Cancer Father        Esophageal/gastric cancer   Cancer Mother        Metastatic cancer   Rheum arthritis Mother    Colon cancer Neg Hx    Pancreatic cancer Neg Hx    Stomach cancer Neg Hx     Social History   Socioeconomic History   Marital status: Married    Spouse name: Not on file   Number of children: 2   Years of education: Not on file   Highest education level: Not on file  Occupational History   Occupation: Retired    Associate Professor: [employer]  Tobacco Use   Smoking status: Former    Current packs/day: 0.00    Average packs/day: 2.0 packs/day for 15.0 years (30.0 ttl pk-yrs)    Types: Cigarettes    Start date: 1966    Quit date: 31    Years since quitting: 43.8   Smokeless tobacco: Never  Substance and Sexual Activity   Alcohol use: No    Comment: 1986   Drug use: No   Sexual activity: Not on file  Other Topics Concern   Not on file  Social History Narrative    Merged History Encounter        Social Determinants of Corporate investment banker  Strain: Not on file  Food Insecurity: Not on file  Transportation Needs: Not on file  Physical Activity: Not on file  Stress: Not on file  Social Connections: Not on file  Intimate Partner Violence: Not on file    Past Medical History, Surgical history, Social history, and Family history were reviewed and updated as appropriate.   Please see review of systems for further details on the patient's review from today.   Objective:   Physical Exam:  There were no vitals taken for this visit.  Physical Exam Constitutional:      General: He is not in acute distress. Musculoskeletal:        General: No deformity.  Neurological:     Mental Status: He is alert and oriented to person, place, and time.     Cranial Nerves: No dysarthria.     Coordination: Coordination normal.     Gait: Gait normal.  Psychiatric:        Attention and Perception: Attention and perception normal. He does not perceive auditory or visual hallucinations.        Mood and Affect: Mood is anxious. Mood is not depressed. Affect is not labile, blunt or inappropriate.        Speech: Speech normal.        Behavior: Behavior normal. Behavior is not slowed. Behavior is cooperative.        Thought Content: Thought content normal. Thought content is not paranoid or delusional. Thought content does not include homicidal or suicidal ideation. Thought content does not include suicidal plan.         Cognition and Memory: Cognition and memory normal.        Judgment: Judgment normal.     Comments: Insight intact Sx much better and Stephen's ministry work helps. But residual anxiety     Lab Review:     Component Value Date/Time   NA 139 12/02/2014 0842   K 4.3 12/02/2014 0842   CL 106 12/02/2014 0842   CO2 25 12/02/2014 0842   GLUCOSE 223 (H) 12/02/2014 0842   BUN 24 (H) 12/02/2014 0842   CREATININE 0.87 12/02/2014 0842   CALCIUM 8.8 12/02/2014 0842   GFRNONAA 88 (L) 12/02/2014 0842   GFRAA >90 12/02/2014 0842       Component Value Date/Time   WBC 4.9 12/02/2014 0843   RBC 4.90 12/02/2014 0843   HGB 14.8 12/02/2014 0843   HCT 44.1 12/02/2014 0843   PLT 226 12/02/2014 0843   MCV 90.0 12/02/2014 0843   MCH 30.2 12/02/2014 0843   MCHC 33.6 12/02/2014 0843   RDW 13.1 12/02/2014 0843    No results found for: "POCLITH", "LITHIUM"   No results found for: "PHENYTOIN", "PHENOBARB", "VALPROATE", "CBMZ"   .res Assessment: Plan:    Breon "Homero Fellers" was seen today for follow-up.  Diagnoses and all orders for this visit:  Major depressive disorder, recurrent episode, moderate (HCC)  Generalized anxiety disorder  Sleep apnea with hypersomnolence  Obstructive sleep apnea      30 min  We discussed When first seen he denied significant depressive symptoms although his wife thought he was depressed.    He realizes he also needs therapy for this tendency to self sabotage and also for marital conflict that has resulted.  Ongoing conflict with his wife. Overall feels stronger and more confident. Sx much better and [employer] training helps.  Encourage activity in retirement.  Disc examples like classes, [facility].  Disc poss separation to diminish hostility  in the relationship, which in turn might help his depression bc she is highly critical of him.  Disc concerns about wife who is a pt.  She's moving to fast and angry.    Disc sleep hygiene and need to  get enough sleep.    Reduce Trintellix 5 mg daily and if well at FU will stop it. .  This is bc of the benefit of Sunosi might be sufficient for mood.  Generally good response and no SE. Disc option of increasing the dose to see if more motivation and activity. Disc SE .    Hypersomnolence predates antidepressants and failed to respond to armodafinil but is improved though not resolved with Ritalin 10 mg 4 times daily but even better with Sunosi than Ritalin Continue bc benefit Sunosi  BUT reduced to 112.5 mg bc anxiety is better  Cialis 10-20 mg prn.   Disc se and dosing range.  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 Still enjoys that.  Has been in counseling.     After Visit Summary for patient specific instructions  FU 3-4 months  Meredith Staggers, MD, DFAPA   Future Appointments  Date Time Provider Department Center  10/24/2023  8:45 AM Ihor Austin, NP [facility] None  12/08/2023  7:30 AM Sherrie George, MD [facility] None    No orders of the defined types were placed in this encounter.    -------------------------------

## 2023-10-13 DIAGNOSIS — E109 Type 1 diabetes mellitus without complications: Secondary | ICD-10-CM | POA: Diagnosis not present

## 2023-10-13 DIAGNOSIS — E1051 Type 1 diabetes mellitus with diabetic peripheral angiopathy without gangrene: Secondary | ICD-10-CM | POA: Diagnosis not present

## 2023-10-13 DIAGNOSIS — I739 Peripheral vascular disease, unspecified: Secondary | ICD-10-CM | POA: Diagnosis not present

## 2023-10-13 DIAGNOSIS — G4733 Obstructive sleep apnea (adult) (pediatric): Secondary | ICD-10-CM | POA: Diagnosis not present

## 2023-10-13 DIAGNOSIS — E785 Hyperlipidemia, unspecified: Secondary | ICD-10-CM | POA: Diagnosis not present

## 2023-10-13 DIAGNOSIS — R82998 Other abnormal findings in urine: Secondary | ICD-10-CM | POA: Diagnosis not present

## 2023-10-13 DIAGNOSIS — I10 Essential (primary) hypertension: Secondary | ICD-10-CM | POA: Diagnosis not present

## 2023-10-13 DIAGNOSIS — Z0000 Encounter for general adult medical examination without abnormal findings: Secondary | ICD-10-CM | POA: Diagnosis not present

## 2023-10-13 DIAGNOSIS — R972 Elevated prostate specific antigen [PSA]: Secondary | ICD-10-CM | POA: Diagnosis not present

## 2023-10-20 NOTE — Progress Notes (Signed)
[facility] [address] [place]. Jermaine Waters [address] [phone]       OFFICE FOLLOW UP NOTE  [name] Date of Birth:  1946/01/08 Medical Record Number:  [id number]   Primary neurologist: Dr. Vickey Huger Reason for visit: CPAP follow-up    SUBJECTIVE:   Follow-up visit:  Prior visit: 10/21/2022   Brief HPI:   Jermaine Waters is a 77 y.o. male with longstanding history of sleep apnea and transferred care to Dr. Vickey Huger in 2019.  Prior sleep study 2012.  Compliance report at initial visit showed elevated residual AHI on AutoPap pressure setting of 8-15 with EPR 2 and adjusted to 7-17 with EPR 3.  Was due for new machine and repeated sleep study 10/2020 which confirmed presence of severe sleep apnea with AHI of 40/h and REM AHI 59/h.  Received new AutoPap 08/2021.  At prior visit, compliance report showed excellent compliance and optimal residual AHI.  No changes at that time.    Interval history:  CPAP compliance report shows excellent usage and optimal residual AHI.  Continues to tolerate CPAP well.  Routinely follows with DME company [employer] and up-to-date on supplies. ESS 9/24.  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  No questions or concerns at this time.          ROS:   14 system review of systems performed and negative with exception of those listed in HPI  PMH:  Past Medical History:  Diagnosis Date   Anxiety    Arthritis    Cancer (HCC)    skin CA removed   Depression    Diabetes mellitus    Diabetes mellitus without complication (HCC)    Type 1   Elevated PSA 11/2014   PCP ordered for patient to take Cipro for 21 days   Erectile dysfunction    Hypercholesteremia    Skin cancer of face    Sleep apnea    wears CPAP nightly    PSH:  Past Surgical History:  Procedure Laterality Date   APPENDECTOMY      CHOLECYSTECTOMY     COLONOSCOPY     EYE SURGERY Bilateral    laser   ROTATOR CUFF REPAIR Right    SEPTOPLASTY Bilateral 12/11/2014   Procedure: NASAL BILATERAL SEPTOPLASTY;  Surgeon: Osborn Coho, MD;  Location: [facility] OR;  Service: ENT;  Laterality: Bilateral;   SHOULDER ARTHROSCOPY WITH ROTATOR CUFF REPAIR AND SUBACROMIAL DECOMPRESSION Right 04/05/2013   Procedure: RIGHT SHOULDER ARTHROSCOPY SUBACROMIAL DECOMPRESSION DISTAL CLAVICLE RESECTION AND ROTATOR CUFF REPAIR ;  Surgeon: Senaida Lange, MD;  Location: [facility] OR;  Service: Orthopedics;  Laterality: Right;   TONSILLECTOMY     TURBINATE REDUCTION N/A 12/11/2014   Procedure: INFERIOR TURBINATE REDUCTION;  Surgeon: Osborn Coho, MD;  Location: [facility] OR;  Service: ENT;  Laterality: N/A;    Social History:  Social History   Socioeconomic History   Marital status: Married    Spouse name: Not on file   Number of children: 2   Years of education: Not on file   Highest education level: Not on file  Occupational History   Occupation: Retired    Associate Professor: [employer]  Tobacco Use   Smoking status: Former    Current packs/day: 0.00    Average packs/day: 2.0 packs/day for 15.0 years (30.0 ttl pk-yrs)    Types: Cigarettes    Start date: 1966  Quit date: 70    Years since quitting: 43.9   Smokeless tobacco: Never  Substance and Sexual Activity   Alcohol use: No    Comment: 1986   Drug use: No   Sexual activity: Not on file  Other Topics Concern   Not on file  Social History Narrative    Merged History Encounter        Social Determinants of Health   Financial Resource Strain: Not on file  Food Insecurity: Not on file  Transportation Needs: Not on file  Physical Activity: Not on file  Stress: Not on file  Social Connections: Not on file  Intimate Partner Violence: Not on file    Family History:  Family History  Problem Relation Age of Onset   Cancer Father        Esophageal/gastric cancer   Cancer Mother         Metastatic cancer   Rheum arthritis Mother    Colon cancer Neg Hx    Pancreatic cancer Neg Hx    Stomach cancer Neg Hx     Medications:   Current Outpatient Medications on File Prior to Visit  Medication Sig Dispense Refill   ciclopirox (PENLAC) 8 % solution Apply topically at bedtime. Apply over nail and surrounding skin. Apply daily over previous coat. After seven (7) days, may remove with alcohol and continue cycle. 6.6 mL 2   ciprofloxacin (CIPRO) 500 MG tablet Take 500 mg by mouth daily with breakfast.     CONTOUR NEXT TEST test strip      Multiple Vitamins-Minerals (PRESERVISION AREDS 2) CAPS See admin instructions.     NOVOLOG 100 UNIT/ML injection U IN INSULIN PUMP .APPROXIMATELY 80 UNITS A DAY  6   PROLENSA 0.07 % SOLN INT 1 GTT IN OS HS  6   ramipril (ALTACE) 5 MG capsule Take 5 mg by mouth 2 (two) times daily.     SUNOSI 150 MG TABS TAKE 1 TABLET EVERY MORNING (Patient taking differently: Take 100 mg by mouth every morning.) 90 tablet 0   tadalafil (CIALIS) 20 MG tablet TAKE 1 TABLET(20 MG) BY MOUTH DAILY AS NEEDED FOR ERECTILE DYSFUNCTION 15 tablet 1   vortioxetine HBr (TRINTELLIX) 10 MG TABS tablet Take 1 tablet (10 mg total) by mouth daily. 90 tablet 1   No current facility-administered medications on file prior to visit.    Allergies:   Allergies  Allergen Reactions   Pravastatin Other (See Comments)      OBJECTIVE:  Physical Exam  Vitals:   10/24/23 0832  BP: (!) 138/54  Pulse: 71  Weight: 168 lb 3.2 oz (76.3 kg)  Height: 5\' 4"  (1.626 m)   Body mass index is 28.87 kg/m. No results found.  General: well developed, well nourished, very pleasant elderly Caucasian male, seated, in no evident distress  Neurologic Exam Mental Status: Awake and fully alert. Oriented to place and time. Recent and remote memory intact. Attention span, concentration and fund of knowledge appropriate. Mood and affect appropriate.  Cranial Nerves: Pupils equal, briskly reactive  to light. Extraocular movements full without nystagmus. Visual fields full to confrontation. Hearing intact. Facial sensation intact. Face, tongue, palate moves normally and symmetrically.  Motor: Normal bulk and tone. Normal strength in all tested extremity muscles Gait and Station: Arises from chair without difficulty. Stance is normal. Gait demonstrates normal stride length and balance      HST 11/03/2020 Per Dr. Vickey Huger: IMPRESSION: OSA (obstructive sleep apnea)  Summary & Diagnosis:  This HST confirms the presence of severe OSA - the AHI here was  48/h, the REM AHI was 59/h. The patient spent 32.7 minutes in  hypoxia and all night in supine position.          ASSESSMENT/PLAN: Jermaine Waters is a 77 y.o. year old male    Complex sleep apnea syndrome  -Continue current settings as apnea well-controlled -Continue nightly usage for adequate CPAP management -Routinely follow with DME company for any needed supplies or CPAP related concerns   Orders Placed This Encounter  Procedures   For home use only DME continuous positive airway pressure (CPAP)    Continue current pressure settings    Order Specific Question:   Length of Need    Answer:   Lifetime    Order Specific Question:   Patient has OSA or probable OSA    Answer:   Yes    Order Specific Question:   Is the patient currently using CPAP in the home    Answer:   Yes    Order Specific Question:   If yes (to question two)    Answer:   Determine DME provider and inform them of any new orders/settings    Order Specific Question:   Date of face to face encounter    Answer:   10/24/2023    Order Specific Question:   Settings    Answer:   Autotitration    Order Specific Question:   CPAP supplies needed    Answer:   Mask, headgear, cushions, filters, heated tubing and water chamber       Follow up in 1 year via MyChart video visit or call earlier if needed     I spent 20 minutes of face-to-face and  non-face-to-face time with patient.  This included previsit chart review, lab review, study review, order entry, electronic health record documentation, patient education and discussion regarding above diagnoses and treatment plan and answered all other questions to patient's satisfaction  Ihor Austin, [facility]  [facility] [address] [place], [place] [address]  Phone [phone] Fax [phone] Note: This document was prepared with digital dictation and possible smart phrase technology. Any transcriptional errors that result from this process are unintentional.

## 2023-10-24 ENCOUNTER — Ambulatory Visit (INDEPENDENT_AMBULATORY_CARE_PROVIDER_SITE_OTHER): Payer: Medicare Other | Admitting: Adult Health

## 2023-10-24 ENCOUNTER — Encounter: Payer: Self-pay | Admitting: Adult Health

## 2023-10-24 VITALS — BP 138/54 | HR 71 | Ht 64.0 in | Wt 168.2 lb

## 2023-10-24 DIAGNOSIS — G4739 Other sleep apnea: Secondary | ICD-10-CM | POA: Diagnosis not present

## 2023-10-24 NOTE — Patient Instructions (Addendum)
Your Plan:  Continue nightly use of CPAP for adequate sleep apnea management  Continue to follow with your DME company [employer] for any needed supplies or CPAP related concerns    Follow-up in 1 year or call earlier if needed     Thank you for coming to see [place] at [facility]. I hope we have been able to provide you high quality care today.  You may receive a patient satisfaction survey over the next few weeks. We would appreciate your feedback and comments so that we may continue to improve ourselves and the health of our patients.

## 2023-11-08 DIAGNOSIS — R972 Elevated prostate specific antigen [PSA]: Secondary | ICD-10-CM | POA: Diagnosis not present

## 2023-11-17 DIAGNOSIS — I788 Other diseases of capillaries: Secondary | ICD-10-CM | POA: Diagnosis not present

## 2023-11-17 DIAGNOSIS — Z85828 Personal history of other malignant neoplasm of skin: Secondary | ICD-10-CM | POA: Diagnosis not present

## 2023-11-17 DIAGNOSIS — D2239 Melanocytic nevi of other parts of face: Secondary | ICD-10-CM | POA: Diagnosis not present

## 2023-11-17 DIAGNOSIS — L821 Other seborrheic keratosis: Secondary | ICD-10-CM | POA: Diagnosis not present

## 2023-11-17 DIAGNOSIS — L570 Actinic keratosis: Secondary | ICD-10-CM | POA: Diagnosis not present

## 2023-11-17 DIAGNOSIS — L720 Epidermal cyst: Secondary | ICD-10-CM | POA: Diagnosis not present

## 2023-11-17 DIAGNOSIS — L812 Freckles: Secondary | ICD-10-CM | POA: Diagnosis not present

## 2023-11-20 ENCOUNTER — Other Ambulatory Visit: Payer: Self-pay | Admitting: Psychiatry

## 2023-12-08 ENCOUNTER — Encounter (INDEPENDENT_AMBULATORY_CARE_PROVIDER_SITE_OTHER): Payer: Medicare Other | Admitting: Ophthalmology

## 2023-12-08 DIAGNOSIS — H35033 Hypertensive retinopathy, bilateral: Secondary | ICD-10-CM

## 2023-12-08 DIAGNOSIS — H34812 Central retinal vein occlusion, left eye, with macular edema: Secondary | ICD-10-CM

## 2023-12-08 DIAGNOSIS — E113591 Type 2 diabetes mellitus with proliferative diabetic retinopathy without macular edema, right eye: Secondary | ICD-10-CM

## 2023-12-08 DIAGNOSIS — H43813 Vitreous degeneration, bilateral: Secondary | ICD-10-CM | POA: Diagnosis not present

## 2023-12-08 DIAGNOSIS — E113512 Type 2 diabetes mellitus with proliferative diabetic retinopathy with macular edema, left eye: Secondary | ICD-10-CM | POA: Diagnosis not present

## 2023-12-08 DIAGNOSIS — I10 Essential (primary) hypertension: Secondary | ICD-10-CM | POA: Diagnosis not present

## 2023-12-08 DIAGNOSIS — Z794 Long term (current) use of insulin: Secondary | ICD-10-CM | POA: Diagnosis not present

## 2023-12-21 ENCOUNTER — Ambulatory Visit: Payer: Federal, State, Local not specified - PPO | Admitting: Psychiatry

## 2023-12-27 DIAGNOSIS — Z794 Long term (current) use of insulin: Secondary | ICD-10-CM | POA: Diagnosis not present

## 2023-12-27 DIAGNOSIS — G720 Drug-induced myopathy: Secondary | ICD-10-CM | POA: Diagnosis not present

## 2023-12-27 DIAGNOSIS — Z4681 Encounter for fitting and adjustment of insulin pump: Secondary | ICD-10-CM | POA: Diagnosis not present

## 2023-12-27 DIAGNOSIS — E785 Hyperlipidemia, unspecified: Secondary | ICD-10-CM | POA: Diagnosis not present

## 2023-12-27 DIAGNOSIS — E1040 Type 1 diabetes mellitus with diabetic neuropathy, unspecified: Secondary | ICD-10-CM | POA: Diagnosis not present

## 2023-12-27 DIAGNOSIS — I10 Essential (primary) hypertension: Secondary | ICD-10-CM | POA: Diagnosis not present

## 2024-01-23 DIAGNOSIS — N5201 Erectile dysfunction due to arterial insufficiency: Secondary | ICD-10-CM | POA: Diagnosis not present

## 2024-01-23 DIAGNOSIS — R350 Frequency of micturition: Secondary | ICD-10-CM | POA: Diagnosis not present

## 2024-01-23 DIAGNOSIS — R972 Elevated prostate specific antigen [PSA]: Secondary | ICD-10-CM | POA: Diagnosis not present

## 2024-01-26 ENCOUNTER — Other Ambulatory Visit: Payer: Self-pay | Admitting: Psychiatry

## 2024-01-26 DIAGNOSIS — F341 Dysthymic disorder: Secondary | ICD-10-CM

## 2024-01-26 DIAGNOSIS — F411 Generalized anxiety disorder: Secondary | ICD-10-CM

## 2024-01-26 DIAGNOSIS — G4710 Hypersomnia, unspecified: Secondary | ICD-10-CM

## 2024-01-26 DIAGNOSIS — F331 Major depressive disorder, recurrent, moderate: Secondary | ICD-10-CM

## 2024-01-26 NOTE — Telephone Encounter (Signed)
 Please schedule pt an appt LV 10/31 NS 1/15

## 2024-01-27 NOTE — Telephone Encounter (Signed)
Has appt 5/7

## 2024-01-30 ENCOUNTER — Other Ambulatory Visit: Payer: Self-pay

## 2024-02-01 ENCOUNTER — Encounter: Payer: Self-pay | Admitting: Psychiatry

## 2024-02-01 ENCOUNTER — Ambulatory Visit (INDEPENDENT_AMBULATORY_CARE_PROVIDER_SITE_OTHER): Payer: Medicare Other | Admitting: Psychiatry

## 2024-02-01 DIAGNOSIS — F331 Major depressive disorder, recurrent, moderate: Secondary | ICD-10-CM | POA: Diagnosis not present

## 2024-02-01 DIAGNOSIS — G4730 Sleep apnea, unspecified: Secondary | ICD-10-CM | POA: Diagnosis not present

## 2024-02-01 DIAGNOSIS — G4710 Hypersomnia, unspecified: Secondary | ICD-10-CM | POA: Diagnosis not present

## 2024-02-01 DIAGNOSIS — F341 Dysthymic disorder: Secondary | ICD-10-CM | POA: Diagnosis not present

## 2024-02-01 DIAGNOSIS — F411 Generalized anxiety disorder: Secondary | ICD-10-CM | POA: Diagnosis not present

## 2024-02-01 NOTE — Progress Notes (Signed)
 Jermaine Waters [id number] 16-Oct-1946 78 y.o.   Subjective:   Patient ID:  Jermaine Waters is a 78 y.o. (DOB 02/20/1946) male.  Chief Complaint:  Chief Complaint  Patient presents with   Follow-up   Depression   ADD   Sleeping Problem    Jermaine Waters presents to the office today for follow-up of mood concerns and hypersomnolence.    Patient was seen August 08, 2019.  The assessment was depression.  The following was done: STOP  bupropion 75 mg. Start bupropion XL 150 mg tablets 1 each morning for 2 weeks. If no side effects such as jitteriness, then increase to 2 tablets each morning which equals 300 mg daily. He continued Ritalin 10 QID.  Patient was seen October 10, 2019.  He had a partial response with Wellbutrin XL 300 mg daily.  He was tolerating it and therefore the dosage was increased to 450 mg a day. He continued Ritalin 10 mg 4 times daily No Covid problems.   Didn't notice much difference with increase Wellbutrin.  Wife thinks he's a little more lethargic.  In some ways a little happier but really struggling with wife.  Pending temporary separation for a few months to try to provide some healing.  Hopefully will help both of them.   Wife concerned about Ritalin Rx for alertness DT OSA and central apnea that even affects him while awake.  PT meds could fall asleep driving.  Ritalin helps and CPAP helps.  Helps alerness and no insomnia.  Wonders about SE of it. A little more optimistic and less tense.  Therapy helpng also. No SE.  Not jitttery nor edgy.  Wife critical that he's less responsive, but maybe I'm less on edge. Energy is fine.  Interests hard to answer bc not a lot of outside activities left DT Covid.  Couple of small groups meeting with Zoom.  Most of free time before was singing but can't do it now. Things changed and wants meds and therapy.  PCP started Wellbutrin a couple of weeks ago. All my life self-sabotage with jobs and has to restart.  Fear of failure.   Some worsening health issues dropping productivity.  Wife frustrated with him and constantly advising and critical.  Marital stress and unhappiness worse bc together all the time. Plan because of residual depression consideration was given to augment with Abilify.  He was in the process of moving and he felt that perhaps after the move he might have a different view and he wanted to defer.  So no meds were changed.  Appointment Apr 07, 2020, the following was noted: He did get moved.  Still having conflict with his wife which is a chronic stress. Pt reports that mood is Anxious, Depressed and Dysphoric and describes depression and anxiety as mild or mild to moderate but overall improved since last visit.. Anxiety symptoms include: Excessive Worry, avoidant of stress or pressure or commitments. . Pt reports no sleep issues and 7 hours but prefers 8 hours. Pt reports that appetite is good. Pt reports that energy is poor and anhedonia, poor motivation and withdrawn from usual activities. Concentration is down slightly. Suicidal thoughts:  denied by patient. Plan:  No med changes  05/21/2020 appointment the following is noted: Urgent appt at his request. One concern is more anxiety and procrastinates.Jermaine Waters if he's working on the wrong problem.  Jermaine Waters of everything.  ? Fear of failure.  Easily stressed by small demands.  Stress averse and not operating at  full capacity. No history of anxiety meds.   Counseling. Ritalin 10 QID for hypersomnolence but questions ADD bc is distractible.  Sleep apnea for 20 years on CPAP.  Chronically sleepy. Plan: no med changes:  08/26/2020 appointment with the following noted: Increased Wellbutrin to 450 mg has worked fine. Feeling great.  Only negative is sleepy if sits down to read. Sleepy all his life and long term CPAP user.  Started class 4 year [place]. No trouble with [place] since July 20 and no anxiety while she's gone.   Plan: Doing well.   No med changes  12/31/2020 appointment with following noted: Wife joined per patient request Overall OK but somewhat lifeless and disconnected and wonders if med related or not and if changes can be made. Otherwise not anxious.  Not joyful.   Wife Jermaine Waters has different perspective in some areas.  She thinks he's too inactive.  She says since retirement he lacks drive to do much.  Rare exercise. Plan: Reduce the Lexapro to 5 mg nightly to see if it is having a dulling effect   02/25/2021 appointment with the following noted: No difference noted with less Lexapro.  Overall sort of cloudy and in my own world.  Wife CO his memory issues.  Depressed, frustrated, overwhelmed and probably worse after the reduction in meds. Caring for wife with TKR.  Stressful relationship very. Does go out 3-4 nights per week with various groups and some church activity.  Plan: DC Lexapro and start duloxetine 30 mg for a week then 60 mg daily. Continue Wellbutrin XL 450 mg daily Continue Ritalin 10 mg 4 times daily  06/01/2021 appointment with the following noted: A little more irritable.  Esp with his wife.  Wife complains about him.  He's afraid of saying something that upsets her. Chronic sleepiness is worse he thinks.  Thinks it's med related. Depression is unchanged.   Plan: Wean duloxetine using 20 mg capsules to 40 mg daily for 2 weeks then 20 mg daily for 2 weeks then stop it in order to prevent withdrawal symptoms.  He will pay attention to see if irritability and alertness are improved. Continue Wellbutrin XL 450 mg daily Continue Ritalin 10 mg tablets 4 times daily  08/20/2021 appointment with the following noted: At times doesn't think any meds are doing anything and doesn't remember meds helping him.  Wonders if he can get off the Wellbutrin Neuropsych testing is not suggesting dementia. Still chronic marital problems no better and hostile relationship.  Talking about separation.  Working to sell the  house. Not highly motivated.  Knows he'd feel better separated from wife.  Can find things he enjoys at home.  Thinks he's underachieved in life.  Occ feels hopeless.  Lists of things he needs to do with constant sense of procrastination on things necessary to do. Plan: Wean Wellbutrin and start Trintellix 5 mg daily for 4 to 7 days and if there is no nausea then increase to 10 mg daily.  09/16/21 appt noted: Just increased Trintellix to 10 mg daily 2 days ago.  No SE noted. Off Wellbutrin. Signed contract to sell house by end of month and wife gone off deep-end and not functioning and is at home in bed.  She's having more emotional outbursts and "definitely hyped".  He thinks she's hyper and talking constantly and more aggressive, slamming doors etc than usual. Overall he feels stronger and happier with change in meds.  He'd like to contrinue it.  Plan continue Trintellix 10 mg daily and Ritalin 10 mg 4 times daily  10/20/2021 appointment with the following noted: Trintellix working feeling stronger, lighter, generally more optimistic.  In complete contrast to wife.  I'm glad I'm on Trintellix. Satisfied with Ritalin.  Missed 4 days and was very tired.  On it 20 years. Overall satisfied with meds. Remains under high stress with wife and at home. No SE. No med changes  01/20/2022 appointment with the following noted: No Still on Trintellix 10 and Ritalin and making life better. No SE. Better mood has been maintained. No problems with sleep.  Using CPAP Satisfied with Ritalin.  Missed 4 days and was very tired.  On it 20 years. Still sings in group.    09/02/22 appt noted: Ongoing marital stress but maybe some progress.  Overall thinks she's still depressed. This causes him to wrestle too but counseling creates some issues.  Joint sessions with Danice Goltz are helping.   Continues Trintellix 10 and Ritalin 10 QID. Compliant without a problem.   Continues singing. Normally 6-7 hours  of sleep but even if 9 hours falls asleep reading. CPAP working properly.  Consistent with it. Mood about the same as Nov 2022.   No SE.   Plan: cont Trintellix 10 Start one half of 75 mg tablet Sunosi in the morning for 3 days, Then increase Sunosi to 175 mg tablet in the morning for 1 week, Then if needed increase Sunosi to 1-1/2 of the 75 mg tablets in the morning for 1 week, Then if needed increase Sunosi to the 150 mg tablet If it is helpful reduce Ritalin to 1/2 tablet 4 times daily for 2 week s, Then stop Ritalin  11/23/22 appt noted: Sunosi helpful and is more awake and it really works. More motivated to keep moving.  Gotten into habit or trying to go to bed reasonable hour unless gets stuck on computer on phone.  Can find endless distractions.  Situation is amplifying that. No SE. Still taking Ritalin. Tendency to be very withdrawn.   Chronic tension with wife and tends to avoid her bc her behavior.   Stilll using CPAP.   Seems to be anxious.  Like my world is collapsing .  Busy beating himself up on what he's not doing and W  jumps on board to be critical.   She is out of control with criticising him over trivial matters.  Criticism is unending.  Plan: continue Trintellix 10  Conintue bc benefit Sunosi to the 150 mg tablet Rec trial reduce Ritalin to 1/2 tablet 4 times daily for 2 week s, Then stop Ritalin.  He's getting from PCP Brunilda Payor.  02/21/23 appt noted: Got off Ritalin. For over a month.  A little shakey at first but got over it.  Had to stop abruptly Dt shortage and out of town.   I feel better psychologically off it.  Thinks he was a prisoner to Ritalin and Sunosi freed him from that.   Wonders about weaning off Trintellix.  Trying to wean for sex activity if possible.   Some ED and tried them in the past. 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 Doing [employer] training and enjoying it a lot.  Teaching him a lot.  Disc risks weaning Trintellix and  will start after tax season. Anxiety is better than it was. Plan: Conintue bc benefit Sunosi to the 150 mg tablet Successful off Ritalin Okay to try to wean off Trintellix after  tax season per his request.  Discussed the risk of relapse.  04/20/23 appt noted: Right now mood is good and feels satisfied with things except marriage.  Says even others , friends notice wife's anger problems.   When tried higher dose Trintellix didn't see benefit and had vague SE.  Seemed like it was too high.  But didn't stop it.   Current psych meds: Trintellix 10, sunosi 150 AM No current SE.   Overall feels more "stable" on Sunosi vs Ritalin.  Easier to take with less worrying about how it affects him.  Generally prefers to be on less med.   Has had low periods of depression .   Enjoys barbershop singing.  Active.   Plan no changes.  05/18/23 appt noted: Still doing well with meds.   Rare good night sleep.  Always feels he hasn't accomplished enough and has tendency to stay up too late at night doing tasks. Plan: no med changes  08/19/23 appt noted: Generally pretty good.  Not markedly dep.    Meds as above.  Feels they are both helping. Looked up SE Wonders if [employer] causing anxiety.  It is helping some but not resolved things.  Doesn't want to return to [facility] bc SE Has central and Obstructive sleep apnea.  Uses CPAP Can still fall asleep in day even with good night's sleep.  Will have apneic events napping in the chair.   Volunteering more and very busy.  Started [employer] and active.   Going well.   Home life no better.  Wife critical and negative. Wife says he's too easily distracted.  Like if doing task and goes by TV then gets distracted by the TV. Plan: Continue bc benefit Sunosi to the 150 mg tablet BUT reduce to 112.5 mg to see if anxiety is better See if anxiety is better or not and then reevaluate wife's concerns about is inattention.   10/06/23 appt noted: Psych med;  Trintellix 10, Sunosi reduced to about 100 mg  SE minimal. Thinks he is doing well overall.  More alert driving with the car with it.  [facility]itched directly from [facility] to [facility] wihtout change in alertness.  Not as energetic and hyper with Sunosi.  Les anxiety with lower dose. Mood is pretty good.  Generally doing better.  More engaged and active.   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 To [place] Jan for competition.   He wants the least amount med and asks about reducing doses.   Plan: Reduce Trintellix 5 mg daily and if well at FU will stop it. .  This is bc of the benefit of Sunosi might be sufficient for mood.    02/01/24 appt noted: Med: Trintellix 10, Sunosi 150 Went 3-4 weeks into Trintellix 5 and more dep and increased back to 10 mg dialy. Feels pretty good now.  Increased Sunosi 150. Busy and not always as much sleep.  When good night feels great.  Avg 5-8 hour.   Chronically feels behind and stays up too late doing things. Not yawning all day with Sunosi.  Less napping and more alert with Sunosi.  Used to be so drowsy he'd fall asleep at stop lights. Sunosi is easier to take than Ritalin.   Past Psychiatric Medication Trials:  History [facility].  On and off antidepressants for 20 years  Doesn't remember any being helpful.  lexapro 10  Trintellix 10, 15 mg not more helpful and vague SE Remote Wellbutrin 450.  Doesn't  remember the names of prior meds but remembers sexual SE with one. History of Nuvigil for hypersomnolence but he does not recall the response but apparently not good enough  so Ritalin was RX in it's place Sunosi 150 benefit  Review of Systems:  Review of Systems  Constitutional:  Positive for fatigue.  Eyes:  Positive for visual disturbance.  Cardiovascular:  Negative for chest pain and palpitations.  Gastrointestinal:  Negative for nausea.  Musculoskeletal:  Positive for arthralgias.  Neurological:  Negative for tremors.   Psychiatric/Behavioral:  Positive for decreased concentration. Negative for dysphoric mood. The patient is not nervous/anxious.     Medications: I have reviewed the patient's current medications.  Current Outpatient Medications  Medication Sig Dispense Refill   NOVOLOG 100 UNIT/ML injection U IN INSULIN PUMP .APPROXIMATELY 80 UNITS A DAY  6   PROLENSA 0.07 % SOLN INT 1 GTT IN OS HS  6   ramipril (ALTACE) 5 MG capsule Take 5 mg by mouth 2 (two) times daily.     SUNOSI 150 MG TABS TAKE 1 TABLET EVERY MORNING 90 tablet 0   tadalafil (CIALIS) 20 MG tablet TAKE 1 TABLET(20 MG) BY MOUTH DAILY AS NEEDED FOR ERECTILE DYSFUNCTION 15 tablet 1   TRINTELLIX 10 MG TABS tablet TAKE 1 TABLET DAILY 90 tablet 1   ciclopirox (PENLAC) 8 % solution Apply topically at bedtime. Apply over nail and surrounding skin. Apply daily over previous coat. After seven (7) days, may remove with alcohol and continue cycle. (Patient not taking: Reported on 02/01/2024) 6.6 mL 2   ciprofloxacin (CIPRO) 500 MG tablet Take 500 mg by mouth daily with breakfast. (Patient not taking: Reported on 02/01/2024)     CONTOUR NEXT TEST test strip      Multiple Vitamins-Minerals (PRESERVISION AREDS 2) CAPS See admin instructions.     No current facility-administered medications for this visit.    Medication Side Effects: None  Allergies:  Allergies  Allergen Reactions   Pravastatin Other (See Comments)    Past Medical History:  Diagnosis Date   Anxiety    Arthritis    Cancer (HCC)    skin CA removed   Depression    Diabetes mellitus    Diabetes mellitus without complication (HCC)    Type 1   Elevated PSA 11/2014   PCP ordered for patient to take Cipro for 21 days   Erectile dysfunction    Hypercholesteremia    Skin cancer of face    Sleep apnea    wears CPAP nightly    Family History  Problem Relation Age of Onset   Cancer Father        Esophageal/gastric cancer   Cancer Mother        Metastatic cancer   Rheum  arthritis Mother    Colon cancer Neg Hx    Pancreatic cancer Neg Hx    Stomach cancer Neg Hx     Social History   Socioeconomic History   Marital status: Married    Spouse name: Not on file   Number of children: 2   Years of education: Not on file   Highest education level: Not on file  Occupational History   Occupation: Retired    Associate Professor: [employer]  Tobacco Use   Smoking status: Former    Current packs/day: 0.00    Average packs/day: 2.0 packs/day for 15.0 years (30.0 ttl pk-yrs)    Types: Cigarettes    Start date: 1966    Quit date: 37  Years since quitting: 44.1   Smokeless tobacco: Never  Substance and Sexual Activity   Alcohol use: No    Comment: 1986   Drug use: No   Sexual activity: Not on file  Other Topics Concern   Not on file  Social History Narrative    Merged History Encounter        Social Drivers of Health   Financial Resource Strain: Not on file  Food Insecurity: Not on file  Transportation Needs: Not on file  Physical Activity: Not on file  Stress: Not on file  Social Connections: Not on file  Intimate Partner Violence: Not on file    Past Medical History, Surgical history, Social history, and Family history were reviewed and updated as appropriate.   Please see review of systems for further details on the patient's review from today.   Objective:   Physical Exam:  There were no vitals taken for this visit.  Physical Exam Constitutional:      General: He is not in acute distress. Musculoskeletal:        General: No deformity.  Neurological:     Mental Status: He is alert and oriented to person, place, and time.     Cranial Nerves: No dysarthria.     Coordination: Coordination normal.     Gait: Gait normal.  Psychiatric:        Attention and Perception: Attention and perception normal. He does not perceive auditory or visual hallucinations.        Mood and Affect: Mood is anxious. Mood is not depressed. Affect is not  labile, blunt or inappropriate.        Speech: Speech normal.        Behavior: Behavior normal. Behavior is not slowed. Behavior is cooperative.        Thought Content: Thought content normal. Thought content is not paranoid or delusional. Thought content does not include homicidal or suicidal ideation. Thought content does not include suicidal plan.        Cognition and Memory: Cognition and memory normal.        Judgment: Judgment normal.     Comments: Insight intact Sx much better and Stephen's ministry work helps. But residual anxiety Not much dep after inreasing Trintellix     Lab Review:     Component Value Date/Time   NA 139 12/02/2014 0842   K 4.3 12/02/2014 0842   CL 106 12/02/2014 0842   CO2 25 12/02/2014 0842   GLUCOSE 223 (H) 12/02/2014 0842   BUN 24 (H) 12/02/2014 0842   CREATININE 0.87 12/02/2014 0842   CALCIUM 8.8 12/02/2014 0842   GFRNONAA 88 (L) 12/02/2014 0842   GFRAA >90 12/02/2014 0842       Component Value Date/Time   WBC 4.9 12/02/2014 0843   RBC 4.90 12/02/2014 0843   HGB 14.8 12/02/2014 0843   HCT 44.1 12/02/2014 0843   PLT 226 12/02/2014 0843   MCV 90.0 12/02/2014 0843   MCH 30.2 12/02/2014 0843   MCHC 33.6 12/02/2014 0843   RDW 13.1 12/02/2014 0843    No results found for: "POCLITH", "LITHIUM"   No results found for: "PHENYTOIN", "PHENOBARB", "VALPROATE", "CBMZ"   .res Assessment: Plan:    Edwar "Homero Fellers" was seen today for follow-up, depression, add and sleeping problem.  Diagnoses and all orders for this visit:  Major depressive disorder, recurrent episode, moderate (HCC)  Generalized anxiety disorder  Sleep apnea with hypersomnolence  Early onset dysthymia    30 min  We discussed When first seen he denied significant depressive symptoms although his wife thought he was depressed.    He realizes he also needs therapy for this tendency to self sabotage and also for marital conflict that has resulted.  Ongoing conflict with his  wife. Overall feels stronger and more confident. Sx much better and [employer] training helps.  Encourage activity in retirement.  Disc examples like classes, [facility].  Disc poss separation to diminish hostility in the relationship, which in turn might help his depression bc she is highly critical of him.  Disc concerns about wife who is a pt.  She's moving to fast and angry.    Disc sleep hygiene and need to get enough sleep.    Better mood with Trintellix 10.  Generally good response and no SE. Disc option of increasing the dose to see if more motivation and activity. Disc SE .    Hypersomnolence predates antidepressants and failed to respond to armodafinil but is improved though not resolved with Ritalin 10 mg 4 times daily but even better with Sunosi than Ritalin Continue bc benefit Sunosi  150 mg bc anxiety is better  Cialis 10-20 mg prn.   Disc se and dosing range.  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 Still enjoys that.  Has been in counseling.     FU 3-4 months  Meredith Staggers, MD, DFAPA   Future Appointments  Date Time Provider Department Center  02/16/2024  7:30 AM Sherrie George, MD [facility] None  04/11/2024 10:30 AM Cottle, Steva Ready., MD [facility] None  10/11/2024  3:45 PM Ihor Austin, NP [facility] None    No orders of the defined types were placed in this encounter.    -------------------------------

## 2024-02-02 ENCOUNTER — Telehealth: Payer: Self-pay

## 2024-02-02 NOTE — Telephone Encounter (Signed)
 Prior Authorization submitted for Sunosi 150 mg #90 with [facility], approval received effective through 02/01/25

## 2024-02-16 ENCOUNTER — Encounter (INDEPENDENT_AMBULATORY_CARE_PROVIDER_SITE_OTHER): Payer: Medicare Other | Admitting: Ophthalmology

## 2024-02-16 DIAGNOSIS — E113591 Type 2 diabetes mellitus with proliferative diabetic retinopathy without macular edema, right eye: Secondary | ICD-10-CM | POA: Diagnosis not present

## 2024-02-16 DIAGNOSIS — I10 Essential (primary) hypertension: Secondary | ICD-10-CM

## 2024-02-16 DIAGNOSIS — H43813 Vitreous degeneration, bilateral: Secondary | ICD-10-CM | POA: Diagnosis not present

## 2024-02-16 DIAGNOSIS — H35033 Hypertensive retinopathy, bilateral: Secondary | ICD-10-CM

## 2024-02-16 DIAGNOSIS — Z794 Long term (current) use of insulin: Secondary | ICD-10-CM

## 2024-02-16 DIAGNOSIS — H34812 Central retinal vein occlusion, left eye, with macular edema: Secondary | ICD-10-CM

## 2024-02-16 DIAGNOSIS — E113512 Type 2 diabetes mellitus with proliferative diabetic retinopathy with macular edema, left eye: Secondary | ICD-10-CM | POA: Diagnosis not present

## 2024-03-18 NOTE — Telephone Encounter (Signed)
 Marland Kitchen

## 2024-04-05 DIAGNOSIS — F331 Major depressive disorder, recurrent, moderate: Secondary | ICD-10-CM | POA: Diagnosis not present

## 2024-04-09 DIAGNOSIS — H903 Sensorineural hearing loss, bilateral: Secondary | ICD-10-CM | POA: Diagnosis not present

## 2024-04-11 ENCOUNTER — Ambulatory Visit: Payer: Medicare Other | Admitting: Psychiatry

## 2024-04-24 ENCOUNTER — Encounter (INDEPENDENT_AMBULATORY_CARE_PROVIDER_SITE_OTHER): Admitting: Ophthalmology

## 2024-04-24 DIAGNOSIS — H35033 Hypertensive retinopathy, bilateral: Secondary | ICD-10-CM

## 2024-04-24 DIAGNOSIS — I10 Essential (primary) hypertension: Secondary | ICD-10-CM

## 2024-04-24 DIAGNOSIS — E113512 Type 2 diabetes mellitus with proliferative diabetic retinopathy with macular edema, left eye: Secondary | ICD-10-CM

## 2024-04-24 DIAGNOSIS — H26492 Other secondary cataract, left eye: Secondary | ICD-10-CM

## 2024-04-24 DIAGNOSIS — H43813 Vitreous degeneration, bilateral: Secondary | ICD-10-CM | POA: Diagnosis not present

## 2024-04-24 DIAGNOSIS — Z794 Long term (current) use of insulin: Secondary | ICD-10-CM

## 2024-04-24 DIAGNOSIS — H34812 Central retinal vein occlusion, left eye, with macular edema: Secondary | ICD-10-CM

## 2024-04-24 DIAGNOSIS — E113591 Type 2 diabetes mellitus with proliferative diabetic retinopathy without macular edema, right eye: Secondary | ICD-10-CM | POA: Diagnosis not present

## 2024-05-07 ENCOUNTER — Encounter: Payer: Self-pay | Admitting: Psychiatry

## 2024-05-07 ENCOUNTER — Ambulatory Visit: Admitting: Psychiatry

## 2024-05-07 ENCOUNTER — Telehealth: Payer: Self-pay

## 2024-05-07 DIAGNOSIS — F411 Generalized anxiety disorder: Secondary | ICD-10-CM | POA: Diagnosis not present

## 2024-05-07 DIAGNOSIS — G4730 Sleep apnea, unspecified: Secondary | ICD-10-CM

## 2024-05-07 DIAGNOSIS — F341 Dysthymic disorder: Secondary | ICD-10-CM | POA: Diagnosis not present

## 2024-05-07 DIAGNOSIS — F331 Major depressive disorder, recurrent, moderate: Secondary | ICD-10-CM | POA: Diagnosis not present

## 2024-05-07 DIAGNOSIS — G4710 Hypersomnia, unspecified: Secondary | ICD-10-CM

## 2024-05-07 MED ORDER — METHYLPHENIDATE HCL ER (OSM) 36 MG PO TBCR
36.0000 mg | EXTENDED_RELEASE_TABLET | Freq: Every day | ORAL | 0 refills | Status: DC
Start: 2024-05-07 — End: 2024-06-13

## 2024-05-07 NOTE — Progress Notes (Signed)
 Jermaine Waters [id number] Dec 30, 1945 78 y.o.   Subjective:   Patient ID:  Jermaine Waters is a 78 y.o. (DOB 07/03/1946) male.  Chief Complaint:  Chief Complaint  Patient presents with   Follow-up   Depression   Anxiety   Fatigue    Jermaine Waters presents to the office today for follow-up of mood concerns and hypersomnolence.    Patient was seen August 08, 2019.  The assessment was depression.  The following was done: STOP  bupropion  75 mg. Start bupropion  XL 150 mg tablets 1 each morning for 2 weeks. If no side effects such as jitteriness, then increase to 2 tablets each morning which equals 300 mg daily. He continued Ritalin  10 QID.  Patient was seen October 10, 2019.  He had a partial response with Wellbutrin  XL 300 mg daily.  He was tolerating it and therefore the dosage was increased to 450 mg a day. He continued Ritalin  10 mg 4 times daily No Covid problems.   Didn't notice much difference with increase Wellbutrin .  Jermaine thinks he's a little more lethargic.  In some ways a little happier but really struggling with Jermaine.  Pending temporary separation for a few months to try to provide some healing.  Hopefully will help both of them.   Jermaine concerned about Ritalin  Rx for alertness DT OSA and central apnea that even affects him while awake.  PT meds could fall asleep driving.  Ritalin  helps and CPAP helps.  Helps alerness and no insomnia.  Wonders about SE of it. A little more optimistic and less tense.  Therapy helpng also. No SE.  Not jitttery nor edgy.  Jermaine critical that he's less responsive, but maybe I'm less on edge. Energy is fine.  Interests hard to answer bc not a lot of outside activities left DT Covid.  Couple of small groups meeting with Zoom.  Most of free time before was singing but can't do it now. Things changed and wants meds and therapy.  PCP started Wellbutrin  a couple of weeks ago. All my life self-sabotage with jobs and has to restart.  Fear of failure.  Some  worsening health issues dropping productivity.  Jermaine frustrated with him and constantly advising and critical.  Marital stress and unhappiness worse bc together all the time. Plan because of residual depression consideration was given to augment with Abilify.  He was in the process of moving and he felt that perhaps after the move he might have a different view and he wanted to defer.  So no meds were changed.  Appointment Apr 07, 2020, the following was noted: He did get moved.  Still having conflict with his Jermaine which is a chronic stress. Pt reports that mood is Anxious, Depressed and Dysphoric and describes depression and anxiety as mild or mild to moderate but overall improved since last visit.. Anxiety symptoms include: Excessive Worry, avoidant of stress or pressure or commitments.. Pt reports no sleep issues and 7 hours but prefers 8 hours. Pt reports that appetite is good. Pt reports that energy is poor and anhedonia, poor motivation and withdrawn from usual activities. Concentration is down slightly. Suicidal thoughts:  denied by patient. Plan:  No med changes  05/21/2020 appointment the following is noted: Urgent appt at his request. One concern is more anxiety and procrastinates.Jermaine Waters if he's working on the wrong problem.  Jermaine Waters of everything.  ? Fear of failure.  Easily stressed by small demands.  Stress averse and not operating at full capacity.  No history of anxiety meds.   Counseling. Ritalin  10 QID for hypersomnolence but questions ADD bc is distractible.  Sleep apnea for 20 years on CPAP.  Chronically sleepy. Plan: no med changes:  08/26/2020 appointment with the following noted: Increased Wellbutrin  to 450 mg has worked fine. Feeling great.  Only negative is sleepy if sits down to read. Sleepy all his life and long term CPAP user.  Started class 4 year [place]. No trouble with AK since July 20 and no anxiety while she's gone.   Plan: Doing well.  No med  changes  12/31/2020 appointment with following noted: Jermaine joined per patient request Overall OK but somewhat lifeless and disconnected and wonders if med related or not and if changes can be made. Otherwise not anxious.  Not joyful.   Jermaine Waters has different perspective in some areas.  She thinks he's too inactive.  She says since retirement he lacks drive to do much.  Rare exercise. Plan: Reduce the Lexapro  to 5 mg nightly to see if it is having a dulling effect   02/25/2021 appointment with the following noted: No difference noted with less Lexapro .  Overall sort of cloudy and in my own world.  Jermaine CO his memory issues.  Depressed, frustrated, overwhelmed and probably worse after the reduction in meds. Caring for Jermaine with TKR.  Stressful relationship very. Does go out 3-4 nights per week with various groups and some church activity.  Plan: DC Lexapro  and start duloxetine  30 mg for a week then 60 mg daily. Continue Wellbutrin  XL 450 mg daily Continue Ritalin  10 mg 4 times daily  06/01/2021 appointment with the following noted: A little more irritable.  Esp with his Jermaine.  Jermaine complains about him.  He's afraid of saying something that upsets her. Chronic sleepiness is worse he thinks.  Thinks it's med related. Depression is unchanged.   Plan: Wean duloxetine  using 20 mg capsules to 40 mg daily for 2 weeks then 20 mg daily for 2 weeks then stop it in order to prevent withdrawal symptoms.  He will pay attention to see if irritability and alertness are improved. Continue Wellbutrin  XL 450 mg daily Continue Ritalin  10 mg tablets 4 times daily  08/20/2021 appointment with the following noted: At times doesn't think any meds are doing anything and doesn't remember meds helping him.  Wonders if he can get off the Wellbutrin  Neuropsych testing is not suggesting dementia. Still chronic marital problems no better and hostile relationship.  Talking about separation.  Working to sell the  house. Not highly motivated.  Knows he'd feel better separated from Jermaine.  Can find things he enjoys at home.  Thinks he's underachieved in life.  Occ feels hopeless.  Lists of things he needs to do with constant sense of procrastination on things necessary to do. Plan: Wean Wellbutrin  and start Trintellix  5 mg daily for 4 to 7 days and if there is no nausea then increase to 10 mg daily.  09/16/21 appt noted: Just increased Trintellix  to 10 mg daily 2 days ago.  No SE noted. Off Wellbutrin . Signed contract to sell house by end of month and Jermaine gone off deep-end and not functioning and is at home in bed.  She's having more emotional outbursts and "definitely hyped".  He thinks she's hyper and talking constantly and more aggressive, slamming doors etc than usual. Overall he feels stronger and happier with change in meds.  He'd like to contrinue it.   Plan continue  Trintellix  10 mg daily and Ritalin  10 mg 4 times daily  10/20/2021 appointment with the following noted: Trintellix  working feeling stronger, lighter, generally more optimistic.  In complete contrast to Jermaine.  I'm glad I'm on Trintellix . Satisfied with Ritalin .  Missed 4 days and was very tired.  On it 20 years. Overall satisfied with meds. Remains under high stress with Jermaine and at home. No SE. No med changes  01/20/2022 appointment with the following noted: No Still on Trintellix  10 and Ritalin  and making life better. No SE. Better mood has been maintained. No problems with sleep.  Using CPAP Satisfied with Ritalin .  Missed 4 days and was very tired.  On it 20 years. Still sings in group.    09/02/22 appt noted: Ongoing marital stress but maybe some progress.  Overall thinks she's still depressed. This causes him to wrestle too but counseling creates some issues.  Joint sessions with Benedetto Brady are helping.   Continues Trintellix  10 and Ritalin  10 QID. Compliant without a problem.   Continues singing. Normally 6-7 hours  of sleep but even if 9 hours falls asleep reading. CPAP working properly.  Consistent with it. Mood about the same as Nov 2022.   No SE.   Plan: cont Trintellix  10 Start one half of 75 mg tablet Sunosi  in the morning for 3 days, Then increase Sunosi  to 175 mg tablet in the morning for 1 week, Then if needed increase Sunosi  to 1-1/2 of the 75 mg tablets in the morning for 1 week, Then if needed increase Sunosi  to the 150 mg tablet If it is helpful reduce Ritalin  to 1/2 tablet 4 times daily for 2 week s, Then stop Ritalin   11/23/22 appt noted: Sunosi  helpful and is more awake and it really works. More motivated to keep moving.  Gotten into habit or trying to go to bed reasonable hour unless gets stuck on computer on phone.  Can find endless distractions.  Situation is amplifying that. No SE. Still taking Ritalin . Tendency to be very withdrawn.   Chronic tension with Jermaine and tends to avoid her bc her behavior.   Stilll using CPAP.   Seems to be anxious.  Like my world is collapsing .  Busy beating himself up on what he's not doing and W  jumps on board to be critical.   She is out of control with criticising him over trivial matters.  Criticism is unending.  Plan: continue Trintellix  10  Conintue bc benefit Sunosi  to the 150 mg tablet Rec trial reduce Ritalin  to 1/2 tablet 4 times daily for 2 week s, Then stop Ritalin .  He's getting from PCP [facility].  02/21/23 appt noted: Got off Ritalin . For over a month.  A little shakey at first but got over it.  Had to stop abruptly Dt shortage and out of town.   I feel better psychologically off it.  Thinks he was a prisoner to Ritalin  and Sunosi  freed him from that.   Wonders about weaning off Trintellix .  Trying to wean for sex activity if possible.   Some ED and tried them in the past. 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 Doing [employer] training and enjoying it a lot.  Teaching him a lot.  Disc risks weaning Trintellix  and  will start after tax season. Anxiety is better than it was. Plan: Conintue bc benefit Sunosi  to the 150 mg tablet Successful off Ritalin  Okay to try to wean off Trintellix  after tax season  per his request.  Discussed the risk of relapse.  04/20/23 appt noted: Right now mood is good and feels satisfied with things except marriage.  Says even others , friends notice Jermaine's anger problems.   When tried higher dose Trintellix  didn't see benefit and had vague SE.  Seemed like it was too high.  But didn't stop it.   Current psych meds: Trintellix  10, sunosi  150 AM No current SE.   Overall feels more "stable" on Sunosi  vs Ritalin .  Easier to take with less worrying about how it affects him.  Generally prefers to be on less med.   Has had low periods of depression .   Enjoys barbershop singing.  Active.   Plan no changes.  05/18/23 appt noted: Still doing well with meds.   Rare good night sleep.  Always feels he hasn't accomplished enough and has tendency to stay up too late at night doing tasks. Plan: no med changes  08/19/23 appt noted: Generally pretty good.  Not markedly dep.    Meds as above.  Feels they are both helping. Looked up SE Wonders if Sunosi  causing anxiety.  It is helping some but not resolved things.  Doesn't want to return to [facility] bc SE Has central and Obstructive sleep apnea.  Uses CPAP Can still fall asleep in day even with good night's sleep.  Will have apneic events napping in the chair.   Volunteering more and very busy.  Started [employer] and active.   Going well.   Home life no better.  Jermaine critical and negative. Jermaine says he's too easily distracted.  Like if doing task and goes by TV then gets distracted by the TV. Plan: Continue bc benefit Sunosi  to the 150 mg tablet BUT reduce to 112.5 mg to see if anxiety is better See if anxiety is better or not and then reevaluate Jermaine's concerns about is inattention.   10/06/23 appt noted: Psych med;  Trintellix  10, Sunosi  reduced to about 100 mg  SE minimal. Thinks he is doing well overall.  More alert driving with the car with it.  Switched directly from MPH to Sunosi  wihtout change in alertness.  Not as energetic and hyper with Sunosi .  Les anxiety with lower dose. Mood is pretty good.  Generally doing better.  More engaged and active.   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 To [place] Jan for competition.   He wants the least amount med and asks about reducing doses.   Plan: Reduce Trintellix  5 mg daily and if well at FU will stop it. .  This is bc of the benefit of Sunosi  might be sufficient for mood.    02/01/24 appt noted: Med: Trintellix  10, Sunosi  150 Went 3-4 weeks into Trintellix  5 and more dep and increased back to 10 mg dialy. Feels pretty good now.  Increased Sunosi  150. Busy and not always as much sleep.  When good night feels great.  Avg 5-8 hour.   Chronically feels behind and stays up too late doing things. Not yawning all day with Sunosi .  Less napping and more alert with Sunosi .  Used to be so drowsy he'd fall asleep at stop lights. Sunosi  is easier to take than Ritalin .  05/07/24 appt noted: Med: stopped Trintellix  10, Sunosi  150 Stopped Trintellix  a couple of weeks ago.  Feels dull and foggy about everything. No change in mood so far.  Feels a little more alert and responsive off Trintellix .   Not very good  a getting a lot of sleep.  Feels like he is behind and stays up too late.  Avg 6+ hours but needs more.   Concern over cost Sunosi  and asks abotu Concerta .   Cecilia still bouncing off the walls.  Hyper and critical.  She blames him for everything.  She  co severe back pain.   Drinking daily but secretive.    Past Psychiatric Medication Trials:  History Jermaine Waters.  On and off antidepressants for 20 years  Doesn't remember any being helpful.  lexapro  10  Trintellix  10, 15 mg not more helpful and vague SE Remote Wellbutrin  450.   Doesn't  remember the names of prior meds but remembers sexual SE with one. History of Nuvigil for hypersomnolence but he does not recall the response but apparently not good enough  so Ritalin  was RX in it's place Sunosi  150 benefit cost issues  Review of Systems:  Review of Systems  Constitutional:  Positive for fatigue.  Eyes:  Positive for visual disturbance.  Cardiovascular:  Negative for chest pain and palpitations.  Gastrointestinal:  Negative for nausea.  Musculoskeletal:  Positive for arthralgias.  Neurological:  Negative for tremors.  Psychiatric/Behavioral:  Positive for decreased concentration. Negative for dysphoric mood. The patient is not nervous/anxious.     Medications: I have reviewed the patient's current medications.  Current Outpatient Medications  Medication Sig Dispense Refill   CONTOUR NEXT TEST test strip      methylphenidate  (CONCERTA ) 36 MG PO CR tablet Take 1 tablet (36 mg total) by mouth daily. 30 tablet 0   Multiple Vitamins-Minerals (PRESERVISION AREDS 2) CAPS See admin instructions.     NOVOLOG 100 UNIT/ML injection U IN INSULIN  PUMP .APPROXIMATELY 80 UNITS A DAY  6   PROLENSA 0.07 % SOLN INT 1 GTT IN OS HS  6   ramipril (ALTACE) 5 MG capsule Take 5 mg by mouth 2 (two) times daily.     tadalafil  (CIALIS ) 20 MG tablet TAKE 1 TABLET(20 MG) BY MOUTH DAILY AS NEEDED FOR ERECTILE DYSFUNCTION 15 tablet 1   ciclopirox  (PENLAC ) 8 % solution Apply topically at bedtime. Apply over nail and surrounding skin. Apply daily over previous coat. After seven (7) days, may remove with alcohol  and continue cycle. (Patient not taking: Reported on 05/07/2024) 6.6 mL 2   TRINTELLIX  10 MG TABS tablet TAKE 1 TABLET DAILY (Patient not taking: Reported on 05/07/2024) 90 tablet 1   No current facility-administered medications for this visit.    Medication Side Effects: None  Allergies:  Allergies  Allergen Reactions   Pravastatin Other (See Comments)    Past Medical History:   Diagnosis Date   Anxiety    Arthritis    Cancer (HCC)    skin CA removed   Depression    Diabetes mellitus    Diabetes mellitus without complication (HCC)    Type 1   Elevated PSA 11/2014   PCP ordered for patient to take Cipro for 21 days   Erectile dysfunction    Hypercholesteremia    Skin cancer of face    Sleep apnea    wears CPAP nightly    Family History  Problem Relation Age of Onset   Cancer Father        Esophageal/gastric cancer   Cancer Mother        Metastatic cancer   Rheum arthritis Mother    Colon cancer Neg Hx    Pancreatic cancer Neg Hx    Stomach cancer Neg Hx  Social History   Socioeconomic History   Marital status: Married    Spouse name: Not on file   Number of children: 2   Years of education: Not on file   Highest education level: Not on file  Occupational History   Occupation: Retired    Associate Professor: [employer]  Tobacco Use   Smoking status: Former    Current packs/day: 0.00    Average packs/day: 2.0 packs/day for 15.0 years (30.0 ttl pk-yrs)    Types: Cigarettes    Start date: 27    Quit date: 62    Years since quitting: 44.4   Smokeless tobacco: Never  Substance and Sexual Activity   Alcohol  use: No    Comment: 1986   Drug use: No   Sexual activity: Not on file  Other Topics Concern   Not on file  Social History Narrative    Merged History Encounter        Social Drivers of Health   Financial Resource Strain: Not on file  Food Insecurity: Not on file  Transportation Needs: Not on file  Physical Activity: Not on file  Stress: Not on file  Social Connections: Not on file  Intimate Partner Violence: Not on file    Past Medical History, Surgical history, Social history, and Family history were reviewed and updated as appropriate.   Please see review of systems for further details on the patient's review from today.   Objective:   Physical Exam:  There were no vitals taken for this visit.  Physical  Exam Constitutional:      General: He is not in acute distress. Musculoskeletal:        General: No deformity.  Neurological:     Mental Status: He is alert and oriented to person, place, and time.     Cranial Nerves: No dysarthria.     Coordination: Coordination normal.     Gait: Gait normal.  Psychiatric:        Attention and Perception: Attention and perception normal. He does not perceive auditory or visual hallucinations.        Mood and Affect: Mood is anxious. Mood is not depressed. Affect is not labile or blunt.        Speech: Speech normal.        Behavior: Behavior normal. Behavior is not slowed. Behavior is cooperative.        Thought Content: Thought content normal. Thought content is not paranoid or delusional. Thought content does not include homicidal or suicidal ideation. Thought content does not include suicidal plan.        Cognition and Memory: Cognition and memory normal.        Judgment: Judgment normal.     Comments: Insight intact Sx much better and Jermaine Waters's ministry work helps. But residual anxiety Not much dep after inreasing Trintellix      Lab Review:     Component Value Date/Time   NA 139 12/02/2014 0842   K 4.3 12/02/2014 0842   CL 106 12/02/2014 0842   CO2 25 12/02/2014 0842   GLUCOSE 223 (H) 12/02/2014 0842   BUN 24 (H) 12/02/2014 0842   CREATININE 0.87 12/02/2014 0842   CALCIUM  8.8 12/02/2014 0842   GFRNONAA 88 (L) 12/02/2014 0842   GFRAA >90 12/02/2014 0842       Component Value Date/Time   WBC 4.9 12/02/2014 0843   RBC 4.90 12/02/2014 0843   HGB 14.8 12/02/2014 0843   HCT 44.1 12/02/2014 0843   PLT  226 12/02/2014 0843   MCV 90.0 12/02/2014 0843   MCH 30.2 12/02/2014 0843   MCHC 33.6 12/02/2014 0843   RDW 13.1 12/02/2014 0843    No results found for: "POCLITH", "LITHIUM"   No results found for: "PHENYTOIN", "PHENOBARB", "VALPROATE", "CBMZ"   .res Assessment: Plan:    Jermaine Waters "Jermaine Waters" was seen today for follow-up, depression,  anxiety and fatigue.  Diagnoses and all orders for this visit:  Major depressive disorder, recurrent episode, moderate (HCC)  Generalized anxiety disorder  Early onset dysthymia  Sleep apnea with hypersomnolence -     methylphenidate  (CONCERTA ) 36 MG PO CR tablet; Take 1 tablet (36 mg total) by mouth daily.   30 min  We discussed When first seen he denied significant depressive symptoms although his Jermaine thought he was depressed.    He realizes he also needs therapy for this tendency to self sabotage and also for marital conflict that has resulted.  Ongoing conflict with his Jermaine. Overall feels stronger and more confident. Sx much better and [employer] training helps.  Encourage activity in retirement.  Disc examples like classes, [facility].  Disc poss separation to diminish hostility in the relationship, which in turn might help his depression bc she is highly critical of him.  Disc concerns about Jermaine who is a pt.  She's moving to fast and angry.    Disc sleep hygiene and need to get enough sleep.    He prefers trial off  Trintellix  10 despite prior relapse off it..  Generally good response and no SE. Disc option of increasing the dose to see if more motivation and activity. Disc SE .   Disc risk off AD.  Option trial Auvelity, resume Wellbutrin , Pristiq.  Hypersomnolence predates antidepressants and failed to respond to armodafinil but is improved though not resolved with Ritalin  10 mg 4 times daily but even better with Sunosi  than Ritalin  Though benefit Sunosi   150 mg switch to Concerta  36 AM DT cost Call if need to increase to 54  Cialis  10-20 mg prn.   Disc se and dosing range.  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 Still enjoys that.  Has been in counseling.     FU 3-4 months  Nori Beat, MD, DFAPA   Future Appointments  Date Time Provider Department Center  06/19/2024  3:20 PM Reynold Caves,  MD [facility] None  06/26/2024  7:30 AM Rexene Catching, MD [facility] None  07/12/2024  9:30 AM Cottle, Kennedy Peabody., MD [facility] None  07/31/2024  9:00 AM Cottle, Kennedy Peabody., MD [facility] None  10/11/2024  3:45 PM Johny Nap, NP [facility] None    No orders of the defined types were placed in this encounter.    -------------------------------

## 2024-05-07 NOTE — Telephone Encounter (Signed)
 Prior authorization submitted and approved for Methylphenidate  ER 36 mg with [employer] effective 02/07/24-05/07/25

## 2024-05-08 DIAGNOSIS — R972 Elevated prostate specific antigen [PSA]: Secondary | ICD-10-CM | POA: Diagnosis not present

## 2024-05-10 DIAGNOSIS — I10 Essential (primary) hypertension: Secondary | ICD-10-CM | POA: Diagnosis not present

## 2024-05-10 DIAGNOSIS — G4733 Obstructive sleep apnea (adult) (pediatric): Secondary | ICD-10-CM | POA: Diagnosis not present

## 2024-05-10 DIAGNOSIS — R972 Elevated prostate specific antigen [PSA]: Secondary | ICD-10-CM | POA: Diagnosis not present

## 2024-05-10 DIAGNOSIS — E785 Hyperlipidemia, unspecified: Secondary | ICD-10-CM | POA: Diagnosis not present

## 2024-05-10 DIAGNOSIS — I739 Peripheral vascular disease, unspecified: Secondary | ICD-10-CM | POA: Diagnosis not present

## 2024-05-10 DIAGNOSIS — E10319 Type 1 diabetes mellitus with unspecified diabetic retinopathy without macular edema: Secondary | ICD-10-CM | POA: Diagnosis not present

## 2024-05-10 DIAGNOSIS — E1051 Type 1 diabetes mellitus with diabetic peripheral angiopathy without gangrene: Secondary | ICD-10-CM | POA: Diagnosis not present

## 2024-05-10 DIAGNOSIS — F900 Attention-deficit hyperactivity disorder, predominantly inattentive type: Secondary | ICD-10-CM | POA: Diagnosis not present

## 2024-05-10 DIAGNOSIS — G720 Drug-induced myopathy: Secondary | ICD-10-CM | POA: Diagnosis not present

## 2024-05-15 DIAGNOSIS — R972 Elevated prostate specific antigen [PSA]: Secondary | ICD-10-CM | POA: Diagnosis not present

## 2024-05-17 DIAGNOSIS — L821 Other seborrheic keratosis: Secondary | ICD-10-CM | POA: Diagnosis not present

## 2024-05-17 DIAGNOSIS — L570 Actinic keratosis: Secondary | ICD-10-CM | POA: Diagnosis not present

## 2024-05-17 DIAGNOSIS — L723 Sebaceous cyst: Secondary | ICD-10-CM | POA: Diagnosis not present

## 2024-05-17 DIAGNOSIS — D2262 Melanocytic nevi of left upper limb, including shoulder: Secondary | ICD-10-CM | POA: Diagnosis not present

## 2024-05-17 DIAGNOSIS — Z85828 Personal history of other malignant neoplasm of skin: Secondary | ICD-10-CM | POA: Diagnosis not present

## 2024-06-13 ENCOUNTER — Other Ambulatory Visit: Payer: Self-pay

## 2024-06-13 ENCOUNTER — Telehealth: Payer: Self-pay | Admitting: Psychiatry

## 2024-06-13 DIAGNOSIS — G4710 Hypersomnia, unspecified: Secondary | ICD-10-CM

## 2024-06-13 MED ORDER — METHYLPHENIDATE HCL ER (OSM) 36 MG PO TBCR: 36.0000 mg | EXTENDED_RELEASE_TABLET | Freq: Every day | ORAL | 0 refills | Status: DC

## 2024-06-13 NOTE — Telephone Encounter (Signed)
 Patient called in for refill on Methylphenidate  36mg . Ph: [phone] Appt 8/7 Pharmacy [facility] [id number] [place], [place]

## 2024-06-13 NOTE — Telephone Encounter (Signed)
 Pended.

## 2024-06-19 ENCOUNTER — Ambulatory Visit (INDEPENDENT_AMBULATORY_CARE_PROVIDER_SITE_OTHER): Admitting: Otolaryngology

## 2024-06-19 ENCOUNTER — Encounter (INDEPENDENT_AMBULATORY_CARE_PROVIDER_SITE_OTHER): Payer: Self-pay | Admitting: Otolaryngology

## 2024-06-19 VITALS — BP 156/66 | HR 77

## 2024-06-19 DIAGNOSIS — H903 Sensorineural hearing loss, bilateral: Secondary | ICD-10-CM

## 2024-06-20 ENCOUNTER — Encounter (INDEPENDENT_AMBULATORY_CARE_PROVIDER_SITE_OTHER): Admitting: Ophthalmology

## 2024-06-20 DIAGNOSIS — E113512 Type 2 diabetes mellitus with proliferative diabetic retinopathy with macular edema, left eye: Secondary | ICD-10-CM | POA: Diagnosis not present

## 2024-06-20 DIAGNOSIS — H43813 Vitreous degeneration, bilateral: Secondary | ICD-10-CM

## 2024-06-20 DIAGNOSIS — E113591 Type 2 diabetes mellitus with proliferative diabetic retinopathy without macular edema, right eye: Secondary | ICD-10-CM | POA: Diagnosis not present

## 2024-06-20 DIAGNOSIS — H34812 Central retinal vein occlusion, left eye, with macular edema: Secondary | ICD-10-CM

## 2024-06-20 DIAGNOSIS — H35033 Hypertensive retinopathy, bilateral: Secondary | ICD-10-CM | POA: Diagnosis not present

## 2024-06-20 DIAGNOSIS — H903 Sensorineural hearing loss, bilateral: Secondary | ICD-10-CM | POA: Insufficient documentation

## 2024-06-20 DIAGNOSIS — I10 Essential (primary) hypertension: Secondary | ICD-10-CM | POA: Diagnosis not present

## 2024-06-20 DIAGNOSIS — Z794 Long term (current) use of insulin: Secondary | ICD-10-CM

## 2024-06-20 NOTE — Progress Notes (Signed)
 CC: Asymmetric hearing loss  HPI:  Jermaine Waters is a 78 y.o. male who presents today for evaluation of his asymmetric hearing loss.  According to the patient, he has noted progressive hearing difficulty over several years.  He was recently evaluated at [facility] audiology.  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  Due to the significant asymmetry, the patient was referred for further evaluation.  The patient has no previous otologic surgery.  He denies any recent otitis media or otitis externa.  Currently he denies any otalgia, otorrhea, vertigo, or tinnitus.  He has a history of septoplasty.  Past Medical History:  Diagnosis Date   Anxiety    Arthritis    Cancer (HCC)    skin CA removed   Depression    Diabetes mellitus    Diabetes mellitus without complication (HCC)    Type 1   Elevated PSA 11/2014   PCP ordered for patient to take Cipro for 21 days   Erectile dysfunction    Hypercholesteremia    Skin cancer of face    Sleep apnea    wears CPAP nightly    Past Surgical History:  Procedure Laterality Date   APPENDECTOMY     CHOLECYSTECTOMY     COLONOSCOPY     EYE SURGERY Bilateral    laser   ROTATOR CUFF REPAIR Right    SEPTOPLASTY Bilateral 12/11/2014   Procedure: NASAL BILATERAL SEPTOPLASTY;  Surgeon: Alm Bouche, MD;  Location: [facility] OR;  Service: ENT;  Laterality: Bilateral;   SHOULDER ARTHROSCOPY WITH ROTATOR CUFF REPAIR AND SUBACROMIAL DECOMPRESSION Right 04/05/2013   Procedure: RIGHT SHOULDER ARTHROSCOPY SUBACROMIAL DECOMPRESSION DISTAL CLAVICLE RESECTION AND ROTATOR CUFF REPAIR ;  Surgeon: Franky CHRISTELLA Pointer, MD;  Location: [facility] OR;  Service: Orthopedics;  Laterality: Right;   TONSILLECTOMY     TURBINATE REDUCTION N/A 12/11/2014   Procedure: INFERIOR TURBINATE REDUCTION;  Surgeon: Alm Bouche, MD;  Location: [facility] OR;  Service: ENT;  Laterality: N/A;    Family History  Problem Relation Age of Onset   Cancer Father         Esophageal/gastric cancer   Cancer Mother        Metastatic cancer   Rheum arthritis Mother    Colon cancer Neg Hx    Pancreatic cancer Neg Hx    Stomach cancer Neg Hx     Social History:  reports that he quit smoking about 44 years ago. His smoking use included cigarettes. He started smoking about 59 years ago. He has a 30 pack-year smoking history. He has never used smokeless tobacco. He reports that he does not drink alcohol  and does not use drugs.  Allergies:  Allergies  Allergen Reactions   Pravastatin Other (See Comments)    Prior to Admission medications   Medication Sig Start Date End Date Taking? Authorizing Provider  ciclopirox  (PENLAC ) 8 % solution Apply topically at bedtime. Apply over nail and surrounding skin. Apply daily over previous coat. After seven (7) days, may remove with alcohol  and continue cycle. 10/09/20  Yes Silva Juliene SAUNDERS, DPM  CONTOUR NEXT TEST test strip  09/26/19  Yes [provider]  methylphenidate  (CONCERTA ) 36 MG PO CR tablet Take 1 tablet (36 mg total) by mouth daily. 06/13/24  Yes Cottle, Lorene KANDICE Raddle., MD  Multiple Vitamins-Minerals (PRESERVISION AREDS 2) CAPS See admin instructions.   Yes [provider]  NOVOLOG 100 UNIT/ML injection U IN INSULIN  PUMP .APPROXIMATELY 80 UNITS A DAY 12/19/17  Yes [provider]  PROLENSA 0.07 % SOLN INT 1 GTT IN OS HS 08/08/18  Yes [provider]  ramipril (ALTACE) 5 MG capsule Take 5 mg by mouth 2 (two) times daily.   Yes [provider]  tadalafil  (CIALIS ) 20 MG tablet TAKE 1 TABLET(20 MG) BY MOUTH DAILY AS NEEDED FOR ERECTILE DYSFUNCTION 11/21/23  Yes Cottle, Lorene KANDICE Raddle., MD  TRINTELLIX  10 MG TABS tablet TAKE 1 TABLET DAILY 01/26/24  Yes Cottle, Lorene KANDICE Raddle., MD    Blood pressure (!) 156/66, pulse 77, SpO2 96%. Exam: General: Communicates without difficulty, well nourished, no acute distress. Head: Normocephalic, no evidence injury, no tenderness, facial buttresses intact  without stepoff. Face/sinus: No tenderness to palpation and percussion. Facial movement is normal and symmetric. Eyes: PERRL, EOMI. No scleral icterus, conjunctivae clear. Neuro: CN II exam reveals vision grossly intact.  No nystagmus at any point of gaze. Ears: Auricles well formed without lesions.  Ear canals are intact without mass or lesion.  No erythema or edema is appreciated.  The TMs are intact without fluid. Nose: External evaluation reveals normal support and skin without lesions.  Dorsum is intact.  Anterior rhinoscopy reveals normal mucosa over anterior aspect of inferior turbinates and intact septum.  No purulence noted. Oral:  Oral cavity and oropharynx are intact, symmetric, without erythema or edema.  Mucosa is moist without lesions. Neck: Full range of motion without pain.  There is no significant lymphadenopathy.  No masses palpable.  Thyroid  bed within normal limits to palpation.  Parotid glands and submandibular glands equal bilaterally without mass.  Trachea is midline. Neuro:  CN 2-12 grossly intact.   Assessment: 1.  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  His hearing test results from [facility] are reviewed. 2.  His ear canals, tympanic membranes, and middle ear spaces are normal. 3.  The rest of his ENT exam is also normal.  Plan: 1.  The physical exam findings and the hearing test results were reviewed with the patient. 2.  The small possibility of a retrocochlear lesion causing the asymmetric hearing loss is discussed.  The options of conservative observation versus MRI scan are reviewed.  The patient would like to proceed with the MRI scan. 3.  The patient is a candidate for hearing amplification. 4.  The patient will return for reevaluation in 1 year, sooner if abnormalities noted on his MRI scan.  Brynli Ollis W [place] Behlke 06/20/2024, 10:25 AM

## 2024-06-23 ENCOUNTER — Ambulatory Visit (HOSPITAL_COMMUNITY)
Admission: RE | Admit: 2024-06-23 | Discharge: 2024-06-23 | Disposition: A | Discharge status: Home / Self Care | Source: Ambulatory Visit | Attending: Otolaryngology | Admitting: Otolaryngology

## 2024-06-23 DIAGNOSIS — I6782 Cerebral ischemia: Secondary | ICD-10-CM | POA: Diagnosis not present

## 2024-06-23 DIAGNOSIS — H903 Sensorineural hearing loss, bilateral: Secondary | ICD-10-CM | POA: Diagnosis not present

## 2024-06-23 DIAGNOSIS — Q048 Other specified congenital malformations of brain: Secondary | ICD-10-CM | POA: Diagnosis not present

## 2024-06-23 DIAGNOSIS — H905 Unspecified sensorineural hearing loss: Secondary | ICD-10-CM | POA: Diagnosis not present

## 2024-06-23 MED ORDER — GADOBUTROL 1 MMOL/ML IV SOLN
7.0000 mL | Freq: Once | INTRAVENOUS | Status: AC | PRN
  Administered 2024-06-23: 7 mL via INTRAVENOUS

## 2024-06-26 ENCOUNTER — Encounter (INDEPENDENT_AMBULATORY_CARE_PROVIDER_SITE_OTHER): Admitting: Ophthalmology

## 2024-06-28 ENCOUNTER — Other Ambulatory Visit (INDEPENDENT_AMBULATORY_CARE_PROVIDER_SITE_OTHER): Payer: Self-pay | Admitting: Otolaryngology

## 2024-06-28 DIAGNOSIS — I609 Nontraumatic subarachnoid hemorrhage, unspecified: Secondary | ICD-10-CM

## 2024-06-29 ENCOUNTER — Telehealth (INDEPENDENT_AMBULATORY_CARE_PROVIDER_SITE_OTHER): Payer: Self-pay | Admitting: Otolaryngology

## 2024-06-29 NOTE — Telephone Encounter (Signed)
 Patient called in extremely concerned about MRI results.  States he would like a call as soon as possible regarding results.  Please advise.

## 2024-07-04 ENCOUNTER — Encounter: Payer: Self-pay | Admitting: Neuroradiology

## 2024-07-04 ENCOUNTER — Ambulatory Visit (INDEPENDENT_AMBULATORY_CARE_PROVIDER_SITE_OTHER): Admitting: Neuroradiology

## 2024-07-04 VITALS — BP 133/61 | HR 79 | Ht 65.0 in | Wt 172.0 lb

## 2024-07-04 DIAGNOSIS — G9389 Other specified disorders of brain: Secondary | ICD-10-CM

## 2024-07-04 DIAGNOSIS — G9689 Other specified disorders of central nervous system: Secondary | ICD-10-CM

## 2024-07-04 NOTE — Progress Notes (Signed)
 I had the pleasure of meeting Jermaine Waters in the office today for discussion of an abnormality detected on her brain MRI 06/23/2024.  He had the MRI due to bilateral hearing loss, asymmetric and worse on the left.  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  I personally reviewed both studies.  He does not suffer from headaches.  He does not recall any headaches really, but certainly not a thunderclap headache suspicious for subarachnoid hemorrhage.  There is no history of trauma.  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  His MRI does show some mild nonspecific white matter abnormalities, but no other significant abnormality.  He does not take any antithrombotic medications.  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  I have explained the diagnosis.  I have explained that this finding of superficial siderosis can be seen after aneurysmal subarachnoid hemorrhage, but neither his history nor the pattern of bleeding suggest that diagnosis.  I do not think that further vascular imaging is likely to reveal a cause.  I did offer to get a CT arteriogram if he wanted to be 100% certain that there is no aneurysm, but I have explained that I do not think the test is necessary given his absence of symptoms and the pattern of the abnormality which does not suggest aneurysmal bleeding.  Plan:  Both he and I are comfortable with no further evaluation.  No routine imaging follow-up needed.  I am glad to see him again at any time as needed.

## 2024-07-09 ENCOUNTER — Encounter (INDEPENDENT_AMBULATORY_CARE_PROVIDER_SITE_OTHER): Admitting: Ophthalmology

## 2024-07-12 ENCOUNTER — Encounter: Payer: Self-pay | Admitting: Psychiatry

## 2024-07-12 ENCOUNTER — Ambulatory Visit: Admitting: Psychiatry

## 2024-07-12 DIAGNOSIS — F325 Major depressive disorder, single episode, in full remission: Secondary | ICD-10-CM | POA: Diagnosis not present

## 2024-07-12 DIAGNOSIS — G4730 Sleep apnea, unspecified: Secondary | ICD-10-CM | POA: Diagnosis not present

## 2024-07-12 DIAGNOSIS — G4710 Hypersomnia, unspecified: Secondary | ICD-10-CM

## 2024-07-12 MED ORDER — METHYLPHENIDATE HCL ER (OSM) 36 MG PO TBCR: 36.0000 mg | EXTENDED_RELEASE_TABLET | Freq: Every day | ORAL | 0 refills | Status: DC

## 2024-07-12 NOTE — Progress Notes (Signed)
 Jermaine Waters [id number] [birth year] 78 y.o.  Virtual Visit via Telephone Note  I connected with pt by telephone and verified that I am speaking with the correct person using two identifiers.   I discussed the limitations, risks, security and privacy concerns of performing an evaluation and management service by telephone and the availability of in person appointments. I also discussed with the patient that there may be a patient responsible charge related to this service. The patient expressed understanding and agreed to proceed.  I discussed the assessment and treatment plan with the patient. The patient was provided an opportunity to ask questions and all were answered. The patient agreed with the plan and demonstrated an understanding of the instructions.   The patient was advised to call back or seek an in-person evaluation if the symptoms worsen or if the condition fails to improve as anticipated.  I provided 15  minutes of non-face-to-face time during this encounter. The call started at 1000 and ended at 1015. The patient was located at home and the provider was located office.   Subjective:   Patient ID:  Jermaine Waters is a 78 y.o. (DOB Aug 14, 1946) male.  Chief Complaint:  Chief Complaint  Patient presents with   Follow-up   Depression   Anxiety    Jermaine Waters presents to the office today for follow-up of mood concerns and hypersomnolence.    Patient was seen August 08, 2019.  The assessment was depression.  The following was done: STOP  bupropion  75 mg. Start bupropion  XL 150 mg tablets 1 each morning for 2 weeks. If no side effects such as jitteriness, then increase to 2 tablets each morning which equals 300 mg daily. He continued Ritalin  10 QID.  Patient was seen October 10, 2019.  He had a partial response with Wellbutrin  XL 300 mg daily.  He was tolerating it and therefore the dosage was increased to 450 mg a day. He continued Ritalin  10 mg 4 times daily No  Covid problems.   Didn't notice much difference with increase Wellbutrin .  Wife thinks he's a little more lethargic.  In some ways a little happier but really struggling with wife.  Pending temporary separation for a few months to try to provide some healing.  Hopefully will help both of them.   Wife concerned about Ritalin  Rx for alertness DT OSA and central apnea that even affects him while awake.  PT meds could fall asleep driving.  Ritalin  helps and CPAP helps.  Helps alerness and no insomnia.  Wonders about SE of it. A little more optimistic and less tense.  Therapy helpng also. No SE.  Not jitttery nor edgy.  Wife critical that he's less responsive, but maybe I'm less on edge. Energy is fine.  Interests hard to answer bc not a lot of outside activities left DT Covid.  Couple of small groups meeting with Zoom.  Most of free time before was singing but can't do it now. Things changed and wants meds and therapy.  PCP started Wellbutrin  a couple of weeks ago. All my life self-sabotage with jobs and has to restart.  Fear of failure.  Some worsening health issues dropping productivity.  Wife frustrated with him and constantly advising and critical.  Marital stress and unhappiness worse bc together all the time. Plan because of residual depression consideration was given to augment with Abilify.  He was in the process of moving and he felt that perhaps after the move he might have a different view and  he wanted to defer.  So no meds were changed.  Appointment Apr 07, 2020, the following was noted: He did get moved.  Still having conflict with his wife which is a chronic stress. Pt reports that mood is Anxious, Depressed and Dysphoric and describes depression and anxiety as mild or mild to moderate but overall improved since last visit.. Anxiety symptoms include: Excessive Worry, avoidant of stress or pressure or commitments.. Pt reports no sleep issues and 7 hours but prefers 8 hours. Pt reports that  appetite is good. Pt reports that energy is poor and anhedonia, poor motivation and withdrawn from usual activities. Concentration is down slightly. Suicidal thoughts:  denied by patient. Plan:  No med changes  05/21/2020 appointment the following is noted: Urgent appt at his request. One concern is more anxiety and procrastinates.SABRA Ped if he's working on the wrong problem.  Bearl of everything.  ? Fear of failure.  Easily stressed by small demands.  Stress averse and not operating at full capacity. No history of anxiety meds.   Counseling. Ritalin  10 QID for hypersomnolence but questions ADD bc is distractible.  Sleep apnea for 20 years on CPAP.  Chronically sleepy. Plan: no med changes:  08/26/2020 appointment with the following noted: Increased Wellbutrin  to 450 mg has worked fine. Feeling great.  Only negative is sleepy if sits down to read. Sleepy all his life and long term CPAP user.  Started class 4 year [place]. No trouble with AK since July 20 and no anxiety while she's gone.   Plan: Doing well.  No med changes  12/31/2020 appointment with following noted: Wife joined per patient request Overall OK but somewhat lifeless and disconnected and wonders if med related or not and if changes can be made. Otherwise not anxious.  Not joyful.   Wife Jermaine Waters has different perspective in some areas.  She thinks he's too inactive.  She says since retirement he lacks drive to do much.  Rare exercise. Plan: Reduce the Lexapro  to 5 mg nightly to see if it is having a dulling effect   02/25/2021 appointment with the following noted: No difference noted with less Lexapro .  Overall sort of cloudy and in my own world.  Wife CO his memory issues.  Depressed, frustrated, overwhelmed and probably worse after the reduction in meds. Caring for wife with TKR.  Stressful relationship very. Does go out 3-4 nights per week with various groups and some church activity.  Plan: DC Lexapro   and start duloxetine  30 mg for a week then 60 mg daily. Continue Wellbutrin  XL 450 mg daily Continue Ritalin  10 mg 4 times daily  06/01/2021 appointment with the following noted: A little more irritable.  Esp with his wife.  Wife complains about him.  He's afraid of saying something that upsets her. Chronic sleepiness is worse he thinks.  Thinks it's med related. Depression is unchanged.   Plan: Wean duloxetine  using 20 mg capsules to 40 mg daily for 2 weeks then 20 mg daily for 2 weeks then stop it in order to prevent withdrawal symptoms.  He will pay attention to see if irritability and alertness are improved. Continue Wellbutrin  XL 450 mg daily Continue Ritalin  10 mg tablets 4 times daily  08/20/2021 appointment with the following noted: At times doesn't think any meds are doing anything and doesn't remember meds helping him.  Wonders if he can get off the Wellbutrin  Neuropsych testing is not suggesting dementia. Still chronic marital problems no better and  hostile relationship.  Talking about separation.  Working to sell the house. Not highly motivated.  Knows he'd feel better separated from wife.  Can find things he enjoys at home.  Thinks he's underachieved in life.  Occ feels hopeless.  Lists of things he needs to do with constant sense of procrastination on things necessary to do. Plan: Wean Wellbutrin  and start Trintellix  5 mg daily for 4 to 7 days and if there is no nausea then increase to 10 mg daily.  09/16/21 appt noted: Just increased Trintellix  to 10 mg daily 2 days ago.  No SE noted. Off Wellbutrin . Signed contract to sell house by end of month and wife gone off deep-end and not functioning and is at home in bed.  She's having more emotional outbursts and definitely hyped.  He thinks she's hyper and talking constantly and more aggressive, slamming doors etc than usual. Overall he feels stronger and happier with change in meds.  He'd like to contrinue it.   Plan continue  Trintellix  10 mg daily and Ritalin  10 mg 4 times daily  10/20/2021 appointment with the following noted: Trintellix  working feeling stronger, lighter, generally more optimistic.  In complete contrast to wife.  I'm glad I'm on Trintellix . Satisfied with Ritalin .  Missed 4 days and was very tired.  On it 20 years. Overall satisfied with meds. Remains under high stress with wife and at home. No SE. No med changes  01/20/2022 appointment with the following noted: No Still on Trintellix  10 and Ritalin  and making life better. No SE. Better mood has been maintained. No problems with sleep.  Using CPAP Satisfied with Ritalin .  Missed 4 days and was very tired.  On it 20 years. Still sings in group.    09/02/22 appt noted: Ongoing marital stress but maybe some progress.  Overall thinks she's still depressed. This causes him to wrestle too but counseling creates some issues.  Joint sessions with Berwyn Ellen are helping.   Continues Trintellix  10 and Ritalin  10 QID. Compliant without a problem.   Continues singing. Normally 6-7 hours of sleep but even if 9 hours falls asleep reading. CPAP working properly.  Consistent with it. Mood about the same as Nov 2022.   No SE.   Plan: cont Trintellix  10 Start one half of 75 mg tablet Sunosi  in the morning for 3 days, Then increase Sunosi  to 175 mg tablet in the morning for 1 week, Then if needed increase Sunosi  to 1-1/2 of the 75 mg tablets in the morning for 1 week, Then if needed increase Sunosi  to the 150 mg tablet If it is helpful reduce Ritalin  to 1/2 tablet 4 times daily for 2 week s, Then stop Ritalin   11/23/22 appt noted: Sunosi  helpful and is more awake and it really works. More motivated to keep moving.  Gotten into habit or trying to go to bed reasonable hour unless gets stuck on computer on phone.  Can find endless distractions.  Situation is amplifying that. No SE. Still taking Ritalin . Tendency to be very withdrawn.   Chronic  tension with wife and tends to avoid her bc her behavior.   Stilll using CPAP.   Seems to be anxious.  Like my world is collapsing .  Busy beating himself up on what he's not doing and W  jumps on board to be critical.   She is out of control with criticising him over trivial matters.  Criticism is unending.  Plan: continue Trintellix  10  Conintue bc benefit  Sunosi  to the 150 mg tablet Rec trial reduce Ritalin  to 1/2 tablet 4 times daily for 2 week s, Then stop Ritalin .  He's getting from PCP Rolan Gander.  02/21/23 appt noted: Got off Ritalin . For over a month.  A little shakey at first but got over it.  Had to stop abruptly Dt shortage and out of town.   I feel better psychologically off it.  Thinks he was a prisoner to Ritalin  and Sunosi  freed him from that.   Wonders about weaning off Trintellix .  Trying to wean for sex activity if possible.   Some ED and tried them in the past. 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 Doing [employer] training and enjoying it a lot.  Teaching him a lot.  Disc risks weaning Trintellix  and will start after tax season. Anxiety is better than it was. Plan: Conintue bc benefit Sunosi  to the 150 mg tablet Successful off Ritalin  Okay to try to wean off Trintellix  after tax season per his request.  Discussed the risk of relapse.  04/20/23 appt noted: Right now mood is good and feels satisfied with things except marriage.  Says even others , friends notice wife's anger problems.   When tried higher dose Trintellix  didn't see benefit and had vague SE.  Seemed like it was too high.  But didn't stop it.   Current psych meds: Trintellix  10, sunosi  150 AM No current SE.   Overall feels more stable on Sunosi  vs Ritalin .  Easier to take with less worrying about how it affects him.  Generally prefers to be on less med.   Has had low periods of depression .   Enjoys barbershop singing.  Active.   Plan no changes.  05/18/23 appt noted: Still doing  well with meds.   Rare good night sleep.  Always feels he hasn't accomplished enough and has tendency to stay up too late at night doing tasks. Plan: no med changes  08/19/23 appt noted: Generally pretty good.  Not markedly dep.    Meds as above.  Feels they are both helping. Looked up SE Wonders if Sunosi  causing anxiety.  It is helping some but not resolved things.  Doesn't want to return to [facility] bc SE Has central and Obstructive sleep apnea.  Uses CPAP Can still fall asleep in day even with good night's sleep.  Will have apneic events napping in the chair.   Volunteering more and very busy.  Started [employer] and active.   Going well.   Home life no better.  Wife critical and negative. Wife says he's too easily distracted.  Like if doing task and goes by TV then gets distracted by the TV. Plan: Continue bc benefit Sunosi  to the 150 mg tablet BUT reduce to 112.5 mg to see if anxiety is better See if anxiety is better or not and then reevaluate wife's concerns about is inattention.   10/06/23 appt noted: Psych med; Trintellix  10, Sunosi  reduced to about 100 mg  SE minimal. Thinks he is doing well overall.  More alert driving with the car with it.  Switched directly from MPH to Sunosi  wihtout change in alertness.  Not as energetic and hyper with Sunosi .  Les anxiety with lower dose. Mood is pretty good.  Generally doing better.  More engaged and active.   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 To [place] Jan for competition.   He wants the least amount med and asks about reducing doses.  Plan: Reduce Trintellix  5 mg daily and if well at FU will stop it. .  This is bc of the benefit of Sunosi  might be sufficient for mood.    02/01/24 appt noted: Med: Trintellix  10, Sunosi  150 Went 3-4 weeks into Trintellix  5 and more dep and increased back to 10 mg dialy. Feels pretty good now.  Increased Sunosi  150. Busy and not always as much sleep.  When good night  feels great.  Avg 5-8 hour.   Chronically feels behind and stays up too late doing things. Not yawning all day with Sunosi .  Less napping and more alert with Sunosi .  Used to be so drowsy he'd fall asleep at stop lights. Sunosi  is easier to take than Ritalin .  05/07/24 appt noted: Med: stopped Trintellix  10, Sunosi  150 Stopped Trintellix  a couple of weeks ago.  Feels dull and foggy about everything. No change in mood so far.  Feels a little more alert and responsive off Trintellix .   Not very good a getting a lot of sleep.  Feels like he is behind and stays up too late.  Avg 6+ hours but needs more.   Concern over cost Sunosi  and asks abotu Concerta .   Cecilia still bouncing off the walls.  Hyper and critical.  She blames him for everything.  She  co severe back pain.   Drinking daily but secretive.    07/12/24 appt noted:  Med :  Trintellix  stopped, Concerta  36 AM All in all doing quite well. Had hearing issue and had MRI head.   Was worried over it.  Now is OK.  Dep is ok now but worse with stressors. Less foggy mentally and easier to get up in the AM off the Trintellix .  No sig dep. Taking Concerta  36 working fine.  With benefit and no sig SE No changes desired.   Past Psychiatric Medication Trials:  History Elodie Anon.  On and off antidepressants for 20 years  Doesn't remember any being helpful.  lexapro  10  Trintellix  10, 15 mg not more helpful and vague SE Remote Wellbutrin  450.   Doesn't remember the names of prior meds but remembers sexual SE with one. History of Nuvigil for hypersomnolence but he does not recall the response but apparently not good enough  so Ritalin  was RX in it's place Sunosi  150 benefit cost issues  Review of Systems:  Review of Systems  Constitutional:  Positive for fatigue.  Eyes:  Positive for visual disturbance.  Cardiovascular:  Negative for chest pain and palpitations.  Gastrointestinal:  Negative for nausea.  Musculoskeletal:  Positive for  arthralgias.  Neurological:  Negative for tremors.  Psychiatric/Behavioral:  Positive for decreased concentration. Negative for dysphoric mood. The patient is not nervous/anxious.     Medications: I have reviewed the patient's current medications.  Current Outpatient Medications  Medication Sig Dispense Refill   NOVOLOG 100 UNIT/ML injection U IN INSULIN  PUMP .APPROXIMATELY 80 UNITS A DAY  6   PROLENSA 0.07 % SOLN INT 1 GTT IN OS HS  6   ramipril (ALTACE) 5 MG capsule Take 5 mg by mouth 2 (two) times daily.     tadalafil  (CIALIS ) 20 MG tablet TAKE 1 TABLET(20 MG) BY MOUTH DAILY AS NEEDED FOR ERECTILE DYSFUNCTION 15 tablet 1   ciclopirox  (PENLAC ) 8 % solution Apply topically at bedtime. Apply over nail and surrounding skin. Apply daily over previous coat. After seven (7) days, may remove with alcohol  and continue cycle. (Patient not taking: Reported on 07/04/2024) 6.6  mL 2   Cinnamon 500 MG capsule Take 500 mg by mouth daily. (Patient not taking: Reported on 07/04/2024)     CONTOUR NEXT TEST test strip      methylphenidate  (CONCERTA ) 36 MG PO CR tablet Take 1 tablet (36 mg total) by mouth daily. 90 tablet 0   Multiple Vitamins-Minerals (PRESERVISION AREDS 2) CAPS See admin instructions.     TRINTELLIX  10 MG TABS tablet TAKE 1 TABLET DAILY (Patient not taking: Reported on 07/12/2024) 90 tablet 1   No current facility-administered medications for this visit.    Medication Side Effects: None  Allergies:  Allergies  Allergen Reactions   Pravastatin Other (See Comments)    Past Medical History:  Diagnosis Date   Anxiety    Arthritis    Cancer (HCC)    skin CA removed   Depression    Diabetes mellitus    Diabetes mellitus without complication (HCC)    Type 1   Elevated PSA 11/2014   PCP ordered for patient to take Cipro for 21 days   Erectile dysfunction    Hypercholesteremia    Skin cancer of face    Sleep apnea    wears CPAP nightly    Family History  Problem Relation Age of  Onset   Cancer Father        Esophageal/gastric cancer   Cancer Mother        Metastatic cancer   Rheum arthritis Mother    Colon cancer Neg Hx    Pancreatic cancer Neg Hx    Stomach cancer Neg Hx     Social History   Socioeconomic History   Marital status: Married    Spouse name: Not on file   Number of children: 2   Years of education: Not on file   Highest education level: Not on file  Occupational History   Occupation: Retired    Associate Professor: [employer]  Tobacco Use   Smoking status: Former    Current packs/day: 0.00    Average packs/day: 2.0 packs/day for 15.0 years (30.0 ttl pk-yrs)    Types: Cigarettes    Start date: 1966    Quit date: 35    Years since quitting: 44.6   Smokeless tobacco: Never  Substance and Sexual Activity   Alcohol  use: No    Comment: 1986   Drug use: No   Sexual activity: Not on file  Other Topics Concern   Not on file  Social History Narrative    Merged History Encounter        Social Drivers of Health   Financial Resource Strain: Not on file  Food Insecurity: Not on file  Transportation Needs: Not on file  Physical Activity: Not on file  Stress: Not on file  Social Connections: Not on file  Intimate Partner Violence: Not on file    Past Medical History, Surgical history, Social history, and Family history were reviewed and updated as appropriate.   Please see review of systems for further details on the patient's review from today.   Objective:   Physical Exam:  There were no vitals taken for this visit.  Physical Exam Constitutional:      General: He is not in acute distress. Musculoskeletal:        General: No deformity.  Neurological:     Mental Status: He is alert and oriented to person, place, and time.     Cranial Nerves: No dysarthria.     Coordination: Coordination normal.  Gait: Gait normal.  Psychiatric:        Attention and Perception: Attention and perception normal. He does not perceive auditory  or visual hallucinations.        Mood and Affect: Mood is anxious. Mood is not depressed. Affect is not labile or blunt.        Speech: Speech normal.        Behavior: Behavior normal. Behavior is not slowed. Behavior is cooperative.        Thought Content: Thought content normal. Thought content is not paranoid or delusional. Thought content does not include homicidal or suicidal ideation. Thought content does not include suicidal plan.        Cognition and Memory: Cognition and memory normal.        Judgment: Judgment normal.     Comments: Insight intact Sx much better and Stephen's ministry work helps. But residual anxiety Not much dep after inreasing Trintellix      Lab Review:     Component Value Date/Time   NA 139 12/02/2014 0842   K 4.3 12/02/2014 0842   CL 106 12/02/2014 0842   CO2 25 12/02/2014 0842   GLUCOSE 223 (H) 12/02/2014 0842   BUN 24 (H) 12/02/2014 0842   CREATININE 0.87 12/02/2014 0842   CALCIUM  8.8 12/02/2014 0842   GFRNONAA 88 (L) 12/02/2014 0842   GFRAA >90 12/02/2014 0842       Component Value Date/Time   WBC 4.9 12/02/2014 0843   RBC 4.90 12/02/2014 0843   HGB 14.8 12/02/2014 0843   HCT 44.1 12/02/2014 0843   PLT 226 12/02/2014 0843   MCV 90.0 12/02/2014 0843   MCH 30.2 12/02/2014 0843   MCHC 33.6 12/02/2014 0843   RDW 13.1 12/02/2014 0843    No results found for: POCLITH, LITHIUM   No results found for: PHENYTOIN, PHENOBARB, VALPROATE, CBMZ   .res Assessment: Plan:    Revin Corker was seen today for follow-up, depression and anxiety.  Diagnoses and all orders for this visit:  Major depressive disorder with single episode, in full remission (HCC)  Sleep apnea with hypersomnolence -     methylphenidate  (CONCERTA ) 36 MG PO CR tablet; Take 1 tablet (36 mg total) by mouth daily.    30 min  We discussed When first seen he denied significant depressive symptoms although his wife thought he was depressed.    He realizes he  also needs therapy for this tendency to self sabotage and also for marital conflict that has resulted.  Ongoing conflict with his wife. Overall feels stronger and more confident. Sx much better and [employer] training helps.  Disc sleep hygiene and need to get enough sleep.    Cog better off Trintellix  and mood stable.  Hypersomnolence predates antidepressants and failed to respond to armodafinil but is improved though not resolved with Ritalin  10 mg 4 times daily but even better with Sunosi  than Ritalin .  Doing well with Concerta . Concerta  36 AM DT cost Call if need to increase to 54  Cialis  10-20 mg prn.   Disc se and dosing range.  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 Still enjoys that.  Has been in counseling.     FU 3-4 months  Lorene Macintosh, MD, DFAPA   Future Appointments  Date Time Provider Department Center  07/31/2024  9:00 AM Cottle, Lorene KANDICE Raddle., MD [facility] None  08/22/2024  7:35 AM Alvia Norleen BIRCH, MD [facility] None  10/11/2024  3:45 PM McCue, Harlene, NP [facility] None    No orders of the defined types were placed in this encounter.    -------------------------------

## 2024-07-24 DIAGNOSIS — G720 Drug-induced myopathy: Secondary | ICD-10-CM | POA: Diagnosis not present

## 2024-07-24 DIAGNOSIS — Z4681 Encounter for fitting and adjustment of insulin pump: Secondary | ICD-10-CM | POA: Diagnosis not present

## 2024-07-24 DIAGNOSIS — Z794 Long term (current) use of insulin: Secondary | ICD-10-CM | POA: Diagnosis not present

## 2024-07-24 DIAGNOSIS — E109 Type 1 diabetes mellitus without complications: Secondary | ICD-10-CM | POA: Diagnosis not present

## 2024-07-24 DIAGNOSIS — I10 Essential (primary) hypertension: Secondary | ICD-10-CM | POA: Diagnosis not present

## 2024-07-24 DIAGNOSIS — E785 Hyperlipidemia, unspecified: Secondary | ICD-10-CM | POA: Diagnosis not present

## 2024-07-31 ENCOUNTER — Ambulatory Visit: Payer: Medicare Other | Admitting: Psychiatry

## 2024-08-20 DIAGNOSIS — R972 Elevated prostate specific antigen [PSA]: Secondary | ICD-10-CM | POA: Diagnosis not present

## 2024-08-22 ENCOUNTER — Encounter (INDEPENDENT_AMBULATORY_CARE_PROVIDER_SITE_OTHER): Admitting: Ophthalmology

## 2024-08-22 DIAGNOSIS — H35033 Hypertensive retinopathy, bilateral: Secondary | ICD-10-CM

## 2024-08-22 DIAGNOSIS — E113592 Type 2 diabetes mellitus with proliferative diabetic retinopathy without macular edema, left eye: Secondary | ICD-10-CM

## 2024-08-22 DIAGNOSIS — E113511 Type 2 diabetes mellitus with proliferative diabetic retinopathy with macular edema, right eye: Secondary | ICD-10-CM

## 2024-08-22 DIAGNOSIS — H43813 Vitreous degeneration, bilateral: Secondary | ICD-10-CM

## 2024-08-22 DIAGNOSIS — Z794 Long term (current) use of insulin: Secondary | ICD-10-CM | POA: Diagnosis not present

## 2024-08-22 DIAGNOSIS — H34811 Central retinal vein occlusion, right eye, with macular edema: Secondary | ICD-10-CM | POA: Diagnosis not present

## 2024-08-22 DIAGNOSIS — I10 Essential (primary) hypertension: Secondary | ICD-10-CM

## 2024-09-19 DIAGNOSIS — R972 Elevated prostate specific antigen [PSA]: Secondary | ICD-10-CM | POA: Diagnosis not present

## 2024-09-21 ENCOUNTER — Other Ambulatory Visit: Payer: Self-pay | Admitting: Urology

## 2024-09-21 DIAGNOSIS — R972 Elevated prostate specific antigen [PSA]: Secondary | ICD-10-CM

## 2024-10-08 ENCOUNTER — Ambulatory Visit
Admission: RE | Admit: 2024-10-08 | Discharge: 2024-10-08 | Disposition: A | Discharge status: Home / Self Care | Source: Ambulatory Visit | Attending: Urology | Admitting: Urology

## 2024-10-08 DIAGNOSIS — R972 Elevated prostate specific antigen [PSA]: Secondary | ICD-10-CM

## 2024-10-08 MED ORDER — GADOPICLENOL 0.5 MMOL/ML IV SOLN
7.5000 mL | Freq: Once | INTRAVENOUS | Status: AC | PRN
  Administered 2024-10-08: 7 mL via INTRAVENOUS

## 2024-10-11 ENCOUNTER — Telehealth: Payer: Federal, State, Local not specified - PPO | Admitting: Adult Health

## 2024-10-12 ENCOUNTER — Other Ambulatory Visit: Payer: Self-pay

## 2024-10-12 ENCOUNTER — Telehealth: Payer: Self-pay | Admitting: Psychiatry

## 2024-10-12 DIAGNOSIS — G4710 Hypersomnia, unspecified: Secondary | ICD-10-CM

## 2024-10-12 MED ORDER — METHYLPHENIDATE HCL ER (OSM) 36 MG PO TBCR: 36.0000 mg | EXTENDED_RELEASE_TABLET | Freq: Every day | ORAL | 0 refills | Status: DC

## 2024-10-12 NOTE — Telephone Encounter (Signed)
 Pended

## 2024-10-12 NOTE — Telephone Encounter (Signed)
 Pt is requesting next 90 day RF for Concerta  to be sent to mail order. Appt  11/10. [facility] - [place], [place] - [facility] AT Portal to [employer]

## 2024-10-15 ENCOUNTER — Ambulatory Visit: Admitting: Psychiatry

## 2024-10-15 ENCOUNTER — Encounter: Payer: Self-pay | Admitting: Psychiatry

## 2024-10-15 DIAGNOSIS — G4733 Obstructive sleep apnea (adult) (pediatric): Secondary | ICD-10-CM

## 2024-10-15 DIAGNOSIS — G4710 Hypersomnia, unspecified: Secondary | ICD-10-CM | POA: Diagnosis not present

## 2024-10-15 DIAGNOSIS — F411 Generalized anxiety disorder: Secondary | ICD-10-CM

## 2024-10-15 DIAGNOSIS — G4730 Sleep apnea, unspecified: Secondary | ICD-10-CM

## 2024-10-15 MED ORDER — METHYLPHENIDATE HCL ER (OSM) 36 MG PO TBCR: 36.0000 mg | EXTENDED_RELEASE_TABLET | Freq: Every day | ORAL | 0 refills | Status: DC

## 2024-10-15 NOTE — Progress Notes (Signed)
 Jermaine Waters [id number] [birth year] 78 y.o.   Subjective:   Patient ID:  Jermaine Waters is a 78 y.o. (DOB [birth year]) male.  Chief Complaint:  Chief Complaint  Patient presents with   Follow-up   ADD    Jermaine Waters presents to the office today for follow-up of mood concerns and hypersomnolence.    Patient was seen August 08, 2019.  The assessment was depression.  The following was done: STOP  bupropion  75 mg. Start bupropion  XL 150 mg tablets 1 each morning for 2 weeks. If no side effects such as jitteriness, then increase to 2 tablets each morning which equals 300 mg daily. He continued Ritalin  10 QID.  Patient was seen October 10, 2019.  He had a partial response with Wellbutrin  XL 300 mg daily.  He was tolerating it and therefore the dosage was increased to 450 mg a day. He continued Ritalin  10 mg 4 times daily No Covid problems.   Didn't notice much difference with increase Wellbutrin .  Jermaine Waters thinks he's a little more lethargic.  In some ways a little happier but really struggling with Jermaine Waters.  Pending temporary separation for a few months to try to provide some healing.  Hopefully will help both of them.   Jermaine Waters concerned about Ritalin  Rx for alertness DT OSA and central apnea that even affects him while awake.  PT meds could fall asleep driving.  Ritalin  helps and CPAP helps.  Helps alerness and no insomnia.  Wonders about SE of it. A little more optimistic and less tense.  Therapy helpng also. No SE.  Not jitttery nor edgy.  Jermaine Waters critical that he's less responsive, but maybe I'm less on edge. Energy is fine.  Interests hard to answer bc not a lot of outside activities left DT Covid.  Couple of small groups meeting with Zoom.  Most of free time before was singing but can't do it now. Things changed and wants meds and therapy.  PCP started Wellbutrin  a couple of weeks ago. All my life self-sabotage with jobs and has to restart.  Fear of failure.  Some worsening health issues  dropping productivity.  Jermaine Waters frustrated with him and constantly advising and critical.  Marital stress and unhappiness worse bc together all the time. Plan because of residual depression consideration was given to augment with Abilify.  He was in the process of moving and he felt that perhaps after the move he might have a different view and he wanted to defer.  So no meds were changed.  Appointment Apr 07, 2020, the following was noted: He did get moved.  Still having conflict with his Jermaine Waters which is a chronic stress. Pt reports that mood is Anxious, Depressed and Dysphoric and describes depression and anxiety as mild or mild to moderate but overall improved since last visit.. Anxiety symptoms include: Excessive Worry, avoidant of stress or pressure or commitments.. Pt reports no sleep issues and 7 hours but prefers 8 hours. Pt reports that appetite is good. Pt reports that energy is poor and anhedonia, poor motivation and withdrawn from usual activities. Concentration is down slightly. Suicidal thoughts:  denied by patient. Plan:  No med changes  05/21/2020 appointment the following is noted: Urgent appt at his request. One concern is more anxiety and procrastinates.Jermaine Waters if he's working on the wrong problem.  Bearl of everything.  ? Fear of failure.  Easily stressed by small demands.  Stress averse and not operating at full capacity. No history of anxiety meds.  Counseling. Ritalin  10 QID for hypersomnolence but questions ADD bc is distractible.  Sleep apnea for 20 years on CPAP.  Chronically sleepy. Plan: no med changes:  08/26/2020 appointment with the following noted: Increased Wellbutrin  to 450 mg has worked fine. Feeling great.  Only negative is sleepy if sits down to read. Sleepy all his life and long term CPAP user.  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 No trouble with AK since July 20 and no anxiety while she's gone.   Plan: Doing well.  No med changes  12/31/2020  appointment with following noted: Jermaine Waters joined per patient request Overall OK but somewhat lifeless and disconnected and wonders if med related or not and if changes can be made. Otherwise not anxious.  Not joyful.   Jermaine Waters Jermaine Waters has different perspective in some areas.  She thinks he's too inactive.  She says since retirement he lacks drive to do much.  Rare exercise. Plan: Reduce the Lexapro  to 5 mg nightly to see if it is having a dulling effect   02/25/2021 appointment with the following noted: No difference noted with less Lexapro .  Overall sort of cloudy and in my own world.  Jermaine Waters CO his memory issues.  Depressed, frustrated, overwhelmed and probably worse after the reduction in meds. Caring for Jermaine Waters with TKR.  Stressful relationship very. Does go out 3-4 nights per week with various groups and some church activity.  Plan: DC Lexapro  and start duloxetine  30 mg for a week then 60 mg daily. Continue Wellbutrin  XL 450 mg daily Continue Ritalin  10 mg 4 times daily  06/01/2021 appointment with the following noted: A little more irritable.  Esp with his Jermaine Waters.  Jermaine Waters complains about him.  He's afraid of saying something that upsets her. Chronic sleepiness is worse he thinks.  Thinks it's med related. Depression is unchanged.   Plan: Wean duloxetine  using 20 mg capsules to 40 mg daily for 2 weeks then 20 mg daily for 2 weeks then stop it in order to prevent withdrawal symptoms.  He will pay attention to see if irritability and alertness are improved. Continue Wellbutrin  XL 450 mg daily Continue Ritalin  10 mg tablets 4 times daily  08/20/2021 appointment with the following noted: At times doesn't think any meds are doing anything and doesn't remember meds helping him.  Wonders if he can get off the Wellbutrin  Neuropsych testing is not suggesting dementia. Still chronic marital problems no better and hostile relationship.  Talking about separation.  Working to sell the house. Not highly motivated.   Knows he'd feel better separated from Jermaine Waters.  Can find things he enjoys at home.  Thinks he's underachieved in life.  Occ feels hopeless.  Lists of things he needs to do with constant sense of procrastination on things necessary to do. Plan: Wean Wellbutrin  and start Trintellix  5 mg daily for 4 to 7 days and if there is no nausea then increase to 10 mg daily.  09/16/21 appt noted: Just increased Trintellix  to 10 mg daily 2 days ago.  No SE noted. Off Wellbutrin . Signed contract to sell house by end of month and Jermaine Waters gone off deep-end and not functioning and is at home in bed.  She's having more emotional outbursts and definitely hyped.  He thinks she's hyper and talking constantly and more aggressive, slamming doors etc than usual. Overall he feels stronger and happier with change in meds.  He'd like to contrinue it.   Plan continue Trintellix  10 mg daily and Ritalin  10  mg 4 times daily  10/20/2021 appointment with the following noted: Trintellix  working feeling stronger, lighter, generally more optimistic.  In complete contrast to Jermaine Waters.  I'm glad I'm on Trintellix . Satisfied with Ritalin .  Missed 4 days and was very tired.  On it 20 years. Overall satisfied with meds. Remains under high stress with Jermaine Waters and at home. No SE. No med changes  01/20/2022 appointment with the following noted: No Still on Trintellix  10 and Ritalin  and making life better. No SE. Better mood has been maintained. No problems with sleep.  Using CPAP Satisfied with Ritalin .  Missed 4 days and was very tired.  On it 20 years. Still sings in group.    09/02/22 appt noted: Ongoing marital stress but maybe some progress.  Overall thinks she's still depressed. This causes him to wrestle too but counseling creates some issues.  Joint sessions with Jermaine Waters are helping.   Continues Trintellix  10 and Ritalin  10 QID. Compliant without a problem.   Continues singing. Normally 6-7 hours of sleep but even if 9 hours  falls asleep reading. CPAP working properly.  Consistent with it. Mood about the same as Nov 2022.   No SE.   Plan: cont Trintellix  10 Start one half of 75 mg tablet Sunosi  in the morning for 3 days, Then increase Sunosi  to 175 mg tablet in the morning for 1 week, Then if needed increase Sunosi  to 1-1/2 of the 75 mg tablets in the morning for 1 week, Then if needed increase Sunosi  to the 150 mg tablet If it is helpful reduce Ritalin  to 1/2 tablet 4 times daily for 2 week s, Then stop Ritalin   11/23/22 appt noted: Sunosi  helpful and is more awake and it really works. More motivated to keep moving.  Gotten into habit or trying to go to bed reasonable hour unless gets stuck on computer on phone.  Can find endless distractions.  Situation is amplifying that. No SE. Still taking Ritalin . Tendency to be very withdrawn.   Chronic tension with Jermaine Waters and tends to avoid her bc her behavior.   Stilll using CPAP.   Seems to be anxious.  Like my world is collapsing .  Busy beating himself up on what he's not doing and W  jumps on board to be critical.   She is out of control with criticising him over trivial matters.  Criticism is unending.  Plan: continue Trintellix  10  Conintue bc benefit Sunosi  to the 150 mg tablet Rec trial reduce Ritalin  to 1/2 tablet 4 times daily for 2 week s, Then stop Ritalin .  He's getting from PCP Rolan Gander.  02/21/23 appt noted: Got off Ritalin . For over a month.  A little shakey at first but got over it.  Had to stop abruptly Dt shortage and out of town.   I feel better psychologically off it.  Thinks he was a prisoner to Ritalin  and Sunosi  freed him from that.   Wonders about weaning off Trintellix .  Trying to wean for sex activity if possible.   Some ED and tried them in the past. 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  Teaching him a lot.  Disc risks weaning Trintellix  and will start after tax  season. Anxiety is better than it was. Plan: Conintue bc benefit Sunosi  to the 150 mg tablet Successful off Ritalin  Okay to try to wean off Trintellix  after tax season per his request.  Discussed the risk  of relapse.  04/20/23 appt noted: Right now mood is good and feels satisfied with things except marriage.  Says even others , friends notice Jermaine Waters's anger problems.   When tried higher dose Trintellix  didn't see benefit and had vague SE.  Seemed like it was too high.  But didn't stop it.   Current psych meds: Trintellix  10, sunosi  150 AM No current SE.   Overall feels more stable on Sunosi  vs Ritalin .  Easier to take with less worrying about how it affects him.  Generally prefers to be on less med.   Has had low periods of depression .   Enjoys barbershop singing.  Active.   Plan no changes.  05/18/23 appt noted: Still doing well with meds.   Rare good night sleep.  Always feels he hasn't accomplished enough and has tendency to stay up too late at night doing tasks. Plan: no med changes  08/19/23 appt noted: Generally pretty good.  Not markedly dep.    Meds as above.  Feels they are both helping. Looked up SE Wonders if Sunosi  causing anxiety.  It is helping some but not resolved things.  Doesn't want to return to [facility] bc SE Has central and Obstructive sleep apnea.  Uses CPAP Can still fall asleep in day even with good night's sleep.  Will have apneic events napping in the chair.   Volunteering more and very busy.  Started [employer] and active.   Going well.   Home life no better.  Jermaine Waters critical and negative. Jermaine Waters says he's too easily distracted.  Like if doing task and goes by TV then gets distracted by the TV. Plan: Continue bc benefit Sunosi  to the 150 mg tablet BUT reduce to 112.5 mg to see if anxiety is better See if anxiety is better or not and then reevaluate Jermaine Waters's concerns about is inattention.   10/06/23 appt noted: Psych med; Trintellix  10, Sunosi   reduced to about 100 mg  SE minimal. Thinks he is doing well overall.  More alert driving with the car with it.  Switched directly from MPH to Sunosi  wihtout change in alertness.  Not as energetic and hyper with Sunosi .  Les anxiety with lower dose. Mood is pretty good.  Generally doing better.  More engaged and active.   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 To [place] Jan for competition.   He wants the least amount med and asks about reducing doses.   Plan: Reduce Trintellix  5 mg daily and if well at FU will stop it. .  This is bc of the benefit of Sunosi  might be sufficient for mood.    02/01/24 appt noted: Med: Trintellix  10, Sunosi  150 Went 3-4 weeks into Trintellix  5 and more dep and increased back to 10 mg dialy. Feels pretty good now.  Increased Sunosi  150. Busy and not always as much sleep.  When good night feels great.  Avg 5-8 hour.   Chronically feels behind and stays up too late doing things. Not yawning all day with Sunosi .  Less napping and more alert with Sunosi .  Used to be so drowsy he'd fall asleep at stop lights. Sunosi  is easier to take than Ritalin .  05/07/24 appt noted: Med: stopped Trintellix  10, Sunosi  150 Stopped Trintellix  a couple of weeks ago.  Feels dull and foggy about everything. No change in mood so far.  Feels a little more alert and responsive off Trintellix .   Not very good a getting a lot of sleep.  Feels like he is behind and stays up too late.  Avg 6+ hours but needs more.   Concern over cost Sunosi  and asks abotu Concerta .   Cecilia still bouncing off the walls.  Hyper and critical.  She blames him for everything.  She  co severe back pain.   Drinking daily but secretive.    07/12/24 appt noted:  Med :  Trintellix  stopped, Concerta  36 AM All in all doing quite well. Had hearing issue and had MRI head.   Was worried over it.  Now is OK.  Dep is ok now but worse with stressors. Less foggy mentally and easier to get up in the AM off the  Trintellix .  No sig dep. Taking Concerta  36 working fine.  With benefit and no sig SE No changes desired.  10/15/24 appt noted:  Med :   Concerta  36 AM Generally well and pretty balanced.   When forgets Concerta  wanted to sleep.  Disc it .  Works well with me and no SE .  Not yawning all day long. Not dep. High PSA 39 and pending bx.  Past Psychiatric Medication Trials:  History Jermaine Waters.  On and off antidepressants for 20 years  Doesn't remember any being helpful.  lexapro  10  Trintellix  10, 15 mg not more helpful and vague SE Remote Wellbutrin  450.   Doesn't remember the names of prior meds but remembers sexual SE with one. History of Nuvigil for hypersomnolence but he does not recall the response but apparently not good enough  so Ritalin  was RX in it's place Sunosi  150 benefit cost issues  Review of Systems:  Review of Systems  Constitutional:  Positive for fatigue.  Eyes:  Positive for visual disturbance.  Cardiovascular:  Negative for chest pain and palpitations.  Gastrointestinal:  Negative for nausea.  Musculoskeletal:  Positive for arthralgias.  Neurological:  Negative for tremors.  Psychiatric/Behavioral:  Positive for decreased concentration. Negative for dysphoric mood. The patient is not nervous/anxious.     Medications: I have reviewed the patient's current medications.  Current Outpatient Medications  Medication Sig Dispense Refill   ciclopirox  (PENLAC ) 8 % solution Apply topically at bedtime. Apply over nail and surrounding skin. Apply daily over previous coat. After seven (7) days, may remove with alcohol  and continue cycle. 6.6 mL 2   Cinnamon 500 MG capsule Take 500 mg by mouth daily.     CONTOUR NEXT TEST test strip      Multiple Vitamins-Minerals (PRESERVISION AREDS 2) CAPS See admin instructions.     NOVOLOG 100 UNIT/ML injection U IN INSULIN  PUMP .APPROXIMATELY 80 UNITS A DAY  6   PROLENSA 0.07 % SOLN INT 1 GTT IN OS HS  6   ramipril (ALTACE) 5  MG capsule Take 5 mg by mouth 2 (two) times daily.     tadalafil  (CIALIS ) 20 MG tablet TAKE 1 TABLET(20 MG) BY MOUTH DAILY AS NEEDED FOR ERECTILE DYSFUNCTION 15 tablet 1   methylphenidate  (CONCERTA ) 36 MG PO CR tablet Take 1 tablet (36 mg total) by mouth daily. 90 tablet 0   No current facility-administered medications for this visit.    Medication Side Effects: None  Allergies:  Allergies  Allergen Reactions   Pravastatin Other (See Comments)    Past Medical History:  Diagnosis Date   Anxiety    Arthritis    Cancer (HCC)    skin CA removed   Depression    Diabetes mellitus    Diabetes mellitus without complication (HCC)  Type 1   Elevated PSA 11/2014   PCP ordered for patient to take Cipro for 21 days   Erectile dysfunction    Hypercholesteremia    Skin cancer of face    Sleep apnea    wears CPAP nightly    Family History  Problem Relation Age of Onset   Cancer Father        Esophageal/gastric cancer   Cancer Mother        Metastatic cancer   Rheum arthritis Mother    Colon cancer Neg Hx    Pancreatic cancer Neg Hx    Stomach cancer Neg Hx     Social History   Socioeconomic History   Marital status: Married    Spouse name: Not on file   Number of children: 2   Years of education: Not on file   Highest education level: Not on file  Occupational History   Occupation: Retired    Associate Professor: [employer]  Tobacco Use   Smoking status: Former    Current packs/day: 0.00    Average packs/day: 2.0 packs/day for 15.0 years (30.0 ttl pk-yrs)    Types: Cigarettes    Start date: 1966    Quit date: 36    Years since quitting: 44.8   Smokeless tobacco: Never  Substance and Sexual Activity   Alcohol  use: No    Comment: 1986   Drug use: No   Sexual activity: Not on file  Other Topics Concern   Not on file  Social History Narrative    Merged History Encounter        Social Drivers of Health   Financial Resource Strain: Not on file  Food Insecurity:  Not on file  Transportation Needs: Not on file  Physical Activity: Not on file  Stress: Not on file  Social Connections: Not on file  Intimate Partner Violence: Not on file    Past Medical History, Surgical history, Social history, and Family history were reviewed and updated as appropriate.   Please see review of systems for further details on the patient's review from today.   Objective:   Physical Exam:  There were no vitals taken for this visit.  Physical Exam Constitutional:      General: He is not in acute distress. Musculoskeletal:        General: No deformity.  Neurological:     Mental Status: He is alert and oriented to person, place, and time.     Cranial Nerves: No dysarthria.     Coordination: Coordination normal.     Gait: Gait normal.  Psychiatric:        Attention and Perception: Attention and perception normal. He does not perceive auditory or visual hallucinations.        Mood and Affect: Mood is anxious. Mood is not depressed. Affect is not labile or blunt.        Speech: Speech normal.        Behavior: Behavior normal. Behavior is not slowed. Behavior is cooperative.        Thought Content: Thought content normal. Thought content is not paranoid or delusional. Thought content does not include homicidal or suicidal ideation. Thought content does not include suicidal plan.        Cognition and Memory: Cognition and memory normal.        Judgment: Judgment normal.     Comments: Insight intact Sx much better and Jermaine Waters's ministry work helps. But residual anxiety      Lab  Review:     Component Value Date/Time   NA 139 12/02/2014 0842   K 4.3 12/02/2014 0842   CL 106 12/02/2014 0842   CO2 25 12/02/2014 0842   GLUCOSE 223 (H) 12/02/2014 0842   BUN 24 (H) 12/02/2014 0842   CREATININE 0.87 12/02/2014 0842   CALCIUM  8.8 12/02/2014 0842   GFRNONAA 88 (L) 12/02/2014 0842   GFRAA >90 12/02/2014 0842       Component Value Date/Time   WBC 4.9 12/02/2014  0843   RBC 4.90 12/02/2014 0843   HGB 14.8 12/02/2014 0843   HCT 44.1 12/02/2014 0843   PLT 226 12/02/2014 0843   MCV 90.0 12/02/2014 0843   MCH 30.2 12/02/2014 0843   MCHC 33.6 12/02/2014 0843   RDW 13.1 12/02/2014 0843    No results found for: POCLITH, LITHIUM   No results found for: PHENYTOIN, PHENOBARB, VALPROATE, CBMZ   .res Assessment: Plan:    Octavis Sheeler was seen today for follow-up and add.  Diagnoses and all orders for this visit:  Sleep apnea with hypersomnolence -     methylphenidate  (CONCERTA ) 36 MG PO CR tablet; Take 1 tablet (36 mg total) by mouth daily.  Generalized anxiety disorder  Obstructive sleep apnea     30 min  We discussed When first seen he denied significant depressive symptoms although his Jermaine Waters thought he was depressed.    He realizes he also needs therapy for this tendency to self sabotage and also for marital conflict that has resulted.  Ongoing conflict with his Jermaine Waters. Overall feels stronger and more confident. Sx much better and General dynamics training helps.  Disc sleep hygiene and need to get enough sleep.    Cog better off Trintellix  and mood stable.  Hypersomnolence predates antidepressants and failed to respond to armodafinil but is improved though not resolved with Ritalin  10 mg 4 times daily but even better with Sunosi  than Ritalin .  Doing well with Concerta . Concerta  36 AM DT cost Call if need to increase to 54  Cialis  10-20 mg prn.   Disc se and dosing range.  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 Still enjoys that.  Has been in counseling.     FU 6 months  Jermaine Macintosh, MD, DFAPA   Future Appointments  Date Time Provider Department Center  10/24/2024  7:35 AM Jermaine Norleen BIRCH, MD [facility] None  11/06/2024  3:15 PM Jermaine Raisin, NP [facility] None    No orders of the defined types were placed in this  encounter.    -------------------------------

## 2024-10-24 ENCOUNTER — Encounter (INDEPENDENT_AMBULATORY_CARE_PROVIDER_SITE_OTHER): Admitting: Ophthalmology

## 2024-10-24 DIAGNOSIS — E113591 Type 2 diabetes mellitus with proliferative diabetic retinopathy without macular edema, right eye: Secondary | ICD-10-CM

## 2024-10-24 DIAGNOSIS — Z794 Long term (current) use of insulin: Secondary | ICD-10-CM

## 2024-10-24 DIAGNOSIS — H34812 Central retinal vein occlusion, left eye, with macular edema: Secondary | ICD-10-CM

## 2024-10-24 DIAGNOSIS — H35033 Hypertensive retinopathy, bilateral: Secondary | ICD-10-CM | POA: Diagnosis not present

## 2024-10-24 DIAGNOSIS — I10 Essential (primary) hypertension: Secondary | ICD-10-CM | POA: Diagnosis not present

## 2024-10-24 DIAGNOSIS — E113512 Type 2 diabetes mellitus with proliferative diabetic retinopathy with macular edema, left eye: Secondary | ICD-10-CM

## 2024-10-24 DIAGNOSIS — H43813 Vitreous degeneration, bilateral: Secondary | ICD-10-CM | POA: Diagnosis not present

## 2024-10-25 DIAGNOSIS — R82998 Other abnormal findings in urine: Secondary | ICD-10-CM | POA: Diagnosis not present

## 2024-10-25 DIAGNOSIS — I10 Essential (primary) hypertension: Secondary | ICD-10-CM | POA: Diagnosis not present

## 2024-10-25 DIAGNOSIS — E109 Type 1 diabetes mellitus without complications: Secondary | ICD-10-CM | POA: Diagnosis not present

## 2024-10-25 DIAGNOSIS — Z1212 Encounter for screening for malignant neoplasm of rectum: Secondary | ICD-10-CM | POA: Diagnosis not present

## 2024-11-06 ENCOUNTER — Ambulatory Visit: Admitting: Adult Health

## 2024-11-06 ENCOUNTER — Encounter: Payer: Self-pay | Admitting: Adult Health

## 2024-11-06 VITALS — BP 142/62 | HR 87 | Ht 65.0 in | Wt 174.0 lb

## 2024-11-06 DIAGNOSIS — Z9989 Dependence on other enabling machines and devices: Secondary | ICD-10-CM | POA: Diagnosis not present

## 2024-11-06 DIAGNOSIS — G4739 Other sleep apnea: Secondary | ICD-10-CM

## 2024-11-06 NOTE — Progress Notes (Signed)
 Jermaine Waters [address] [place]. [place] [address] [phone]       OFFICE FOLLOW UP NOTE  [name] Date of Birth:  1946/05/31 Medical Record Number:  [id number]   Primary neurologist: Dr. Chalice Reason for visit: CPAP follow-up  Chief Complaint  Patient presents with   Obstructive Sleep Apnea    Rm 3 alone Pt is well and stable, reports no OSA/CPAP concerns.      SUBJECTIVE:   Follow-up visit:  Prior visit: 10/24/2023   Brief HPI:   Jermaine Waters is a 78 y.o. male with longstanding history of sleep apnea and transferred care to Dr. Chalice in 2019.  Prior sleep study 2012.  Compliance report at initial visit showed elevated residual AHI on AutoPap pressure setting of 8-15 with EPR 2 and adjusted to 7-17 with EPR 3.  Was due for new machine and repeated sleep study 10/2020 which confirmed presence of severe sleep apnea with AHI of 40/h and REM AHI 59/h.  Received new AutoPap 08/2021.  At prior visit, compliance report showed excellent compliance and optimal residual AHI.  No changes at that time.    Interval history:  CPAP compliance report shows excellent usage and optimal residual AHI.  Continues to tolerate CPAP well. Uses nasal pillow with chin strap. Has been having issues finding a chin strap where the color will not bleed on his pillow. He plans on follow up with DME to discuss this.  Routinely follows with DME company [employer] and up-to-date on supplies. ESS 11/24.  Continues to stay active singing in multiple singing choirs, scheduled for national championship in [place] in January which he is looking forward to.  No questions or concerns at this time.          ROS:   14 system review of systems performed and negative with exception of those listed in HPI  PMH:  Past Medical History:  Diagnosis Date   Anxiety    Arthritis    Cancer (HCC)    skin CA removed   Depression    Diabetes mellitus    Diabetes  mellitus without complication (HCC)    Type 1   Elevated PSA 11/2014   PCP ordered for patient to take Cipro for 21 days   Erectile dysfunction    Hypercholesteremia    Skin cancer of face    Sleep apnea    wears CPAP nightly    PSH:  Past Surgical History:  Procedure Laterality Date   APPENDECTOMY     CHOLECYSTECTOMY     COLONOSCOPY     EYE SURGERY Bilateral    laser   ROTATOR CUFF REPAIR Right    SEPTOPLASTY Bilateral 12/11/2014   Procedure: NASAL BILATERAL SEPTOPLASTY;  Surgeon: Alm Bouche, MD;  Location: [facility] OR;  Service: ENT;  Laterality: Bilateral;   SHOULDER ARTHROSCOPY WITH ROTATOR CUFF REPAIR AND SUBACROMIAL DECOMPRESSION Right 04/05/2013   Procedure: RIGHT SHOULDER ARTHROSCOPY SUBACROMIAL DECOMPRESSION DISTAL CLAVICLE RESECTION AND ROTATOR CUFF REPAIR ;  Surgeon: Franky CHRISTELLA Pointer, MD;  Location: [facility] OR;  Service: Orthopedics;  Laterality: Right;   TONSILLECTOMY     TURBINATE REDUCTION N/A 12/11/2014   Procedure: INFERIOR TURBINATE REDUCTION;  Surgeon: Alm Bouche, MD;  Location: [facility] OR;  Service: ENT;  Laterality: N/A;    Social History:  Social History   Socioeconomic History   Marital status: Married    Spouse name: Not on file   Number of children: 2   Years of education: Not on file  Highest education level: Not on file  Occupational History   Occupation: Retired    Associate Professor: [employer]  Tobacco Use   Smoking status: Former    Current packs/day: 0.00    Average packs/day: 2.0 packs/day for 15.0 years (30.0 ttl pk-yrs)    Types: Cigarettes    Start date: 47    Quit date: 52    Years since quitting: 44.9   Smokeless tobacco: Never  Substance and Sexual Activity   Alcohol  use: No    Comment: 1986   Drug use: No   Sexual activity: Not on file  Other Topics Concern   Not on file  Social History Narrative    Merged History Encounter        Social Drivers of Health   Financial Resource Strain: Not on file  Food Insecurity: Not on  file  Transportation Needs: Not on file  Physical Activity: Not on file  Stress: Not on file  Social Connections: Not on file  Intimate Partner Violence: Not on file    Family History:  Family History  Problem Relation Age of Onset   Cancer Father        Esophageal/gastric cancer   Cancer Mother        Metastatic cancer   Rheum arthritis Mother    Colon cancer Neg Hx    Pancreatic cancer Neg Hx    Stomach cancer Neg Hx     Medications:   Current Outpatient Medications on File Prior to Visit  Medication Sig Dispense Refill   CONTOUR NEXT TEST test strip      methylphenidate  (CONCERTA ) 36 MG PO CR tablet Take 1 tablet (36 mg total) by mouth daily. 90 tablet 0   Multiple Vitamins-Minerals (PRESERVISION AREDS 2) CAPS See admin instructions.     NOVOLOG 100 UNIT/ML injection U IN INSULIN  PUMP .APPROXIMATELY 80 UNITS A DAY  6   PROLENSA 0.07 % SOLN INT 1 GTT IN OS HS  6   ramipril (ALTACE) 5 MG capsule Take 5 mg by mouth 2 (two) times daily.     tadalafil  (CIALIS ) 20 MG tablet TAKE 1 TABLET(20 MG) BY MOUTH DAILY AS NEEDED FOR ERECTILE DYSFUNCTION 15 tablet 1   No current facility-administered medications on file prior to visit.    Allergies:   Allergies  Allergen Reactions   Pravastatin Other (See Comments)      OBJECTIVE:  Physical Exam  Vitals:   11/06/24 1524  BP: (!) 142/62  Pulse: 87  Weight: 174 lb (78.9 kg)  Height: 5' 5 (1.651 m)    Body mass index is 28.96 kg/m. No results found.  General: well developed, well nourished, very pleasant elderly Caucasian male, seated, in no evident distress  Neurologic Exam Mental Status: Awake and fully alert. Oriented to place and time. Recent and remote memory intact. Attention span, concentration and fund of knowledge appropriate. Mood and affect appropriate.  Cranial Nerves: Pupils equal, briskly reactive to light. Extraocular movements full without nystagmus. Visual fields full to confrontation. Hearing intact.  Facial sensation intact. Face, tongue, palate moves normally and symmetrically.  Motor: Normal bulk and tone. Normal strength in all tested extremity muscles Gait and Station: Arises from chair without difficulty. Stance is normal. Gait demonstrates normal stride length and balance      HST 11/03/2020 Per Dr. Chalice: IMPRESSION: OSA (obstructive sleep apnea)  Summary & Diagnosis:      This HST confirms the presence of severe OSA - the AHI here was  48/h, the REM AHI was 59/h. The patient spent 32.7 minutes in  hypoxia and all night in supine position.          ASSESSMENT/PLAN: Josel Keo is a 78 y.o. year old male    Complex sleep apnea syndrome  -Compliance report shows excellent usage and optimal residual AHI -Continue current settings of 7-17 with EPR 3 -Continue nightly usage for adequate CPAP management -Routinely follow with DME company [employer] for any needed supplies or CPAP related concerns - CPAP setup 08/2021    Orders Placed This Encounter  Procedures   For home use only DME continuous positive airway pressure (CPAP)    DME [employer]  Continue current pressure settings of 7-17 with EPR 3 Continue to provide supplies as indicated Please follow-up with patient regarding chinstrap concerns - having issues finding one where the color does not bleed onto pillow    Length of Need:   Lifetime    Patient has OSA or probable OSA:   Yes    Is the patient currently using CPAP in the home:   Yes    If yes (to question two):   Determine DME provider and inform them of any new orders/settings    Date of face to face encounter:   01/26/2024    Settings:   Autotitration    CPAP supplies needed:   Mask, headgear, cushions, filters, heated tubing and water chamber       Follow up in 1 year or call earlier if needed     Harlene Bogaert, AGNP-BC  [facility] [address] [place], [place] [address]  Phone [phone]  Fax [phone] Note: This document was prepared with digital dictation and possible smart phrase technology. Any transcriptional errors that result from this process are unintentional.

## 2024-11-12 DIAGNOSIS — R972 Elevated prostate specific antigen [PSA]: Secondary | ICD-10-CM | POA: Diagnosis not present

## 2025-01-02 ENCOUNTER — Encounter (INDEPENDENT_AMBULATORY_CARE_PROVIDER_SITE_OTHER): Admitting: Ophthalmology

## 2025-01-02 DIAGNOSIS — E113591 Type 2 diabetes mellitus with proliferative diabetic retinopathy without macular edema, right eye: Secondary | ICD-10-CM | POA: Diagnosis not present

## 2025-01-02 DIAGNOSIS — I10 Essential (primary) hypertension: Secondary | ICD-10-CM | POA: Diagnosis not present

## 2025-01-02 DIAGNOSIS — H35033 Hypertensive retinopathy, bilateral: Secondary | ICD-10-CM | POA: Diagnosis not present

## 2025-01-02 DIAGNOSIS — E113512 Type 2 diabetes mellitus with proliferative diabetic retinopathy with macular edema, left eye: Secondary | ICD-10-CM | POA: Diagnosis not present

## 2025-01-02 DIAGNOSIS — H34812 Central retinal vein occlusion, left eye, with macular edema: Secondary | ICD-10-CM | POA: Diagnosis not present

## 2025-01-02 DIAGNOSIS — Z794 Long term (current) use of insulin: Secondary | ICD-10-CM

## 2025-01-02 DIAGNOSIS — H43813 Vitreous degeneration, bilateral: Secondary | ICD-10-CM | POA: Diagnosis not present

## 2025-01-03 ENCOUNTER — Telehealth: Payer: Self-pay | Admitting: Psychiatry

## 2025-01-03 NOTE — Telephone Encounter (Signed)
 Next visit is 01/15/25. Requesting refill on Methylphenidate  36 mg called to:   [facility] - [place], [place] - [address] [facility] AT Portal to Reg[facility]    Phone: [phone]  Fax: [phone]

## 2025-01-04 ENCOUNTER — Other Ambulatory Visit: Payer: Self-pay

## 2025-01-04 DIAGNOSIS — G4730 Sleep apnea, unspecified: Secondary | ICD-10-CM

## 2025-01-04 MED ORDER — METHYLPHENIDATE HCL ER (OSM) 36 MG PO TBCR: 36.0000 mg | EXTENDED_RELEASE_TABLET | Freq: Every day | ORAL | 0 refills | Status: AC

## 2025-01-04 NOTE — Telephone Encounter (Signed)
 Pended

## 2025-01-04 NOTE — Telephone Encounter (Signed)
 Next visit is 01/15/25. Requesting refill on Methylphenidate  36 mg called to:   [facility] - [place], [place] - [address] [facility] AT Portal to Reg[facility]    Phone: [phone]  Fax: [phone]

## 2025-01-15 ENCOUNTER — Ambulatory Visit: Admitting: Psychiatry

## 2025-03-06 ENCOUNTER — Encounter (INDEPENDENT_AMBULATORY_CARE_PROVIDER_SITE_OTHER): Admitting: Ophthalmology

## 2025-11-07 ENCOUNTER — Telehealth: Admitting: Adult Health
